# Patient Record
Sex: Male | Born: 1935 | Race: White | Hispanic: No | Smoking: Never smoker
Health system: Southern US, Community
[De-identification: ages and names within clinical notes are randomized; demographics above are authoritative.]

## PROBLEM LIST (undated history)

## (undated) DIAGNOSIS — F5104 Psychophysiologic insomnia: Secondary | ICD-10-CM

## (undated) DIAGNOSIS — H9190 Unspecified hearing loss, unspecified ear: Secondary | ICD-10-CM

## (undated) DIAGNOSIS — L409 Psoriasis, unspecified: Secondary | ICD-10-CM

## (undated) DIAGNOSIS — G629 Polyneuropathy, unspecified: Secondary | ICD-10-CM

## (undated) DIAGNOSIS — R918 Other nonspecific abnormal finding of lung field: Secondary | ICD-10-CM

## (undated) DIAGNOSIS — K219 Gastro-esophageal reflux disease without esophagitis: Secondary | ICD-10-CM

## (undated) DIAGNOSIS — M51379 Other intervertebral disc degeneration, lumbosacral region without mention of lumbar back pain or lower extremity pain: Secondary | ICD-10-CM

## (undated) DIAGNOSIS — K5790 Diverticulosis of intestine, part unspecified, without perforation or abscess without bleeding: Secondary | ICD-10-CM

## (undated) DIAGNOSIS — D509 Iron deficiency anemia, unspecified: Secondary | ICD-10-CM

## (undated) DIAGNOSIS — M5137 Other intervertebral disc degeneration, lumbosacral region: Secondary | ICD-10-CM

## (undated) DIAGNOSIS — M1710 Unilateral primary osteoarthritis, unspecified knee: Secondary | ICD-10-CM

## (undated) DIAGNOSIS — C61 Malignant neoplasm of prostate: Secondary | ICD-10-CM

## (undated) DIAGNOSIS — E785 Hyperlipidemia, unspecified: Secondary | ICD-10-CM

## (undated) DIAGNOSIS — K279 Peptic ulcer, site unspecified, unspecified as acute or chronic, without hemorrhage or perforation: Secondary | ICD-10-CM

## (undated) DIAGNOSIS — E559 Vitamin D deficiency, unspecified: Secondary | ICD-10-CM

## (undated) DIAGNOSIS — E119 Type 2 diabetes mellitus without complications: Secondary | ICD-10-CM

## (undated) DIAGNOSIS — G2581 Restless legs syndrome: Secondary | ICD-10-CM

## (undated) DIAGNOSIS — E7800 Pure hypercholesterolemia, unspecified: Secondary | ICD-10-CM

## (undated) DIAGNOSIS — M519 Unspecified thoracic, thoracolumbar and lumbosacral intervertebral disc disorder: Secondary | ICD-10-CM

## (undated) DIAGNOSIS — G4700 Insomnia, unspecified: Secondary | ICD-10-CM

## (undated) DIAGNOSIS — F419 Anxiety disorder, unspecified: Secondary | ICD-10-CM

## (undated) DIAGNOSIS — M5416 Radiculopathy, lumbar region: Secondary | ICD-10-CM

## (undated) DIAGNOSIS — K2950 Unspecified chronic gastritis without bleeding: Secondary | ICD-10-CM

## (undated) DIAGNOSIS — I10 Essential (primary) hypertension: Secondary | ICD-10-CM

## (undated) DIAGNOSIS — E039 Hypothyroidism, unspecified: Secondary | ICD-10-CM

---
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
## 1978-09-17 HISTORY — PX: HEMORROIDECTOMY: SUR656

## 1997-09-17 HISTORY — PX: CARPAL TUNNEL RELEASE: SHX101

## 1998-02-24 ENCOUNTER — Ambulatory Visit (HOSPITAL_BASED_OUTPATIENT_CLINIC_OR_DEPARTMENT_OTHER): Admission: RE | Admit: 1998-02-24 | Discharge: 1998-02-24 | Payer: Self-pay | Admitting: *Deleted

## 1998-03-25 ENCOUNTER — Ambulatory Visit (HOSPITAL_BASED_OUTPATIENT_CLINIC_OR_DEPARTMENT_OTHER): Admission: RE | Admit: 1998-03-25 | Discharge: 1998-03-25 | Payer: Self-pay | Admitting: *Deleted

## 2003-05-31 ENCOUNTER — Ambulatory Visit: Admission: RE | Admit: 2003-05-31 | Discharge: 2003-07-17 | Payer: Self-pay | Admitting: Radiation Oncology

## 2003-06-10 ENCOUNTER — Encounter: Payer: Self-pay | Admitting: Emergency Medicine

## 2003-06-10 ENCOUNTER — Emergency Department (HOSPITAL_COMMUNITY): Admission: EM | Admit: 2003-06-10 | Discharge: 2003-06-10 | Payer: Self-pay | Admitting: Emergency Medicine

## 2003-07-28 ENCOUNTER — Encounter: Admission: RE | Admit: 2003-07-28 | Discharge: 2003-07-28 | Payer: Self-pay | Admitting: Family Medicine

## 2003-08-24 ENCOUNTER — Encounter: Admission: RE | Admit: 2003-08-24 | Discharge: 2003-08-24 | Payer: Self-pay | Admitting: Family Medicine

## 2003-09-18 HISTORY — PX: PROSTATECTOMY: SHX69

## 2003-09-18 HISTORY — PX: ANKLE SURGERY: SHX546

## 2003-09-24 ENCOUNTER — Encounter (INDEPENDENT_AMBULATORY_CARE_PROVIDER_SITE_OTHER): Payer: Self-pay | Admitting: Specialist

## 2003-09-24 ENCOUNTER — Inpatient Hospital Stay (HOSPITAL_COMMUNITY): Admission: RE | Admit: 2003-09-24 | Discharge: 2003-09-26 | Payer: Self-pay | Admitting: Urology

## 2004-08-16 ENCOUNTER — Ambulatory Visit (HOSPITAL_BASED_OUTPATIENT_CLINIC_OR_DEPARTMENT_OTHER): Admission: RE | Admit: 2004-08-16 | Discharge: 2004-08-16 | Payer: Self-pay | Admitting: Surgery

## 2004-08-16 ENCOUNTER — Ambulatory Visit (HOSPITAL_COMMUNITY): Admission: RE | Admit: 2004-08-16 | Discharge: 2004-08-16 | Payer: Self-pay | Admitting: Surgery

## 2005-09-17 HISTORY — PX: ARTHROSCOPY KNEE W/ DRILLING: SUR92

## 2006-09-17 HISTORY — PX: SQUAMOUS CELL CARCINOMA EXCISION: SHX2433

## 2006-12-16 ENCOUNTER — Encounter: Admission: RE | Admit: 2006-12-16 | Discharge: 2006-12-16 | Payer: Self-pay | Admitting: Family Medicine

## 2007-02-26 ENCOUNTER — Encounter: Admission: RE | Admit: 2007-02-26 | Discharge: 2007-02-26 | Payer: Self-pay | Admitting: Urology

## 2007-02-28 ENCOUNTER — Encounter (INDEPENDENT_AMBULATORY_CARE_PROVIDER_SITE_OTHER): Payer: Self-pay | Admitting: Urology

## 2007-02-28 ENCOUNTER — Ambulatory Visit (HOSPITAL_BASED_OUTPATIENT_CLINIC_OR_DEPARTMENT_OTHER): Admission: RE | Admit: 2007-02-28 | Discharge: 2007-02-28 | Payer: Self-pay | Admitting: Urology

## 2007-05-03 ENCOUNTER — Encounter: Admission: RE | Admit: 2007-05-03 | Discharge: 2007-05-03 | Payer: Self-pay | Admitting: Family Medicine

## 2007-05-09 ENCOUNTER — Encounter: Admission: RE | Admit: 2007-05-09 | Discharge: 2007-05-09 | Payer: Self-pay | Admitting: Family Medicine

## 2007-05-26 ENCOUNTER — Encounter: Admission: RE | Admit: 2007-05-26 | Discharge: 2007-05-26 | Payer: Self-pay | Admitting: Family Medicine

## 2007-10-03 ENCOUNTER — Encounter: Admission: RE | Admit: 2007-10-03 | Discharge: 2007-10-03 | Payer: Self-pay | Admitting: Family Medicine

## 2008-05-05 ENCOUNTER — Ambulatory Visit: Payer: Self-pay | Admitting: Gastroenterology

## 2008-05-18 ENCOUNTER — Ambulatory Visit: Payer: Self-pay | Admitting: Gastroenterology

## 2008-05-29 ENCOUNTER — Emergency Department (HOSPITAL_BASED_OUTPATIENT_CLINIC_OR_DEPARTMENT_OTHER): Admission: EM | Admit: 2008-05-29 | Discharge: 2008-05-29 | Payer: Self-pay | Admitting: Emergency Medicine

## 2010-03-30 ENCOUNTER — Inpatient Hospital Stay (HOSPITAL_COMMUNITY): Admission: AD | Admit: 2010-03-30 | Discharge: 2010-04-02 | Payer: Self-pay

## 2010-12-02 LAB — LIPASE, BLOOD: Lipase: 37 U/L (ref 11–59)

## 2010-12-02 LAB — URINALYSIS, ROUTINE W REFLEX MICROSCOPIC
Bilirubin Urine: NEGATIVE
Glucose, UA: >1000 mg/dL — AB
Hgb urine dipstick: NEGATIVE
Ketones, ur: 15 mg/dL — AB
Leukocytes, UA: NEGATIVE
Nitrite: NEGATIVE
Protein, ur: NEGATIVE mg/dL
Specific Gravity, Urine: 1.025 (ref 1.005–1.030)
Urobilinogen, UA: 1 mg/dL (ref 0.0–1.0)
pH: 6 (ref 5.0–8.0)

## 2010-12-02 LAB — URINE MICROSCOPIC-ADD ON

## 2010-12-02 LAB — FECAL LACTOFERRIN, QUANT: Fecal Lactoferrin: POSITIVE

## 2010-12-02 LAB — CBC
HCT: 36.3 % — ABNORMAL LOW (ref 39.0–52.0)
Hemoglobin: 12.3 g/dL — ABNORMAL LOW (ref 13.0–17.0)
MCH: 31.3 pg (ref 26.0–34.0)
MCH: 31.7 pg (ref 26.0–34.0)
MCHC: 33.7 g/dL (ref 30.0–36.0)
MCHC: 34.3 g/dL (ref 30.0–36.0)
MCV: 92.5 fL (ref 78.0–100.0)
MCV: 92.7 fL (ref 78.0–100.0)
Platelets: 138 10*3/uL — ABNORMAL LOW (ref 150–400)
Platelets: 161 10*3/uL (ref 150–400)
RBC: 3.19 MIL/uL — ABNORMAL LOW (ref 4.22–5.81)
RBC: 3.92 MIL/uL — ABNORMAL LOW (ref 4.22–5.81)
RDW: 12.9 % (ref 11.5–15.5)
RDW: 12.9 % (ref 11.5–15.5)
WBC: 12.3 10*3/uL — ABNORMAL HIGH (ref 4.0–10.5)
WBC: 14.2 10*3/uL — ABNORMAL HIGH (ref 4.0–10.5)
WBC: 8.2 10*3/uL (ref 4.0–10.5)

## 2010-12-02 LAB — GLUCOSE, CAPILLARY
Glucose-Capillary: 171 mg/dL — ABNORMAL HIGH (ref 70–99)
Glucose-Capillary: 195 mg/dL — ABNORMAL HIGH (ref 70–99)
Glucose-Capillary: 216 mg/dL — ABNORMAL HIGH (ref 70–99)
Glucose-Capillary: 221 mg/dL — ABNORMAL HIGH (ref 70–99)
Glucose-Capillary: 230 mg/dL — ABNORMAL HIGH (ref 70–99)
Glucose-Capillary: 260 mg/dL — ABNORMAL HIGH (ref 70–99)
Glucose-Capillary: 271 mg/dL — ABNORMAL HIGH (ref 70–99)

## 2010-12-02 LAB — BASIC METABOLIC PANEL
BUN: 12 mg/dL (ref 6–23)
BUN: 23 mg/dL (ref 6–23)
BUN: 39 mg/dL — ABNORMAL HIGH (ref 6–23)
CO2: 39 mEq/L — ABNORMAL HIGH (ref 19–32)
Calcium: 8.9 mg/dL (ref 8.4–10.5)
Chloride: 85 mEq/L — ABNORMAL LOW (ref 96–112)
Creatinine, Ser: 1.02 mg/dL (ref 0.4–1.5)
Creatinine, Ser: 1.13 mg/dL (ref 0.4–1.5)
Creatinine, Ser: 1.29 mg/dL (ref 0.4–1.5)
GFR calc Af Amer: >60 mL/min (ref >60)
GFR calc Af Amer: >60 mL/min (ref >60)
GFR calc non Af Amer: 55 mL/min — ABNORMAL LOW (ref >60)
GFR calc non Af Amer: >60 mL/min (ref >60)
GFR calc non Af Amer: >60 mL/min (ref >60)
Glucose, Bld: 290 mg/dL — ABNORMAL HIGH (ref 70–99)
Potassium: 3 mEq/L — ABNORMAL LOW (ref 3.5–5.1)
Potassium: 3.6 mEq/L (ref 3.5–5.1)
Sodium: 134 mEq/L — ABNORMAL LOW (ref 135–145)

## 2010-12-02 LAB — HEMOCCULT GUIAC POC 1CARD (OFFICE): Fecal Occult Bld: POSITIVE

## 2010-12-02 LAB — STOOL CULTURE

## 2010-12-02 LAB — GIARDIA/CRYPTOSPORIDIUM SCREEN(EIA)
Cryptosporidium Screen (EIA): NEGATIVE
Giardia Screen - EIA: NEGATIVE

## 2010-12-02 LAB — AMYLASE: Amylase: 45 U/L (ref 0–105)

## 2010-12-03 LAB — CARDIAC PANEL(CRET KIN+CKTOT+MB+TROPI)
CK, MB: 1.3 ng/mL (ref 0.3–4.0)
Relative Index: INVALID (ref 0.0–2.5)
Total CK: 42 U/L (ref 7–232)

## 2010-12-03 LAB — COMPREHENSIVE METABOLIC PANEL
ALT: 17 U/L (ref 0–53)
AST: 21 U/L (ref 0–37)
Albumin: 4 g/dL (ref 3.5–5.2)
Alkaline Phosphatase: 77 U/L (ref 39–117)
BUN: 46 mg/dL — ABNORMAL HIGH (ref 6–23)
CO2: 35 mEq/L — ABNORMAL HIGH (ref 19–32)
Calcium: 9.4 mg/dL (ref 8.4–10.5)
Chloride: 84 mEq/L — ABNORMAL LOW (ref 96–112)
Creatinine, Ser: 1.39 mg/dL (ref 0.4–1.5)
GFR calc Af Amer: >60 mL/min (ref >60)
GFR calc non Af Amer: 50 mL/min — ABNORMAL LOW (ref >60)
Glucose, Bld: 358 mg/dL — ABNORMAL HIGH (ref 70–99)
Potassium: 4 mEq/L (ref 3.5–5.1)
Sodium: 134 mEq/L — ABNORMAL LOW (ref 135–145)
Total Bilirubin: 2.1 mg/dL — ABNORMAL HIGH (ref 0.3–1.2)
Total Protein: 7.2 g/dL (ref 6.0–8.3)

## 2010-12-03 LAB — CBC
HCT: 41.1 % (ref 39.0–52.0)
Hemoglobin: 13.9 g/dL (ref 13.0–17.0)
MCH: 31.7 pg (ref 26.0–34.0)
MCHC: 34 g/dL (ref 30.0–36.0)
MCV: 93.3 fL (ref 78.0–100.0)
Platelets: 171 10*3/uL (ref 150–400)
RBC: 4.4 MIL/uL (ref 4.22–5.81)
RDW: 12.4 % (ref 11.5–15.5)
WBC: 12.4 10*3/uL — ABNORMAL HIGH (ref 4.0–10.5)

## 2010-12-03 LAB — CULTURE, BLOOD (ROUTINE X 2)
Culture: NO GROWTH
Culture: NO GROWTH

## 2010-12-03 LAB — GLUCOSE, CAPILLARY
Glucose-Capillary: 319 mg/dL — ABNORMAL HIGH (ref 70–99)
Glucose-Capillary: 340 mg/dL — ABNORMAL HIGH (ref 70–99)

## 2010-12-03 LAB — DIFFERENTIAL
Basophils Absolute: 0 10*3/uL (ref 0.0–0.1)
Eosinophils Relative: 0 % (ref 0–5)
Lymphocytes Relative: 9 % — ABNORMAL LOW (ref 12–46)
Monocytes Absolute: 1.1 10*3/uL — ABNORMAL HIGH (ref 0.1–1.0)

## 2010-12-03 LAB — HEMOGLOBIN A1C
Hgb A1c MFr Bld: 7.4 % — ABNORMAL HIGH (ref <5.7)
Mean Plasma Glucose: 166 mg/dL — ABNORMAL HIGH (ref <117)

## 2010-12-03 LAB — LIPID PANEL: Cholesterol: 148 mg/dL (ref 0–200)

## 2010-12-03 LAB — LACTIC ACID, PLASMA: Lactic Acid, Venous: 3.2 mmol/L — ABNORMAL HIGH (ref 0.5–2.2)

## 2011-01-30 NOTE — Op Note (Signed)
Roger Hanson, Roger Hanson               ACCOUNT NO.:  [id number]   MEDICAL RECORD NO.:  [id number]          PATIENT TYPE:  AMB   LOCATION:  [facility]                         FACILITY:  [facility]   PHYSICIAN:  Ronald L. Earlene Plater, M.D.  DATE OF BIRTH:  [birth year]   DATE OF PROCEDURE:  02/28/2007  DATE OF DISCHARGE:                               OPERATIVE REPORT   DIAGNOSIS:  Carcinoma in situ of the scrotum.   OPERATIVE PROCEDURE:  Partial scrotectomy with excision of the carcinoma  in situ and frozen section margins.   SURGEON:  Gaynelle Arabian, M.D.   ANESTHESIA:  LMA.   BLOOD LOSS:  Negligible.   TUBES:  None.   COMPLICATIONS:  None.   INDICATION FOR PROCEDURE:  [name] is a very nice 76 year old white  male who has history of prostate cancer.  He presented with a right  scrotal lesion and was biopsied by Dr. Karlyn Agee and found to have  carcinoma in situ.  The lesion was approximately 3.5 cm in diameter, was  well demarcated and we felt that after risks, benefits and alternatives  and excision of this lesion was indicated.   PROCEDURE IN DETAIL:  The patient was placed in the supine position and  after proper LMA anesthesia was prepped and draped with Betadine in  sterile fashion.  Marking pen was marked around lesion encompassing  approximately a 3 mm margin around it.  Incision was made through the  skin down to the dartos tunic and the lesion was excised with full-  thickness skin down to the dartos tunic.  It was subsequently sent to  pathology.  Good hemostasis noted to be present.  The frozen section did  reveal carcinoma in situ and it appeared on frozen section to have  negative margins.  The area was irrigated thoroughly and the wound was  closed vertically.  Subcutaneous  stitches were placed with interrupted 4-0 chromic catgut.  The skin was  closed with a running 4-0 Vicryl suture with interspaced interrupted 4-0  Vicryl suture.  Wounds were dressed with fluffs and scrotal  support.  The patient tolerated procedure well, no complications.  He was taken to  the recovery room stable.      Ronald L. Earlene Plater, M.D.  Electronically Signed     RLD/MEDQ  D:  02/28/2007  T:  02/28/2007  Job:  [id number]   cc:   Garrison [place]. Yetta Barre, M.D.  Fax: [phone]

## 2011-02-02 NOTE — Op Note (Signed)
Roger Hanson, Roger Hanson               ACCOUNT NO.:  [id number]   MEDICAL RECORD NO.:  [id number]          PATIENT TYPE:  AMB   LOCATION:  [facility]                          FACILITY:  [facility]   PHYSICIAN:  Currie Paris, M.D.DATE OF BIRTH:  12/21/1934   DATE OF PROCEDURE:  08/16/2004  DATE OF DISCHARGE:                                 OPERATIVE REPORT   CCS:  #[id number]   PREOPERATIVE DIAGNOSIS:  Left inguinal hernia.   POSTOPERATIVE DIAGNOSIS:  Left inguinal hernia - indirect.   OPERATION:  Repair of left inguinal hernia with mesh.   SURGEON:  Currie Paris, M.D.   ASSISTANT:  Abbott Pao, P.A.-S.   ANESTHESIA:  General (LMA).   INDICATIONS FOR PROCEDURE:  This patient is a 76 year old gentleman who  presented with a symptomatic left inguinal hernia that he desired to have  repaired.   DESCRIPTION OF PROCEDURE:  The patient was seen in the holding area and had  no further questions.  The left inguinal area was marked by the patient and  by me as the operative site.  He was taken to the operating room, and after  satisfactory general LMA anesthesia was obtained, the groin area was  clipped, prepped and draped.  I used 0.25% Marcaine to help with  postoperative analgesia and infiltrated along the skin line and sub-  fascially.  The skin incision was made and deepened to the external oblique  aponeurosis, with additional local infiltration down in the deeper layers.  The external oblique aponeurosis was opened up in line with its fibers and  the cord structures separated off and surrounded with a Penrose drain.  Once I had the floor examined, it appeared intact.  There was a large amount  of pre-peritoneal fat protruding through, which was trimmed off.  The  external oblique was somewhat scarred up very medially and inferiorly from  its prior midline from his prostate surgery, so this was not freed up here.  There was an indirect sac present, which was stripped off the cord,  suture  ligated and amputated and retracted under the deep ring.  Some pre-  peritoneal fat was also amputated.  The deep ring was fairly patulous, but  there were no other structures other than now the cord structures coming  through.  Due to the repair, I took a #2-0 Prolene and tightened the internal ring by  putting a single suture through the transversalis, just medial to the deep  ring.  The mesh patch was then placed overlying, and sutured in with a  running #2-0 Prolene starting medially and working to the level of the deep  ring, and then tacked medially well under the external oblique using more #2-  0 Prolene.  It was split laterally to go around the cord and tacked down.  This produced a nice coverage with a deep ring which was snug but allowed  the cord structures to come through readily.  Everything appeared to be dry.  The external oblique was closed over the repair with #3-0 Vicryl, the  Scarpa's with #3-0 Vicryl, the skin with #4-0  Monocryl subcuticular and  Dermabond.  The patient tolerated the procedure well.  There were no operative  complications.  All counts are correct.      Chri   CJS/MEDQ  D:  08/16/2004  T:  08/16/2004  Job:  [id number]   cc:   Mosetta Putt, M.D.  [address] [place]  [place] [address]  Fax: [phone]

## 2011-02-02 NOTE — H&P (Signed)
NAME:  Roger Hanson, Roger Hanson                         ACCOUNT NO.:  [id number]   MEDICAL RECORD NO.:  [id number]                   PATIENT TYPE:  INP   LOCATION:  X001                                 FACILITY:  [facility]   PHYSICIAN:  Lucrezia Starch. Ovidio Hanger, M.D.           DATE OF BIRTH:  1935-02-23   DATE OF ADMISSION:  09/24/2003  DATE OF DISCHARGE:                                HISTORY & PHYSICAL   CHIEF COMPLAINT:  I have prostate cancer.   HISTORY OF PRESENT ILLNESS:  Mr. Parodi is a very nice 76 year old white  male who was referred by Dr. Mosetta Putt for a nodule in his prostate.  He was evaluated by me and on examination was found to have a firm left  nodule in the left central portion towards the apex of the prostate.  PSA  was noted to be 2.7.  He underwent transrectal ultrasound and biopsy of the  prostate which revealed a Gleason score 6 which was 3+3 adenocarcinoma  involving less than 10% of the biopsies from the left side of the prostate.  He has considered all options carefully.  After understanding risks,  benefits, and alternatives, has elected to proceed with radical retropubic  prostatectomy.   ALLERGIES:  He has no known allergies.   MEDICATIONS:  1. Glipizide.  2. Metformin.  3. Levothyroxine.  4. Cozaar.  5. Lovastatin.  6. Prevacid.   PAST MEDICAL HISTORY:  1. He does have hypertension.  2. Is diabetic.  3. Hypothyroid.  4. He has had a hiatal hernia with reflux.  5. Prostate cancer, as noted.   PAST SURGICAL HISTORY:  1. He has had a herniorrhaphy in 1976.  2. Bilateral carpal tunnel syndrome five years ago.  3. He has obtained 2 units of autologous blood.   SOCIAL HISTORY:  Negative smoker.  Negative drinker.   FAMILY HISTORY:  Not significant.   REVIEW OF SYSTEMS:  He has no shortness of breath, dyspnea on exertion,  chest pain, or GI complaints.   PHYSICAL EXAMINATION:  VITAL SIGNS:  Blood pressure 134/60, pulse 88,  respirations 18,  temperature 97.5 degrees Fahrenheit.  GENERAL:  He is well-nourished, well-developed, well groomed.  HEENT:  Normal.  NECK:  Without masses or thyromegaly.  CHEST:  Normal diaphragmatic motion.  ABDOMEN:  Soft, nontender without mass, organomegaly, or hernias.  HEART:  Normal sinus rhythm without murmurs or gallops.  EXTREMITIES:  Normal.  NEUROLOGIC:  Intact.  SKIN:  Normal.  GENITOURINARY:  Penis, meatus, scrotum, testicle, adnexa, anus, perineum are  normal.  Rectal vault is empty.  Prostate is 30 g.  Firm left central nodule  toward the apex.   IMPRESSION:  Clinically localized adenocarcinoma of the prostate.   PLAN:  Radical retropubic prostatectomy.  Ronald L. Ovidio Hanger, M.D.    RLD/MEDQ  D:  09/24/2003  T:  09/24/2003  Job:  [id number]

## 2011-02-02 NOTE — Op Note (Signed)
NAME:  Roger Hanson, Roger Hanson                         ACCOUNT NO.:  [id number]   MEDICAL RECORD NO.:  [id number]                   PATIENT TYPE:  INP   LOCATION:  X001                                 FACILITY:  [facility]   PHYSICIAN:  Lucrezia Starch. Ovidio Hanger, M.D.           DATE OF BIRTH:  1935/06/10   DATE OF PROCEDURE:  09/24/2003  DATE OF DISCHARGE:                                 OPERATIVE REPORT   DIAGNOSIS:  Adenocarcinoma of the prostate.   OPERATIVE PROCEDURE:  Radical retropubic prostatectomy.   SURGEON:  Lucrezia Starch. Earlene Plater, M.D.   ASSISTANT:  Lindaann Slough, M.D.   ANESTHESIA:  General endotracheal.   ESTIMATED BLOOD LOSS:  400 mL.   TUBES:  22 French 10 mL balloon Foley and large flat Blake drain.   COMPLICATIONS:  None.   INDICATION FOR PROCEDURE:  Roger Hanson is a very nice 76 year old white  male who presented with a prostate nodule.  He underwent transrectal  ultrasound and biopsy of the prostate revealed a Gleason score 6 from the  left nodular area of the prostate that appeared to be very localized.  PSA  was only 2.7.  He has considered all options.  After understanding the  risks, benefits, and alternatives, it was elected to proceed with radical  retropubic prostatectomy.   PROCEDURE IN DETAIL:  The patient was placed in the supine position, after  proper general endotracheal anesthesia was prepped and draped with Betadine  in a sterile fashion.  A 24 French, 30 mL balloon catheter was placed into  the bladder and the bladder was drained.  A lower midline vertical abdominal  incision was made.  Sharp dissection was carried down through the  subcutaneous tissue.  The linea alba was incised in the direction of the  incision.  The rectus muscle bellies were retracted laterally, and the space  of Retzius was entered and explored.  There was no unusual adenopathy noted  and with the very localized Gleason score 6 adenocarcinoma and the very low  PSA, we felt that  lymphadenectomy was probably not indicated.  The prostate  was then isolated.  The superficial dorsal vein of the penis was ligated  proximally and distally with 3-0 chromic catgut and ligated.  Endopelvic  fascia was perforated bilaterally.  The puboprostatic ligaments were sharply  incised.  The deep dorsal vein of the penis was encircled with a McDougal  retractor and ligated with two #1 Vicryl stitches and the back bleeder  utilizing the Stamey device was ligated with a figure-of-eight Vicryl  stitch.  The deep dorsal vein of the penis was then cut sharply with Bovie  coagulation cautery down to the apex of the prostate.  At this point an  ampule of indigo carmine was given IV.  The neurovascular bundles were  dissected from the urethra and the lateral pelvic fascia was taken down from  the prostate to drop the neurovascular bundles posterior  laterally.  The  urethra was then encircled with the McDougal retractor.  An umbilical tape  was placed behind it.  The anterior urethra was incised sharply with a  knife.  The catheter was delivered in the field and the posterior urethra  was excised down to the rectourethralis muscle.  The rectourethralis muscle  fibers were then carefully taken down from the apex of the prostate,  completely cleaning the apex of the prostate but staying external to  Denonvillier's fascia, down posteriorly to the transversalis fascia  overlying the seminal vessels and the ampulla of the vas deferens.  The  lateral pedicles of the prostate were then taken in packets with a right  angle clamp and large Hemoloc clips and taken down serially bilaterally.  The transversalis fascia was then incised over the ampullae of the vas  deferens and the seminal vesicles.  The ampullae of the vas deferens were  clipped with Hemoloc clips and cut, as were the seminal vesicles.  The  bladder neck was then approached, dissected carefully with Bovie coagulation  cautery, and a  mucosal tunnel was created to the prostate to completely  preserve the bladder neck.  A right angle was placed around the posterior  bladder neck.  The bladder neck mucosa was then incised and the specimen  removed and submitted to pathology.  Good hemostasis was noted to be  present.  The mucosa was plicated to the serosa with interrupted 4-0 chromic  catgut at the bladder neck, and one figure-of-eight stitch was placed to  slightly tighten it up distally in a tennis racquet-type fashion.  This  tightened it up to approximately a 0.5 cm diameter.  The urethrovesical  anastomosis was then performed over a 22 French, 10 mL balloon catheter  utilizing UR5 needles and 2-0 chromic catgut.  Stitches were placed in the 5  o'clock, 7 o'clock, 3 o'clock, 9 o'clock, and 12 o'clock positions, and a #1  Prolene was passed through the eyelet of the catheter, passed through the  bladder to serve as a safety stitch.  The bladder was then pulled down to  the urethra, the ties were tied, and the bladder was irrigated and no  leakage was noted, and good hemostasis was noted to be present.  The #1  Prolene was passed through the skin over a button.  A large flat Blake drain  was placed through a left stab incision and sutured in place with chromic  catgut.  Again thorough irrigation was performed, good hemostasis was noted  to be present.  The rectus muscle bellies were approximated in the midline  with interrupted 0 chromic catgut.  The fascia was closed with a running #1  PDS suture, the subcutaneous tissue was irrigated, the skin was closed with  skin staples, and the patient was taken to the recovery room stable.                                               Ronald L. Ovidio Hanger, M.D.    RLD/MEDQ  D:  09/24/2003  T:  09/24/2003  Job:  [id number]

## 2011-02-02 NOTE — Discharge Summary (Signed)
NAME:  Roger Hanson, Roger Hanson                         ACCOUNT NO.:  [id number]   MEDICAL RECORD NO.:  [id number]                   PATIENT TYPE:  INP   LOCATION:  0344                                 [facility]ACILITY[facility]   PHYSICIAN:  Lucrezia Starch. Ovidio Hanger, M.D.           DATE OF BIRTH:  Dec 25, 1934   DATE OF ADMISSION:  09/24/2003  DATE OF DISCHARGE:  09/26/2003                                 DISCHARGE SUMMARY   DIAGNOSIS:  Adenocarcinoma of the prostate.   PROCEDURE:  Radical retropubic prostatectomy on September 24, 2003.   HISTORY OF PRESENT ILLNESS:  Mr. Roger Hanson is a very nice 76 year old white  male who is referred by Dr. Mosetta Putt for a nodule in his prostate. He  was found to have a PSA of 2.7. He underwent biopsy of the prostate which  revealed a Gleason's 4-6, which was 3+3 adenocarcinoma on the left side of  the prostate. After understanding risks, benefits, and alternatives, he has  elected to proceed with radical retropubic prostatectomy.   PAST MEDICAL HISTORY/FAMILY HISTORY/SOCIAL HISTORY/REVIEW OF SYSTEMS:  Please see signed history and physical examination for full details.   PHYSICAL EXAMINATION:  VITAL SIGNS:  He was afebrile. Vital signs stable.  GENERAL:  Well-nourished, well-____________.  HEENT:  Normal.  NECK:  Without masses or thyromegaly.  CHEST:  Has normal diaphragmatic motion.  ABDOMEN:  Soft, nontender. Without masses, organomegaly or hernias.  EXTREMITIES:  Normal.  NEUROLOGIC:  Intact.  SKIN:  Normal.  GENITOURINARY:  Penis, meatus, scrotum, testicle, adnexa, anus and perineum  are normal.  RECTAL:  Prostate is 30 grams, confirmed left central nodule towards the  apex.   HOSPITAL COURSE:  The patient was admitted after undergoing proper  preoperative evaluation. Was subsequently taken to surgery on September 24, 2003 and underwent radical retropubic prostatectomy uneventfully.  Immediately postoperatively, his lab tests were essentially normal. He was  comfortable and tolerating liquids. On postoperative day 1, which was  September 25, 2003 he was afebrile and comfortable. Abdomen was soft.  Hemoglobin was 10.8, hematocrit 32.0. White blood cell count was 8.4. B-met  was essentially normal. His liquids were increased and he was ambulated. He  was subsequently discharged on September 26, 2003, comfortable. CBG was okay.  Abdomen was soft. Wounds were clean. He was tolerating a diet. Flatus was  noted. The Badger Lee drain was discontinued.   DISCHARGE MEDICATIONS:  Include his prior home medications plus Levaquin and  Tylox.   CONDITION ON DISCHARGE:  Improved.   FOLLOW UP:  His pathology was pending. Instructions were given and he was to  see me the following week for staple removal.                                               Ronald L. Earlene Plater  III, M.D.    RLD/MEDQ  D:  10/06/2003  T:  10/07/2003  Job:  [id number]   cc:   Mosetta Putt, M.D.  [address] [place]  [place] [address]  Fax: [phone]

## 2011-05-14 ENCOUNTER — Other Ambulatory Visit: Payer: Self-pay | Admitting: Family Medicine

## 2011-05-14 DIAGNOSIS — M5136 Other intervertebral disc degeneration, lumbar region: Secondary | ICD-10-CM

## 2011-05-16 ENCOUNTER — Other Ambulatory Visit: Payer: Self-pay | Admitting: Family Medicine

## 2011-05-16 DIAGNOSIS — M5136 Other intervertebral disc degeneration, lumbar region: Secondary | ICD-10-CM

## 2011-05-16 DIAGNOSIS — M549 Dorsalgia, unspecified: Secondary | ICD-10-CM

## 2011-05-17 ENCOUNTER — Other Ambulatory Visit: Payer: Self-pay | Admitting: Family Medicine

## 2011-05-17 DIAGNOSIS — R911 Solitary pulmonary nodule: Secondary | ICD-10-CM

## 2011-05-18 ENCOUNTER — Ambulatory Visit
Admission: RE | Admit: 2011-05-18 | Discharge: 2011-05-18 | Disposition: A | Discharge status: Home / Self Care | Payer: Medicare Other | Source: Ambulatory Visit | Attending: Family Medicine | Admitting: Family Medicine

## 2011-05-18 DIAGNOSIS — M5136 Other intervertebral disc degeneration, lumbar region: Secondary | ICD-10-CM

## 2011-05-22 ENCOUNTER — Ambulatory Visit
Admission: RE | Admit: 2011-05-22 | Discharge: 2011-05-22 | Disposition: A | Discharge status: Home / Self Care | Payer: Medicare Other | Source: Ambulatory Visit | Attending: Family Medicine | Admitting: Family Medicine

## 2011-05-22 DIAGNOSIS — M5136 Other intervertebral disc degeneration, lumbar region: Secondary | ICD-10-CM

## 2011-05-22 DIAGNOSIS — M549 Dorsalgia, unspecified: Secondary | ICD-10-CM

## 2011-05-22 MED ORDER — METHYLPREDNISOLONE ACETATE 40 MG/ML INJ SUSP (RADIOLOG
120.0000 mg | Freq: Once | INTRAMUSCULAR | Status: AC
  Administered 2011-05-22: 120 mg via EPIDURAL

## 2011-05-22 MED ORDER — IOHEXOL 180 MG/ML  SOLN
1.0000 mL | Freq: Once | INTRAMUSCULAR | Status: AC | PRN
  Administered 2011-05-22: 1 mL via EPIDURAL

## 2011-05-29 ENCOUNTER — Ambulatory Visit
Admission: RE | Admit: 2011-05-29 | Discharge: 2011-05-29 | Disposition: A | Discharge status: Home / Self Care | Payer: Medicare Other | Source: Ambulatory Visit | Attending: Family Medicine | Admitting: Family Medicine

## 2011-05-29 DIAGNOSIS — R911 Solitary pulmonary nodule: Secondary | ICD-10-CM

## 2011-06-19 ENCOUNTER — Other Ambulatory Visit: Payer: Self-pay | Admitting: Family Medicine

## 2011-06-19 DIAGNOSIS — M5416 Radiculopathy, lumbar region: Secondary | ICD-10-CM

## 2011-06-20 LAB — URINALYSIS, ROUTINE W REFLEX MICROSCOPIC
Bilirubin Urine: NEGATIVE
Glucose, UA: 250 — AB
Ketones, ur: 15 — AB
Specific Gravity, Urine: 1.014
pH: 6

## 2011-06-20 LAB — URINE MICROSCOPIC-ADD ON

## 2011-06-21 ENCOUNTER — Ambulatory Visit
Admission: RE | Admit: 2011-06-21 | Discharge: 2011-06-21 | Disposition: A | Discharge status: Home / Self Care | Payer: Medicare Other | Source: Ambulatory Visit | Attending: Family Medicine | Admitting: Family Medicine

## 2011-06-21 DIAGNOSIS — M5416 Radiculopathy, lumbar region: Secondary | ICD-10-CM

## 2011-06-21 MED ORDER — IOHEXOL 180 MG/ML  SOLN
1.0000 mL | Freq: Once | INTRAMUSCULAR | Status: AC | PRN
  Administered 2011-06-21: 1 mL via EPIDURAL

## 2011-06-21 MED ORDER — METHYLPREDNISOLONE ACETATE 40 MG/ML INJ SUSP (RADIOLOG
120.0000 mg | Freq: Once | INTRAMUSCULAR | Status: AC
  Administered 2011-06-21: 120 mg via EPIDURAL

## 2011-07-05 LAB — URINALYSIS, ROUTINE W REFLEX MICROSCOPIC
Hgb urine dipstick: NEGATIVE
Nitrite: NEGATIVE
Protein, ur: NEGATIVE
Specific Gravity, Urine: 1.029
Urobilinogen, UA: 1

## 2011-07-05 LAB — COMPREHENSIVE METABOLIC PANEL
ALT: 23
AST: 21
Alkaline Phosphatase: 82
CO2: 26
Calcium: 10.1
Chloride: 110
GFR calc Af Amer: 57 — ABNORMAL LOW
GFR calc non Af Amer: 47 — ABNORMAL LOW
Potassium: 4.5
Sodium: 143

## 2011-07-05 LAB — CBC
MCHC: 33.6
RBC: 4.53
WBC: 8.5

## 2011-09-14 ENCOUNTER — Other Ambulatory Visit: Payer: Self-pay | Admitting: Family Medicine

## 2011-09-14 ENCOUNTER — Ambulatory Visit
Admission: RE | Admit: 2011-09-14 | Discharge: 2011-09-14 | Disposition: A | Discharge status: Home / Self Care | Payer: Medicare Other | Source: Ambulatory Visit | Attending: Family Medicine | Admitting: Family Medicine

## 2011-09-14 DIAGNOSIS — M25519 Pain in unspecified shoulder: Secondary | ICD-10-CM

## 2011-09-18 HISTORY — PX: LUMBAR FUSION: SHX111

## 2012-02-04 ENCOUNTER — Ambulatory Visit
Admission: RE | Admit: 2012-02-04 | Discharge: 2012-02-04 | Disposition: A | Discharge status: Home / Self Care | Payer: Medicare Other | Source: Ambulatory Visit | Attending: Urology | Admitting: Urology

## 2012-02-04 ENCOUNTER — Other Ambulatory Visit: Payer: Self-pay | Admitting: Urology

## 2012-02-04 DIAGNOSIS — N3946 Mixed incontinence: Secondary | ICD-10-CM | POA: Insufficient documentation

## 2012-02-04 DIAGNOSIS — C61 Malignant neoplasm of prostate: Secondary | ICD-10-CM | POA: Insufficient documentation

## 2012-05-26 ENCOUNTER — Other Ambulatory Visit: Payer: Self-pay | Admitting: Family Medicine

## 2012-05-26 DIAGNOSIS — R918 Other nonspecific abnormal finding of lung field: Secondary | ICD-10-CM

## 2012-05-28 ENCOUNTER — Ambulatory Visit
Admission: RE | Admit: 2012-05-28 | Discharge: 2012-05-28 | Disposition: A | Discharge status: Home / Self Care | Payer: Medicare Other | Source: Ambulatory Visit | Attending: Family Medicine | Admitting: Family Medicine

## 2012-05-28 DIAGNOSIS — R918 Other nonspecific abnormal finding of lung field: Secondary | ICD-10-CM

## 2013-04-20 ENCOUNTER — Encounter: Payer: Self-pay | Admitting: Gastroenterology

## 2013-08-07 ENCOUNTER — Encounter (HOSPITAL_BASED_OUTPATIENT_CLINIC_OR_DEPARTMENT_OTHER): Payer: Self-pay | Admitting: Emergency Medicine

## 2013-08-07 ENCOUNTER — Inpatient Hospital Stay (HOSPITAL_BASED_OUTPATIENT_CLINIC_OR_DEPARTMENT_OTHER)
Admission: EM | Admit: 2013-08-07 | Discharge: 2013-08-09 | DRG: 392 | Disposition: A | Discharge status: Home / Self Care | Payer: Medicare Other | Attending: Internal Medicine | Admitting: Internal Medicine

## 2013-08-07 ENCOUNTER — Emergency Department (HOSPITAL_BASED_OUTPATIENT_CLINIC_OR_DEPARTMENT_OTHER): Payer: Medicare Other

## 2013-08-07 DIAGNOSIS — K529 Noninfective gastroenteritis and colitis, unspecified: Secondary | ICD-10-CM

## 2013-08-07 DIAGNOSIS — E559 Vitamin D deficiency, unspecified: Secondary | ICD-10-CM | POA: Diagnosis present

## 2013-08-07 DIAGNOSIS — Z87891 Personal history of nicotine dependence: Secondary | ICD-10-CM

## 2013-08-07 DIAGNOSIS — E876 Hypokalemia: Secondary | ICD-10-CM | POA: Diagnosis present

## 2013-08-07 DIAGNOSIS — K5289 Other specified noninfective gastroenteritis and colitis: Secondary | ICD-10-CM

## 2013-08-07 DIAGNOSIS — I959 Hypotension, unspecified: Secondary | ICD-10-CM

## 2013-08-07 DIAGNOSIS — H9190 Unspecified hearing loss, unspecified ear: Secondary | ICD-10-CM | POA: Diagnosis present

## 2013-08-07 DIAGNOSIS — R1110 Vomiting, unspecified: Secondary | ICD-10-CM

## 2013-08-07 DIAGNOSIS — E119 Type 2 diabetes mellitus without complications: Secondary | ICD-10-CM

## 2013-08-07 DIAGNOSIS — I10 Essential (primary) hypertension: Secondary | ICD-10-CM | POA: Diagnosis present

## 2013-08-07 DIAGNOSIS — E873 Alkalosis: Secondary | ICD-10-CM

## 2013-08-07 DIAGNOSIS — G2581 Restless legs syndrome: Secondary | ICD-10-CM | POA: Diagnosis present

## 2013-08-07 DIAGNOSIS — E860 Dehydration: Secondary | ICD-10-CM | POA: Diagnosis present

## 2013-08-07 DIAGNOSIS — A088 Other specified intestinal infections: Principal | ICD-10-CM | POA: Diagnosis present

## 2013-08-07 DIAGNOSIS — E7800 Pure hypercholesterolemia, unspecified: Secondary | ICD-10-CM | POA: Diagnosis present

## 2013-08-07 DIAGNOSIS — M5137 Other intervertebral disc degeneration, lumbosacral region: Secondary | ICD-10-CM | POA: Diagnosis present

## 2013-08-07 DIAGNOSIS — A419 Sepsis, unspecified organism: Secondary | ICD-10-CM

## 2013-08-07 DIAGNOSIS — R7309 Other abnormal glucose: Secondary | ICD-10-CM

## 2013-08-07 DIAGNOSIS — K219 Gastro-esophageal reflux disease without esophagitis: Secondary | ICD-10-CM | POA: Diagnosis present

## 2013-08-07 DIAGNOSIS — R739 Hyperglycemia, unspecified: Secondary | ICD-10-CM

## 2013-08-07 DIAGNOSIS — Z8546 Personal history of malignant neoplasm of prostate: Secondary | ICD-10-CM

## 2013-08-07 DIAGNOSIS — N179 Acute kidney failure, unspecified: Secondary | ICD-10-CM

## 2013-08-07 DIAGNOSIS — E039 Hypothyroidism, unspecified: Secondary | ICD-10-CM | POA: Diagnosis present

## 2013-08-07 DIAGNOSIS — M51379 Other intervertebral disc degeneration, lumbosacral region without mention of lumbar back pain or lower extremity pain: Secondary | ICD-10-CM | POA: Diagnosis present

## 2013-08-07 DIAGNOSIS — D72829 Elevated white blood cell count, unspecified: Secondary | ICD-10-CM

## 2013-08-07 DIAGNOSIS — F411 Generalized anxiety disorder: Secondary | ICD-10-CM | POA: Diagnosis present

## 2013-08-07 DIAGNOSIS — L408 Other psoriasis: Secondary | ICD-10-CM | POA: Diagnosis present

## 2013-08-07 HISTORY — DX: Anxiety disorder, unspecified: F41.9

## 2013-08-07 HISTORY — DX: Gastro-esophageal reflux disease without esophagitis: K21.9

## 2013-08-07 HISTORY — DX: Unspecified hearing loss, unspecified ear: H91.90

## 2013-08-07 HISTORY — DX: Hypothyroidism, unspecified: E03.9

## 2013-08-07 HISTORY — DX: Pure hypercholesterolemia, unspecified: E78.00

## 2013-08-07 HISTORY — DX: Vitamin D deficiency, unspecified: E55.9

## 2013-08-07 HISTORY — DX: Other intervertebral disc degeneration, lumbosacral region: M51.37

## 2013-08-07 HISTORY — DX: Radiculopathy, lumbar region: M54.16

## 2013-08-07 HISTORY — DX: Other nonspecific abnormal finding of lung field: R91.8

## 2013-08-07 HISTORY — DX: Diverticulosis of intestine, part unspecified, without perforation or abscess without bleeding: K57.90

## 2013-08-07 HISTORY — DX: Malignant neoplasm of prostate: C61

## 2013-08-07 HISTORY — DX: Peptic ulcer, site unspecified, unspecified as acute or chronic, without hemorrhage or perforation: K27.9

## 2013-08-07 HISTORY — DX: Insomnia, unspecified: G47.00

## 2013-08-07 HISTORY — DX: Essential (primary) hypertension: I10

## 2013-08-07 HISTORY — DX: Psoriasis, unspecified: L40.9

## 2013-08-07 HISTORY — DX: Type 2 diabetes mellitus without complications: E11.9

## 2013-08-07 HISTORY — DX: Restless legs syndrome: G25.81

## 2013-08-07 HISTORY — DX: Other intervertebral disc degeneration, lumbosacral region without mention of lumbar back pain or lower extremity pain: M51.379

## 2013-08-07 LAB — CBC WITH DIFFERENTIAL/PLATELET
Basophils Absolute: 0 10*3/uL (ref 0.0–0.1)
Basophils Relative: 0 % (ref 0–1)
HCT: 45.5 % (ref 39.0–52.0)
Lymphocytes Relative: 4 % — ABNORMAL LOW (ref 12–46)
MCHC: 33.6 g/dL (ref 30.0–36.0)
Monocytes Absolute: 1.5 10*3/uL — ABNORMAL HIGH (ref 0.1–1.0)
Neutro Abs: 16.6 10*3/uL — ABNORMAL HIGH (ref 1.7–7.7)
Neutrophils Relative %: 88 % — ABNORMAL HIGH (ref 43–77)
Platelets: 175 10*3/uL (ref 150–400)
RDW: 13.4 % (ref 11.5–15.5)
WBC: 18.9 10*3/uL — ABNORMAL HIGH (ref 4.0–10.5)

## 2013-08-07 LAB — POCT I-STAT 3, VENOUS BLOOD GAS (G3P V)
TCO2: 44 mmol/L (ref 0–100)
pCO2, Ven: 54 mmHg — ABNORMAL HIGH (ref 45.0–50.0)
pH, Ven: 7.503 — ABNORMAL HIGH (ref 7.250–7.300)

## 2013-08-07 LAB — COMPREHENSIVE METABOLIC PANEL
ALT: 16 U/L (ref 0–53)
Alkaline Phosphatase: 104 U/L (ref 39–117)
BUN: 54 mg/dL — ABNORMAL HIGH (ref 6–23)
CO2: 37 mEq/L — ABNORMAL HIGH (ref 19–32)
GFR calc Af Amer: 25 mL/min — ABNORMAL LOW (ref >90)
GFR calc non Af Amer: 21 mL/min — ABNORMAL LOW (ref >90)
Glucose, Bld: 653 mg/dL (ref 70–99)
Potassium: 3.7 mEq/L (ref 3.5–5.1)
Sodium: 137 mEq/L (ref 135–145)
Total Bilirubin: 2 mg/dL — ABNORMAL HIGH (ref 0.3–1.2)
Total Protein: 7.6 g/dL (ref 6.0–8.3)

## 2013-08-07 LAB — URINE MICROSCOPIC-ADD ON

## 2013-08-07 LAB — OCCULT BLOOD X 1 CARD TO LAB, STOOL: Fecal Occult Bld: POSITIVE — AB

## 2013-08-07 LAB — URINALYSIS, ROUTINE W REFLEX MICROSCOPIC
Bilirubin Urine: NEGATIVE
Leukocytes, UA: NEGATIVE
Nitrite: NEGATIVE
Specific Gravity, Urine: 1.026 (ref 1.005–1.030)
Urobilinogen, UA: 0.2 mg/dL (ref 0.0–1.0)
pH: 5 (ref 5.0–8.0)

## 2013-08-07 LAB — GLUCOSE, CAPILLARY
Glucose-Capillary: 444 mg/dL — ABNORMAL HIGH (ref 70–99)
Glucose-Capillary: 382 mg/dL — ABNORMAL HIGH (ref 70–99)
Glucose-Capillary: 249 mg/dL — ABNORMAL HIGH (ref 70–99)

## 2013-08-07 LAB — LIPASE, BLOOD: Lipase: 25 U/L (ref 11–59)

## 2013-08-07 MED ORDER — CIPROFLOXACIN IN D5W 400 MG/200ML IV SOLN
400.0000 mg | INTRAVENOUS | Status: DC
  Administered 2013-08-07 – 2013-08-08 (×2): 400 mg via INTRAVENOUS
  Filled 2013-08-07 (×3): qty 200

## 2013-08-07 MED ORDER — CIPROFLOXACIN IN D5W 400 MG/200ML IV SOLN
400.0000 mg | Freq: Two times a day (BID) | INTRAVENOUS | Status: DC
  Filled 2013-08-07 (×2): qty 200

## 2013-08-07 MED ORDER — LEVOTHYROXINE SODIUM 125 MCG PO TABS
125.0000 ug | ORAL_TABLET | Freq: Every day | ORAL | Status: DC
  Administered 2013-08-08 – 2013-08-09 (×2): 125 ug via ORAL
  Filled 2013-08-07 (×3): qty 1

## 2013-08-07 MED ORDER — SODIUM CHLORIDE 0.9 % IV SOLN
INTRAVENOUS | Status: DC
  Administered 2013-08-07: 20:00:00 via INTRAVENOUS
  Administered 2013-08-08: 100 mL/h via INTRAVENOUS
  Administered 2013-08-08: 1000 mL via INTRAVENOUS

## 2013-08-07 MED ORDER — ALPRAZOLAM 0.5 MG PO TABS: 0.5000 mg | ORAL_TABLET | Freq: Every evening | ORAL | Status: DC | PRN

## 2013-08-07 MED ORDER — SODIUM CHLORIDE 0.9 % IV BOLUS (SEPSIS)
1000.0000 mL | Freq: Once | INTRAVENOUS | Status: AC
  Administered 2013-08-07: 1000 mL via INTRAVENOUS

## 2013-08-07 MED ORDER — METRONIDAZOLE IN NACL 5-0.79 MG/ML-% IV SOLN
500.0000 mg | Freq: Three times a day (TID) | INTRAVENOUS | Status: DC
  Administered 2013-08-07 – 2013-08-09 (×5): 500 mg via INTRAVENOUS
  Filled 2013-08-07 (×7): qty 100

## 2013-08-07 MED ORDER — HYDROMORPHONE HCL PF 1 MG/ML IJ SOLN: 0.5000 mg | INTRAMUSCULAR | Status: DC | PRN

## 2013-08-07 MED ORDER — INSULIN REGULAR HUMAN 100 UNIT/ML IJ SOLN
10.0000 [IU] | Freq: Once | INTRAMUSCULAR | Status: AC
  Administered 2013-08-07: 10 [IU] via SUBCUTANEOUS
  Filled 2013-08-07: qty 1

## 2013-08-07 MED ORDER — PIPERACILLIN-TAZOBACTAM 3.375 G IVPB
3.3750 g | Freq: Once | INTRAVENOUS | Status: AC
  Administered 2013-08-07: 3.375 g via INTRAVENOUS
  Filled 2013-08-07: qty 50

## 2013-08-07 MED ORDER — ONDANSETRON HCL 4 MG/2ML IJ SOLN: 4.0000 mg | Freq: Four times a day (QID) | INTRAMUSCULAR | Status: DC | PRN

## 2013-08-07 MED ORDER — ONDANSETRON HCL 4 MG/2ML IJ SOLN: 4.0000 mg | Freq: Three times a day (TID) | INTRAMUSCULAR | Status: DC | PRN

## 2013-08-07 MED ORDER — GABAPENTIN 600 MG PO TABS
300.0000 mg | ORAL_TABLET | Freq: Every day | ORAL | Status: DC
  Administered 2013-08-07: 300 mg via ORAL
  Filled 2013-08-07 (×2): qty 0.5

## 2013-08-07 MED ORDER — SODIUM CHLORIDE 0.9 % IV SOLN
Freq: Once | INTRAVENOUS | Status: AC
  Administered 2013-08-07: 15:00:00 via INTRAVENOUS

## 2013-08-07 MED ORDER — INSULIN ASPART 100 UNIT/ML ~~LOC~~ SOLN
0.0000 [IU] | Freq: Three times a day (TID) | SUBCUTANEOUS | Status: DC
  Administered 2013-08-08: 5 [IU] via SUBCUTANEOUS
  Administered 2013-08-08: 3 [IU] via SUBCUTANEOUS

## 2013-08-07 MED ORDER — HYDROCODONE-ACETAMINOPHEN 5-325 MG PO TABS: 1.0000 | ORAL_TABLET | Freq: Four times a day (QID) | ORAL | Status: DC | PRN

## 2013-08-07 NOTE — ED Notes (Signed)
Pt having N/V/D  X 2 days.  Blood sugar elevated at home.  No known fever.  No abdominal pain.

## 2013-08-07 NOTE — ED Provider Notes (Signed)
I saw and evaluated the patient, reviewed the resident's note and I agree with the findings and plan.  EKG Interpretation   None       Patient is a 78 year-old male who presents the emergency department with nausea, vomiting and diarrhea for the past 2 days. He reports that he has had less than 10 episodes of vomiting has been nonbilious, nonbloody and 4 episodes of nonbloody diarrhea. He states he did have one episode of black, tarry looking stool today. He is not taking Pepto-Bismol and he is not on iron supplements. No prior history of GI bleed. Family reports they have been checking his blood sugar the past several days and his glucose has been greater than 200. He reports that it is normally in the 100s. Today it was greater than 500 in the parotid to the emergency department. Denies a history of DKA. He is on glyburide and metformin. Patient denies any fever, abdominal pain, dysuria or hematuria. No sick contacts, recent travel or hospitalization.  On exam, patient is mildly hypotensive and tachycardic. He is otherwise well-appearing and in no apparent distress. He has dry mucous membranes. Heart and lung sounds are normal. His abdomen is completely soft and nontender to palpation, nondistended. Symptoms of vomiting and diarrhea may have precipitated his hyperglycemia or vice versa. We'll check abdominal labs, urine, give IV fluids and closely monitor his glucose. Given his benign exam and otherwise nontoxic appearance, will hold on CT imaging of his abdomen this time unless his labs are grossly abnormal.  12:00 PM  Pt has leukocytosis of 18.9 with left shift. He also has a hypochloremic metabolic alkalosis likely from his vomiting and diarrhea. We'll continue IV fluids and monitor glucose. Given his leukocytosis, will obtain CT of his abdomen and pelvis. He does have a elevated creatinine level of 2.7. I feel this is likely prerenal secondary to dehydration.  2:28 PM  Pt CT scan shows enteritis.  Likely infectious or inflammatory rather than ischemic. Will add on lactate. We'll give IV Zosyn. Will continue IV hydration. His blood glucose has improved from 596 to 444 after 2 L of IV fluids. Will give 2 units of subcutaneous regular insulin.  Patient will need admission for IV hydration and IV antibiotics.    3:37 PM  Blood pressure has improved once 12/52. Patient has been able to tolerate by mouth.   CRITICAL CARE Performed by: Raelyn Number   Total critical care time: 30 minutes  Critical care time was exclusive of separately billable procedures and treating other patients.  Critical care was necessary to treat or prevent imminent or life-threatening deterioration.  Critical care was time spent personally by me on the following activities: development of treatment plan with patient and/or surrogate as well as nursing, discussions with consultants, evaluation of patient's response to treatment, examination of patient, obtaining history from patient or surrogate, ordering and performing treatments and interventions, ordering and review of laboratory studies, ordering and review of radiographic studies, pulse oximetry and re-evaluation of patient's condition.   Layla Maw Ward, DO 08/07/13 1537

## 2013-08-07 NOTE — H&P (Signed)
[facility] History and Physical  Roger Hanson:[id number] DOB: 1935/08/07 DOA: 08/07/2013  Referring physician: EDP PCP: Marylene Land, MD   Chief Complaint: nausea/vomiting and weakness  HPI: Roger Hanson is a 78 y.o. male with PMH of DM, HTN, presented to [facility] today and was accepted as a direct admit to SDU. He reports that she ate a hotdog on Wednesday and since then started experiencing nausea, vomiting and diarrhea along with abd cramps. This persisted Thursday and Friday, one of episodes of diarrhea had some dark brown contents, which since resolved. No fevers, weakness persisted and hence was seen in ER where his craetinine was elevated, CBG >258, with metabolic alkalosis   Review of Systems: The patient denies anorexia, fever, weight loss,, vision loss, decreased hearing, hoarseness, chest pain, syncope, dyspnea on exertion, peripheral edema, balance deficits, hemoptysis, abdominal pain, melena, hematochezia, severe indigestion/heartburn, hematuria, incontinence, genital sores, muscle weakness, suspicious skin lesions, transient blindness, difficulty walking, depression, unusual weight change, abnormal bleeding, enlarged lymph nodes, angioedema, and breast masses.    Past Medical History  Diagnosis Date  . Diabetes mellitus without complication   . Psoriasis   . Anxiety   . Pulmonary nodules   . DDD (degenerative disc disease), lumbosacral   . Diverticulosis   . Prostate cancer   . Hypertension   . Hypercholesteremia   . Hypothyroidism   . Insomnia   . Lumbar radiculopathy   . GERD (gastroesophageal reflux disease)   . Restless legs syndrome (RLS)   . Vitamin D deficiency disease   . PUD (peptic ulcer disease)   . Hearing loss    Past Surgical History  Procedure Laterality Date  . Hemorroidectomy    . Carpal tunnel release    . Prostatectomy    . Ankle surgery    . Arthroscopy knee w/ drilling    . Lumbar fusion     Social History:   reports that he has quit smoking. He does not have any smokeless tobacco history on file. He reports that he does not drink alcohol or use illicit drugs. Lives at home with spouse  Allergies  Allergen Reactions  . Glipizide Other (See Comments)    Erythema multiforme  . Lovastatin     Erythema multiforme  . Omeprazole Nausea And Vomiting  . Protonix [Pantoprazole Sodium] Nausea Only    No family history on file.  Prior to Admission medications   Medication Sig Start Date End Date Taking? Authorizing Provider  ALPRAZolam Duanne Moron) 0.5 MG tablet Take 0.5 mg by mouth at bedtime as needed for anxiety.   Yes Historical Provider, MD  aspirin 81 MG tablet Take 81 mg by mouth daily.   Yes Historical Provider, MD  cholecalciferol (VITAMIN D) 1000 UNITS tablet Take 2,000 Units by mouth daily.   Yes Historical Provider, MD  esomeprazole (NEXIUM) 40 MG capsule Take 40 mg by mouth daily at 12 noon.   Yes Historical Provider, MD  gabapentin (NEURONTIN) 300 MG capsule Take 300 mg by mouth at bedtime.   Yes Historical Provider, MD  glyBURIDE (DIABETA) 5 MG tablet Take 5 mg by mouth 2 (two) times daily with a meal.   Yes Historical Provider, MD  HYDROcodone-acetaminophen (NORCO/VICODIN) 5-325 MG per tablet Take 1 tablet by mouth every 6 (six) hours as needed for moderate pain.   Yes Historical Provider, MD  levothyroxine (SYNTHROID, LEVOTHROID) 125 MCG tablet Take 125 mcg by mouth daily before breakfast.   Yes Historical Provider, MD  losartan (COZAAR)  100 MG tablet Take 100 mg by mouth daily.   Yes Historical Provider, MD  metFORMIN (GLUCOPHAGE) 500 MG tablet Take by mouth 2 (two) times daily with a meal.   Yes Historical Provider, MD  pravastatin (PRAVACHOL) 40 MG tablet Take 40 mg by mouth daily.   Yes Historical Provider, MD   Physical Exam: Filed Vitals:   08/07/13 1815  BP:   Pulse:   Temp: 98.7 F (37.1 C)  Resp:      General:  AAOx3, no distress  HEENT: PERRLA, EOMI  CVS:  S1S2/RRR  Lungs: CTAB  Abd: soft, NT, ND, BS present  Ext: no edema c/c  Skin no rashes  Neuro: non focal  Psychiatric, appropriate mood and affect  Labs on Admission:  Basic Metabolic Panel:  Recent Labs Lab 08/07/13 1045  NA 137  K 3.7  CL 82*  CO2 37*  GLUCOSE 653*  BUN 54*  CREATININE 2.70*  CALCIUM 10.1   Liver Function Tests:  Recent Labs Lab 08/07/13 1045  AST 16  ALT 16  ALKPHOS 104  BILITOT 2.0*  PROT 7.6  ALBUMIN 4.3    Recent Labs Lab 08/07/13 1045  LIPASE 25   No results found for this basename: AMMONIA,  in the last 168 hours CBC:  Recent Labs Lab 08/07/13 1045  WBC 18.9*  NEUTROABS 16.6*  HGB 15.3  HCT 45.5  MCV 90.3  PLT 175   Cardiac Enzymes: No results found for this basename: CKTOTAL, CKMB, CKMBINDEX, TROPONINI,  in the last 168 hours  BNP (last 3 results) No results found for this basename: PROBNP,  in the last 8760 hours CBG:  Recent Labs Lab 08/07/13 1018 08/07/13 1335 08/07/13 1524 08/07/13 1826  GLUCAP 596* 444* 382* 264*    Radiological Exams on Admission: Ct Abdomen Pelvis Wo Contrast  08/07/2013   CLINICAL DATA:  Nausea, vomiting and diarrhea for 24 hr, history diverticulitis, hypertension, diabetes, prostate cancer, elevated creatinine of 2.7  EXAM: CT ABDOMEN AND PELVIS WITHOUT CONTRAST  TECHNIQUE: Multidetector CT imaging of the abdomen and pelvis was performed following the standard protocol without intravenous contrast. Sagittal and coronal MPR images reconstructed from axial data set. Patient drank dilute oral contrast for exam  COMPARISON:  CT abdomen/pelvis 03/30/2010, CT chest 05/28/2012  FINDINGS: Multiple calcified pulmonary granulomata at lung bases with additional nodules which are not definitely calcified, largest 6 mm diameter left lower lobe, unchanged.  Within limits of a nonenhanced exam no focal abnormalities of the liver, spleen, pancreas, kidneys, or right adrenal gland.  Small left adrenal  nodule 10 x 8 mm image 23 questionably present on previous exam, indeterminate.  Mild thickening of the wall of the proximal small bowel extending from the 2nd portion of the duodenum into the proximal jejunum consistent with enteritis.  Single loop of proximal jejunum is mildly dilated up to 3.5 cm diameter.  No bowel obstruction identified, with oral contrast present to proximal transverse colon.  Sigmoid diverticulosis without evidence of diverticulitis.  Stomach decompressed, unable to accurately assess wall thickness.  Remaining bowel normal appearance.  Right inguinal hernia containing fat.  Unremarkable bladder in ureters.  Normal appendix.  Beam hardening artifacts from orthopedic hardware lower lumbar spine.  No mass, adenopathy, free air or free fluid.  IMPRESSION: Thickened 2nd and 3rd portions the duodenum extending into proximal jejunum compatible with enteritis; differential diagnosis includes infection or inflammatory bowel disease, ischemia less likely.  Granulomatous disease at lung bases.  Sigmoid diverticulosis.  Right inguinal hernia  containing fat.  10 x 8 mm nonspecific left adrenal nodule questionably present on previous exam.   Electronically Signed   By: Ulyses Southward M.D.   On: 08/07/2013 14:15   Assessment/Plan  1. Enteritis -suspect infectious, no h/o IBD -supportive care -IVF, clears, advance as tolerated -CIpro/Flagyl  2. Hyperglycemia  -in setting of infection -SSI for now, normally well controlled on Po regimen -hold oral hypoglycemics -check hbaic  3. ARf -due to volume depletion/ARB -hold ARB -hydrate, monitor urine output and bmet  4. HTN -Bp soft initially, improved now,  IVF, hold antihypertensives  5. Hypothyroidism -continue synthroid  Prophylaxis: SCDS and PPI  Code Status: Full COde Family Communication: d/w daughter at bedside Disposition Plan: home when improved  Time spent:68min  [facility] Pager [phone]  If  7PM-7AM, please contact night-coverage [email or web address] Password [facility] 08/07/2013, 6:53 PM

## 2013-08-07 NOTE — ED Notes (Signed)
MD at bedside. 

## 2013-08-07 NOTE — ED Provider Notes (Signed)
CSN: [id number]     Arrival date & time 08/07/13  2130 History   First MD Initiated Contact with Patient 08/07/13 1004     Chief Complaint  Patient presents with  . Hyperglycemia  . Emesis   (Consider location/radiation/quality/duration/timing/severity/associated sxs/prior Treatment) Patient is a 78 y.o. male presenting with hyperglycemia and vomiting. The history is provided by the patient and a relative.  Hyperglycemia Severity:  Severe Onset quality:  Gradual Duration:  2 days Timing:  Constant Progression:  Worsening Chronicity:  New Diabetes status:  Controlled with oral medications Current diabetic therapy:  Glyburide Time since last antidiabetic medication:  3 hours Context: recent illness   Context: not change in medication, not insulin pump use, not new diabetes diagnosis, not noncompliance and not recent change in diet   Ineffective treatments:  Oral agents Associated symptoms: dehydration, fatigue, increased thirst, nausea, polyuria, vomiting and weakness   Associated symptoms: no abdominal pain, no altered mental status, no chest pain, no confusion, no diaphoresis, no dizziness, no dysuria, no fever, no increased appetite, no malaise, no shortness of breath, no syncope and no weight change   Associated symptoms comment:  Diarrhea with dark stools Emesis Associated symptoms: no abdominal pain      Past Medical History  Diagnosis Date  . Diabetes mellitus without complication   . Psoriasis    No past surgical history on file. No family history on file. History  Substance Use Topics  . Smoking status: Not on file  . Smokeless tobacco: Not on file  . Alcohol Use: Not on file    Review of Systems  Constitutional: Positive for fatigue. Negative for fever and diaphoresis.  Respiratory: Negative for shortness of breath.   Cardiovascular: Negative for chest pain and syncope.  Gastrointestinal: Positive for nausea and vomiting. Negative for abdominal pain.   Endocrine: Positive for polydipsia and polyuria.  Genitourinary: Negative for dysuria.  Neurological: Negative for dizziness.  Psychiatric/Behavioral: Negative for confusion.    Allergies  Glipizide; Lovastatin; Omeprazole; and Protonix  Home Medications   Current Outpatient Rx  Name  Route  Sig  Dispense  Refill  . ALPRAZolam (XANAX) 0.5 MG tablet   Oral   Take 0.5 mg by mouth at bedtime as needed for anxiety.         Marland Kitchen aspirin 81 MG tablet   Oral   Take 81 mg by mouth daily.         . cholecalciferol (VITAMIN D) 1000 UNITS tablet   Oral   Take 2,000 Units by mouth daily.         Marland Kitchen esomeprazole (NEXIUM) 40 MG capsule   Oral   Take 40 mg by mouth daily at 12 noon.         . gabapentin (NEURONTIN) 300 MG capsule   Oral   Take 300 mg by mouth at bedtime.         Marland Kitchen glyBURIDE (DIABETA) 5 MG tablet   Oral   Take 5 mg by mouth 2 (two) times daily with a meal.         . HYDROcodone-acetaminophen (NORCO/VICODIN) 5-325 MG per tablet   Oral   Take 1 tablet by mouth every 6 (six) hours as needed for moderate pain.         Marland Kitchen levothyroxine (SYNTHROID, LEVOTHROID) 125 MCG tablet   Oral   Take 125 mcg by mouth daily before breakfast.         . losartan (COZAAR) 100 MG tablet   Oral  Take 100 mg by mouth daily.         . metFORMIN (GLUCOPHAGE) 500 MG tablet   Oral   Take by mouth 2 (two) times daily with a meal.         . pravastatin (PRAVACHOL) 40 MG tablet   Oral   Take 40 mg by mouth daily.          BP 93/72  Pulse 103  Temp(Src) 97.5 F (36.4 C)  Resp 16  Ht 5' 8.5" (1.74 m)  Wt 165 lb (74.844 kg)  BMI 24.72 kg/m2  SpO2 96% Physical Exam  Constitutional: He is oriented to person, place, and time. He appears well-developed and well-nourished. No distress.  HENT:  Head: Normocephalic.  Mouth/Throat: No oropharyngeal exudate.  Dry mucous membranes  Eyes: Conjunctivae and EOM are normal. Pupils are equal, round, and reactive to light.   Neck: Normal range of motion. Neck supple.  Cardiovascular: Regular rhythm, normal heart sounds and intact distal pulses.  Exam reveals no gallop and no friction rub.   No murmur heard. tachycardic  Pulmonary/Chest: Effort normal. No respiratory distress. He has no wheezes. He has no rales. He exhibits no tenderness.  Abdominal: Bowel sounds are normal. He exhibits no distension. There is no tenderness. There is no rebound and no guarding.  Genitourinary:  No frank blood, brown stool  Neurological: He is alert and oriented to person, place, and time.  Skin: He is not diaphoretic.  Psychiatric: He has a normal mood and affect. His behavior is normal.    ED Course  Procedures (including critical care time) Labs Review Labs Reviewed  GLUCOSE, CAPILLARY - Abnormal; Notable for the following:    Glucose-Capillary 596 (*)    All other components within normal limits  COMPREHENSIVE METABOLIC PANEL - Abnormal; Notable for the following:    Chloride 82 (*)    CO2 37 (*)    Glucose, Bld 653 (*)    BUN 54 (*)    Creatinine, Ser 2.70 (*)    Total Bilirubin 2.0 (*)    GFR calc non Af Amer 21 (*)    GFR calc Af Amer 25 (*)    All other components within normal limits  CBC WITH DIFFERENTIAL - Abnormal; Notable for the following:    WBC 18.9 (*)    Neutrophils Relative % 88 (*)    Neutro Abs 16.6 (*)    Lymphocytes Relative 4 (*)    Monocytes Absolute 1.5 (*)    All other components within normal limits  URINALYSIS, ROUTINE W REFLEX MICROSCOPIC - Abnormal; Notable for the following:    Glucose, UA >1000 (*)    All other components within normal limits  OCCULT BLOOD X 1 CARD TO LAB, STOOL - Abnormal; Notable for the following:    Fecal Occult Bld POSITIVE (*)    All other components within normal limits  GLUCOSE, CAPILLARY - Abnormal; Notable for the following:    Glucose-Capillary 444 (*)    All other components within normal limits  URINE MICROSCOPIC-ADD ON - Abnormal; Notable for  the following:    Casts HYALINE CASTS (*)    All other components within normal limits  GLUCOSE, CAPILLARY - Abnormal; Notable for the following:    Glucose-Capillary 382 (*)    All other components within normal limits  POCT I-STAT 3, BLOOD GAS (G3P V) - Abnormal; Notable for the following:    pH, Ven 7.503 (*)    pCO2, Ven 54.0 (*)  Bicarbonate 42.4 (*)    Acid-Base Excess 16.0 (*)    All other components within normal limits  LIPASE, BLOOD  BLOOD GAS, VENOUS  LACTIC ACID, PLASMA   Imaging Review Ct Abdomen Pelvis Wo Contrast  08/07/2013   CLINICAL DATA:  Nausea, vomiting and diarrhea for 24 hr, history diverticulitis, hypertension, diabetes, prostate cancer, elevated creatinine of 2.7  EXAM: CT ABDOMEN AND PELVIS WITHOUT CONTRAST  TECHNIQUE: Multidetector CT imaging of the abdomen and pelvis was performed following the standard protocol without intravenous contrast. Sagittal and coronal MPR images reconstructed from axial data set. Patient drank dilute oral contrast for exam  COMPARISON:  CT abdomen/pelvis 03/30/2010, CT chest 05/28/2012  FINDINGS: Multiple calcified pulmonary granulomata at lung bases with additional nodules which are not definitely calcified, largest 6 mm diameter left lower lobe, unchanged.  Within limits of a nonenhanced exam no focal abnormalities of the liver, spleen, pancreas, kidneys, or right adrenal gland.  Small left adrenal nodule 10 x 8 mm image 23 questionably present on previous exam, indeterminate.  Mild thickening of the wall of the proximal small bowel extending from the 2nd portion of the duodenum into the proximal jejunum consistent with enteritis.  Single loop of proximal jejunum is mildly dilated up to 3.5 cm diameter.  No bowel obstruction identified, with oral contrast present to proximal transverse colon.  Sigmoid diverticulosis without evidence of diverticulitis.  Stomach decompressed, unable to accurately assess wall thickness.  Remaining bowel  normal appearance.  Right inguinal hernia containing fat.  Unremarkable bladder in ureters.  Normal appendix.  Beam hardening artifacts from orthopedic hardware lower lumbar spine.  No mass, adenopathy, free air or free fluid.  IMPRESSION: Thickened 2nd and 3rd portions the duodenum extending into proximal jejunum compatible with enteritis; differential diagnosis includes infection or inflammatory bowel disease, ischemia less likely.  Granulomatous disease at lung bases.  Sigmoid diverticulosis.  Right inguinal hernia containing fat.  10 x 8 mm nonspecific left adrenal nodule questionably present on previous exam.   Electronically Signed   By: Ulyses Southward M.D.   On: 08/07/2013 14:15    EKG Interpretation   None       MDM   1. Enteritis   2. Vomiting and diarrhea   3. Leukocytosis   4. Metabolic alkalosis   5. Hyperglycemia   6. Hypotension      1. Hyperglycemia in the setting of enteritis, AKI  Patient has hyperglycemia likely resulting from acute viral gastroenteritis. The glucose was 653 with an AG of 17, but elevated bicarb and no ketones in his urine. It appears the patient has a metabolic alkalosis due to vomiting and diarrhea. The patient responded well to 2 L of NS, but glucose remained elevated in the 400's. We then administered 10 U of regular insulin. FOBT was positive and CT demonstrated enteritis. Cr was elevated at 2.7 from a baseline of 1. BUN was also elevated. Likely due to hypovolemia in setting of likely viral GI illness. IM was consulted for admission to [facility] for further management. IM agreed to accept the patient.    Pleas Koch, MD 08/07/13 1529  Pleas Koch, MD 08/07/13 1531

## 2013-08-07 NOTE — Progress Notes (Signed)
MEDICATION RELATED NOTE  Pharmacy Re: Cipro Indication: Renal dose Adjustment  Allergies  Allergen Reactions  . Glipizide Other (See Comments)    Erythema multiforme  . Lovastatin     Erythema multiforme  . Omeprazole Nausea And Vomiting  . Protonix [Pantoprazole Sodium] Nausea Only   Patient Measurements: Height: 5' 8.5" (174 cm) Weight: 165 lb (74.844 kg) IBW/kg (Calculated) : 69.55  Vital Signs: Temp: 98.7 F (37.1 C) (11/21 1815) Temp src: Oral (11/21 1815) BP: 118/55 mmHg (11/21 1815) Pulse Rate: 86 (11/21 1739)  Labs:  Recent Labs  08/07/13 1045  WBC 18.9*  HGB 15.3  HCT 45.5  PLT 175  CREATININE 2.70*  ALBUMIN 4.3  PROT 7.6  AST 16  ALT 16  ALKPHOS 104  BILITOT 2.0*   Estimated Creatinine Clearance: 22.9 ml/min (by C-G formula based on Cr of 2.7). Medications:  Anti-infectives   Start     Dose/Rate Route Frequency Ordered Stop   08/07/13 1902  ciprofloxacin (CIPRO) IVPB 400 mg     400 mg 200 mL/hr over 60 Minutes Intravenous Every 24 hours 08/07/13 1902     08/07/13 1900  ciprofloxacin (CIPRO) IVPB 400 mg  Status:  Discontinued     400 mg 200 mL/hr over 60 Minutes Intravenous Every 12 hours 08/07/13 1858 08/07/13 1902   08/07/13 1900  metroNIDAZOLE (FLAGYL) IVPB 500 mg     500 mg 100 mL/hr over 60 Minutes Intravenous Every 8 hours 08/07/13 1858     08/07/13 1430  piperacillin-tazobactam (ZOSYN) IVPB 3.375 g     3.375 g 12.5 mL/hr over 240 Minutes Intravenous  Once 08/07/13 1427 08/07/13 1615      Assessment: 78yo male ordered Cipro 400mg  IV every 12 hours.  His current renal function reveals a creatinine of 2.7 with an estimated clearance of 35ml/min.  I have dose adjusted his antibiotics for decreased renal clearance.  Please adjust or consult pharmacy for adjustment if his renal function returns to baseline.  Plan: 1.  Change Cipro to 400 mg IV every 24 hours.  Thanks, Rober Minion, PharmD., MS Clinical Pharmacist Pager:   [phone] Thank you for allowing pharmacy to be part of this patients care team. 08/07/2013,7:03 PM

## 2013-08-08 DIAGNOSIS — R1110 Vomiting, unspecified: Secondary | ICD-10-CM

## 2013-08-08 DIAGNOSIS — D72829 Elevated white blood cell count, unspecified: Secondary | ICD-10-CM

## 2013-08-08 DIAGNOSIS — A419 Sepsis, unspecified organism: Secondary | ICD-10-CM

## 2013-08-08 DIAGNOSIS — R197 Diarrhea, unspecified: Secondary | ICD-10-CM

## 2013-08-08 DIAGNOSIS — I959 Hypotension, unspecified: Secondary | ICD-10-CM

## 2013-08-08 DIAGNOSIS — E873 Alkalosis: Secondary | ICD-10-CM

## 2013-08-08 LAB — BASIC METABOLIC PANEL
CO2: 32 mEq/L (ref 19–32)
Calcium: 8.6 mg/dL (ref 8.4–10.5)
Creatinine, Ser: 1.59 mg/dL — ABNORMAL HIGH (ref 0.50–1.35)
GFR calc non Af Amer: 41 mL/min — ABNORMAL LOW (ref >90)
Glucose, Bld: 71 mg/dL (ref 70–99)
Sodium: 140 mEq/L (ref 135–145)

## 2013-08-08 LAB — CBC
Hemoglobin: 12.6 g/dL — ABNORMAL LOW (ref 13.0–17.0)
MCH: 31.2 pg (ref 26.0–34.0)
MCHC: 34.1 g/dL (ref 30.0–36.0)
MCV: 91.6 fL (ref 78.0–100.0)
Platelets: 157 10*3/uL (ref 150–400)
RBC: 4.04 MIL/uL — ABNORMAL LOW (ref 4.22–5.81)
RDW: 13.4 % (ref 11.5–15.5)

## 2013-08-08 LAB — GLUCOSE, CAPILLARY
Glucose-Capillary: 169 mg/dL — ABNORMAL HIGH (ref 70–99)
Glucose-Capillary: 116 mg/dL — ABNORMAL HIGH (ref 70–99)

## 2013-08-08 MED ORDER — POTASSIUM CHLORIDE 10 MEQ/100ML IV SOLN
10.0000 meq | INTRAVENOUS | Status: AC
  Administered 2013-08-08 (×6): 10 meq via INTRAVENOUS
  Filled 2013-08-08 (×4): qty 100

## 2013-08-08 MED ORDER — PANTOPRAZOLE SODIUM 40 MG PO TBEC
80.0000 mg | DELAYED_RELEASE_TABLET | Freq: Every day | ORAL | Status: DC
  Administered 2013-08-08 – 2013-08-09 (×2): 80 mg via ORAL
  Filled 2013-08-08 (×2): qty 2

## 2013-08-08 MED ORDER — POTASSIUM CHLORIDE CRYS ER 20 MEQ PO TBCR
30.0000 meq | EXTENDED_RELEASE_TABLET | ORAL | Status: AC
  Administered 2013-08-08 (×2): 30 meq via ORAL
  Filled 2013-08-08 (×2): qty 1

## 2013-08-08 MED ORDER — GABAPENTIN 300 MG PO CAPS
300.0000 mg | ORAL_CAPSULE | Freq: Every day | ORAL | Status: DC
  Administered 2013-08-08: 300 mg via ORAL
  Filled 2013-08-08 (×2): qty 1

## 2013-08-08 NOTE — Progress Notes (Addendum)
[facility]                                                                                Patient Demographics  Roger Hanson, is a 78 y.o. male, DOB - 1935/04/11, OZD:[id number]  Admit date - 08/07/2013   Admitting Physician Edsel Petrin, DO  Outpatient Primary MD for the patient is Carolyne Fiscal, MD  LOS - 1   Chief Complaint  Patient presents with  . Hyperglycemia  . Emesis        Assessment & Plan  Active Problems:   Enteritis   Acute renal failure   Hyperglycemia   Diabetes mellitus  Sepsis secondary to Enteritis  -Found on CT scan. -Possibly infectious etiology. -Leukocytosis is improving. -Will continue Flagyl and Cipro. -Will advance diet as tolerated.  Hyperglycemia in the setting of type 2 diabetes -in setting of infection, likely reactive -SSI for now, normally well controlled on P regimen  -hold oral hypoglycemics  -check hbaic   Acute kidney injury -Secondary to hypovolemia/ARB  -hold ARB  -Will continue IV fluids, continue to monitor his BMP  HTN  -Currently stable. Patient was hypotensive. Antihypertensive currently on hold.  Hypothyroidism  -continue synthroid  Hypokalemia -Will replete and continue to monitor.  Code Status: Full  Family Communication: None  Disposition Plan: Admitted will likely discharge 08/09/2013 if patient can tolerate diet  Procedures  None  Consults   None  DVT Prophylaxis  SCDs   Lab Results  Component Value Date   PLT 157 08/08/2013    Medications  Scheduled Meds: . ciprofloxacin  400 mg Intravenous Q24H  . gabapentin  300 mg Oral QHS  . insulin aspart  0-15 Units Subcutaneous TID WC  . levothyroxine  125 mcg Oral QAC breakfast  . metronidazole  500 mg Intravenous Q8H  . potassium chloride  10 mEq Intravenous Q1 Hr x 6   Continuous Infusions: . sodium chloride 100 mL/hr (08/08/13 0307)   PRN Meds:.ALPRAZolam, HYDROcodone-acetaminophen, HYDROmorphone (DILAUDID) injection,  ondansetron (ZOFRAN) IV  Antibiotics   Anti-infectives   Start     Dose/Rate Route Frequency Ordered Stop   08/07/13 1902  ciprofloxacin (CIPRO) IVPB 400 mg     400 mg 200 mL/hr over 60 Minutes Intravenous Every 24 hours 08/07/13 1902     08/07/13 1900  ciprofloxacin (CIPRO) IVPB 400 mg  Status:  Discontinued     400 mg 200 mL/hr over 60 Minutes Intravenous Every 12 hours 08/07/13 1858 08/07/13 1902   08/07/13 1900  metroNIDAZOLE (FLAGYL) IVPB 500 mg     500 mg 100 mL/hr over 60 Minutes Intravenous Every 8 hours 08/07/13 1858     08/07/13 1430  piperacillin-tazobactam (ZOSYN) IVPB 3.375 g     3.375 g 12.5 mL/hr over 240 Minutes Intravenous  Once 08/07/13 1427 08/07/13 1615     Time Spent in minutes   30 minutes  Ian Cavey D.O. on 08/08/2013 at 12:03 PM  Between 7am to 7pm - Pager - [phone]  After 7pm go to [email or web address] - password [email or web address]  And look for the night coverage person covering for me after hours  [facility] Office  [phone]    Subjective:  [name] seen and examined today.  Patient states that he no longer has abdominal pain, or diarrhea. He denies any further nausea or vomiting. Patient denies dizziness, chest pain, shortness of breath, abdominal pain, N/V/D/C, new weakness, numbess, tingling.    Objective:   Filed Vitals:   08/08/13 0700 08/08/13 0856 08/08/13 1000 08/08/13 1121  BP: 114/51  102/63 127/59  Pulse: 66  72 79  Temp:  97.9 F (36.6 C)  97.7 F (36.5 C)  TempSrc:  Axillary  Oral  Resp: 21  39 24  Height:      Weight:      SpO2: 93%  96%     Wt Readings from Last 3 Encounters:  08/07/13 74.844 kg (165 lb)     Intake/Output Summary (Last 24 hours) at 08/08/13 1203 Last data filed at 08/08/13 0857  Gross per 24 hour  Intake 3453.33 ml  Output   1650 ml  Net 1803.33 ml    Exam  General: Well developed, well nourished, NAD, appears stated age  HEENT: NCAT, PERRLA, EOMI, Anicteic Sclera, mucous  membranes moist. No pharyngeal erythema or exudates  Neck: Supple, no JVD, no masses  Cardiovascular: S1 S2 auscultated, no rubs, murmurs or gallops. Regular rate and rhythm.  Respiratory: Clear to auscultation bilaterally with equal chest rise  Abdomen: Soft, nontender, nondistended, + bowel sounds  Extremities: warm dry without cyanosis clubbing or edema  Neuro: AAOx3, cranial nerves grossly intact. Strength 5/5 in patient's upper and lower extremities bilaterally  Skin: Without rashes exudates or nodules  Psych: Normal affect and demeanor with intact judgement and insight  Data Review   Micro Results Recent Results (from the past 240 hour(s))  MRSA PCR SCREENING     Status: None   Collection Time    08/07/13  6:31 PM      Result Value Range Status   MRSA by PCR NEGATIVE  NEGATIVE Final   Comment:            The GeneXpert MRSA Assay (FDA     approved for NASAL specimens     only), is one component of a     comprehensive MRSA colonization     surveillance program. It is not     intended to diagnose MRSA     infection nor to guide or     monitor treatment for     MRSA infections.    Radiology Reports Ct Abdomen Pelvis Wo Contrast  08/07/2013   CLINICAL DATA:  Nausea, vomiting and diarrhea for 24 hr, history diverticulitis, hypertension, diabetes, prostate cancer, elevated creatinine of 2.7  EXAM: CT ABDOMEN AND PELVIS WITHOUT CONTRAST  TECHNIQUE: Multidetector CT imaging of the abdomen and pelvis was performed following the standard protocol without intravenous contrast. Sagittal and coronal MPR images reconstructed from axial data set. Patient drank dilute oral contrast for exam  COMPARISON:  CT abdomen/pelvis 03/30/2010, CT chest 05/28/2012  FINDINGS: Multiple calcified pulmonary granulomata at lung bases with additional nodules which are not definitely calcified, largest 6 mm diameter left lower lobe, unchanged.  Within limits of a nonenhanced exam no focal abnormalities  of the liver, spleen, pancreas, kidneys, or right adrenal gland.  Small left adrenal nodule 10 x 8 mm image 23 questionably present on previous exam, indeterminate.  Mild thickening of the wall of the proximal small bowel extending from the 2nd portion of the duodenum into the proximal jejunum consistent with enteritis.  Single loop of proximal jejunum is mildly dilated up to 3.5  cm diameter.  No bowel obstruction identified, with oral contrast present to proximal transverse colon.  Sigmoid diverticulosis without evidence of diverticulitis.  Stomach decompressed, unable to accurately assess wall thickness.  Remaining bowel normal appearance.  Right inguinal hernia containing fat.  Unremarkable bladder in ureters.  Normal appendix.  Beam hardening artifacts from orthopedic hardware lower lumbar spine.  No mass, adenopathy, free air or free fluid.  IMPRESSION: Thickened 2nd and 3rd portions the duodenum extending into proximal jejunum compatible with enteritis; differential diagnosis includes infection or inflammatory bowel disease, ischemia less likely.  Granulomatous disease at lung bases.  Sigmoid diverticulosis.  Right inguinal hernia containing fat.  10 x 8 mm nonspecific left adrenal nodule questionably present on previous exam.   Electronically Signed   By: Ulyses Southward M.D.   On: 08/07/2013 14:15    CBC  Recent Labs Lab 08/07/13 1045 08/08/13 0441  WBC 18.9* 15.8*  HGB 15.3 12.6*  HCT 45.5 37.0*  PLT 175 157  MCV 90.3 91.6  MCH 30.4 31.2  MCHC 33.6 34.1  RDW 13.4 13.4  LYMPHSABS 0.8  --   MONOABS 1.5*  --   EOSABS 0.0  --   BASOSABS 0.0  --     Chemistries   Recent Labs Lab 08/07/13 1045 08/08/13 0441  NA 137 140  K 3.7 2.7*  CL 82* 96  CO2 37* 32  GLUCOSE 653* 71  BUN 54* 42*  CREATININE 2.70* 1.59*  CALCIUM 10.1 8.6  AST 16  --   ALT 16  --   ALKPHOS 104  --   BILITOT 2.0*  --     ------------------------------------------------------------------------------------------------------------------ estimated creatinine clearance is 38.9 ml/min (by C-G formula based on Cr of 1.59). ------------------------------------------------------------------------------------------------------------------ No results found for this basename: HGBA1C,  in the last 72 hours ------------------------------------------------------------------------------------------------------------------ No results found for this basename: CHOL, HDL, LDLCALC, TRIG, CHOLHDL, LDLDIRECT,  in the last 72 hours ------------------------------------------------------------------------------------------------------------------ No results found for this basename: TSH, T4TOTAL, FREET3, T3FREE, THYROIDAB,  in the last 72 hours ------------------------------------------------------------------------------------------------------------------ No results found for this basename: VITAMINB12, FOLATE, FERRITIN, TIBC, IRON, RETICCTPCT,  in the last 72 hours  Coagulation profile No results found for this basename: INR, PROTIME,  in the last 168 hours  No results found for this basename: DDIMER,  in the last 72 hours  Cardiac Enzymes No results found for this basename: CK, CKMB, TROPONINI, MYOGLOBIN,  in the last 168 hours ------------------------------------------------------------------------------------------------------------------ No components found with this basename: POCBNP,

## 2013-08-09 LAB — CBC
HCT: 36.6 % — ABNORMAL LOW (ref 39.0–52.0)
Hemoglobin: 12 g/dL — ABNORMAL LOW (ref 13.0–17.0)
MCH: 30.5 pg (ref 26.0–34.0)
MCHC: 32.8 g/dL (ref 30.0–36.0)
MCV: 92.9 fL (ref 78.0–100.0)
Platelets: 149 10*3/uL — ABNORMAL LOW (ref 150–400)
RBC: 3.94 MIL/uL — ABNORMAL LOW (ref 4.22–5.81)
RDW: 13.3 % (ref 11.5–15.5)
WBC: 7.4 10*3/uL (ref 4.0–10.5)

## 2013-08-09 LAB — BASIC METABOLIC PANEL
BUN: 20 mg/dL (ref 6–23)
CO2: 26 mEq/L (ref 19–32)
Calcium: 8.9 mg/dL (ref 8.4–10.5)
Chloride: 106 mEq/L (ref 96–112)
Creatinine, Ser: 1.12 mg/dL (ref 0.50–1.35)
GFR calc non Af Amer: 62 mL/min — ABNORMAL LOW (ref >90)
Glucose, Bld: 84 mg/dL (ref 70–99)

## 2013-08-09 MED ORDER — METRONIDAZOLE 500 MG PO TABS: 500.0000 mg | ORAL_TABLET | Freq: Three times a day (TID) | ORAL | Status: DC

## 2013-08-09 MED ORDER — CIPROFLOXACIN HCL 500 MG PO TABS: 500.0000 mg | ORAL_TABLET | Freq: Two times a day (BID) | ORAL | Status: DC

## 2013-08-09 NOTE — Discharge Summary (Signed)
Physician Discharge Summary  Roger Hanson:[id number] DOB: August 08, 1935 DOA: 08/07/2013  PCP: Marylene Land, MD  Admit date: 08/07/2013 Discharge date: 08/09/2013  Time spent: 45 minutes  Recommendations for Outpatient Follow-up:  Patient spoke with his primary care physician within one week of discharge. He should have his potassium also checked. Patient is to continue his medications as prescribed.  Discharge Diagnoses:  Active Problems:   Enteritis   Acute renal failure   Hyperglycemia   Diabetes mellitus   Discharge Condition: Stable  Diet recommendation: Heart healthy, carb modified  Filed Weights   08/07/13 1009  Weight: 74.844 kg (165 lb)    History of present illness:  Roger Hanson is a 78 y.o. male with PMH of DM, HTN, presented to [facility] ER today and was accepted as a direct admit to SDU.  He reports that she ate a hotdog on Wednesday and since then started experiencing nausea, vomiting and diarrhea along with abd cramps.  This persisted Thursday and Friday, one of episodes of diarrhea had some dark brown contents, which since resolved. No fevers, weakness persisted and hence was seen in ER where his craetinine was elevated, CBG >563, with metabolic alkalosis  Hospital Course:  This is a 78 year old male history of type 2 diabetes mellitus, hypertension that presented to [facility] with complaints of nausea vomiting and diarrhea along with abdominal cramping. Patient had several episodes since Thursday the last week. Patient denied any hematemesis or melena.  [facility] was transferred [facility]. While in the emergency Department med center, patient was found to have hypotension. He was admitted to step down unit.   CT scan of the abdomen did show enteritis. Patient was started on IV ciprofloxacin as well as Flagyl. He was made n.p.o. upon admission. His diet was advanced slowly. Patient was able to tolerate a full diet.  Patient's nausea vomiting  diarrhea have subsided. Patient was also noted to have metabolic alkalosis with acute kidney injury on admission with a creatinine of 2.7.  This is likely secondary to dehydration.  Patient was placed on IV fluids. His creatinine did improve to 1.12.  Patient also is known to have hypoglycemia in the setting of type 2 diabetes. This was likely elevated in the setting of infection which reactive. Patient was placed on insulin sliding scale his by mouth regimen was held. Upon discharge patient may continue his home regimen for his diabetes management. Patient's hypotension did resolve with fluids. He is currently normotensive. He should continue his home medications for hypertension. Patient was also noted to have some hypokalemia during his hospital course. His primary care physician should follow up with his potassium in one week of discharge. Patient will be discharged to home with ciprofloxacin as well as Flagyl by mouth. He is to followup with his primary care physician on one week of discharge. This was all discussed with the patient he does understand agree to the plan.  Procedures: None  Consultations: None  Discharge Exam: Filed Vitals:   08/09/13 0803  BP: 132/74  Pulse: 77  Temp: 98 F (36.7 C)  Resp:     Exam  General: Well developed, well nourished, NAD, appears stated age  HEENT: NCAT, PERRLA, EOMI, Anicteic Sclera, mucous membranes moist. No pharyngeal erythema or exudates  Neck: Supple, no JVD, no masses  Cardiovascular: S1 S2 auscultated, no rubs, murmurs or gallops. Regular rate and rhythm.  Respiratory: Clear to auscultation bilaterally with equal chest rise  Abdomen: Soft, nontender, nondistended, +  bowel sounds  Extremities: warm dry without cyanosis clubbing or edema  Neuro: AAOx3, cranial nerves grossly intact. Strength 5/5 in patient's upper and lower extremities bilaterally  Skin: Without rashes exudates or nodules  Psych: Normal affect and demeanor with intact  judgement and insight  Discharge Instructions  Discharge Orders   Future Orders Complete By Expires   Diet - low sodium heart healthy  As directed    Discharge instructions  As directed    Comments:     Patient spoke with his primary care physician within one week of discharge. He should have his potassium also checked. Patient is to continue his medications as prescribed.   Increase activity slowly  As directed        Medication List         ALPRAZolam 0.5 MG tablet  Commonly known as:  XANAX  Take 0.5 mg by mouth at bedtime as needed for anxiety.     aspirin 81 MG tablet  Take 81 mg by mouth daily.     chlorthalidone 25 MG tablet  Commonly known as:  HYGROTON  Take 12.5 mg by mouth every morning.     cholecalciferol 1000 UNITS tablet  Commonly known as:  VITAMIN D  Take 2,000 Units by mouth daily.     ciprofloxacin 500 MG tablet  Commonly known as:  CIPRO  Take 1 tablet (500 mg total) by mouth 2 (two) times daily.     esomeprazole 40 MG capsule  Commonly known as:  NEXIUM  Take 40 mg by mouth every morning.     gabapentin 300 MG capsule  Commonly known as:  NEURONTIN  Take 300 mg by mouth at bedtime.     glyBURIDE 5 MG tablet  Commonly known as:  DIABETA  Take 5 mg by mouth 2 (two) times daily with a meal.     HYDROcodone-acetaminophen 5-325 MG per tablet  Commonly known as:  NORCO/VICODIN  Take 1 tablet by mouth every 6 (six) hours as needed for moderate pain.     levothyroxine 125 MCG tablet  Commonly known as:  SYNTHROID, LEVOTHROID  Take 125 mcg by mouth daily before breakfast.     losartan 100 MG tablet  Commonly known as:  COZAAR  Take 100 mg by mouth daily.     metFORMIN 500 MG tablet  Commonly known as:  GLUCOPHAGE  Take 1,000 mg by mouth 2 (two) times daily with a meal.     metroNIDAZOLE 500 MG tablet  Commonly known as:  FLAGYL  Take 1 tablet (500 mg total) by mouth 3 (three) times daily.     pravastatin 40 MG tablet  Commonly known as:   PRAVACHOL  Take 40 mg by mouth every evening.       Allergies  Allergen Reactions  . Glipizide Other (See Comments)    Erythema multiforme  . Lovastatin     Erythema multiforme  . Omeprazole Nausea And Vomiting  . Protonix [Pantoprazole Sodium] Nausea Only       Follow-up Information   Follow up with Marylene Land, MD.   Specialty:  Family Medicine   Contact information:   [place] [id number] [phone]        The results of significant diagnostics from this hospitalization (including imaging, microbiology, ancillary and laboratory) are listed below for reference.    Significant Diagnostic Studies: Ct Abdomen Pelvis Wo Contrast  08/07/2013   CLINICAL DATA:  Nausea, vomiting and diarrhea for 24 hr, history  diverticulitis, hypertension, diabetes, prostate cancer, elevated creatinine of 2.7  EXAM: CT ABDOMEN AND PELVIS WITHOUT CONTRAST  TECHNIQUE: Multidetector CT imaging of the abdomen and pelvis was performed following the standard protocol without intravenous contrast. Sagittal and coronal MPR images reconstructed from axial data set. Patient drank dilute oral contrast for exam  COMPARISON:  CT abdomen/pelvis 03/30/2010, CT chest 05/28/2012  FINDINGS: Multiple calcified pulmonary granulomata at lung bases with additional nodules which are not definitely calcified, largest 6 mm diameter left lower lobe, unchanged.  Within limits of a nonenhanced exam no focal abnormalities of the liver, spleen, pancreas, kidneys, or right adrenal gland.  Small left adrenal nodule 10 x 8 mm image 23 questionably present on previous exam, indeterminate.  Mild thickening of the wall of the proximal small bowel extending from the 2nd portion of the duodenum into the proximal jejunum consistent with enteritis.  Single loop of proximal jejunum is mildly dilated up to 3.5 cm diameter.  No bowel obstruction identified, with oral contrast present to proximal transverse colon.   Sigmoid diverticulosis without evidence of diverticulitis.  Stomach decompressed, unable to accurately assess wall thickness.  Remaining bowel normal appearance.  Right inguinal hernia containing fat.  Unremarkable bladder in ureters.  Normal appendix.  Beam hardening artifacts from orthopedic hardware lower lumbar spine.  No mass, adenopathy, free air or free fluid.  IMPRESSION: Thickened 2nd and 3rd portions the duodenum extending into proximal jejunum compatible with enteritis; differential diagnosis includes infection or inflammatory bowel disease, ischemia less likely.  Granulomatous disease at lung bases.  Sigmoid diverticulosis.  Right inguinal hernia containing fat.  10 x 8 mm nonspecific left adrenal nodule questionably present on previous exam.   Electronically Signed   By: Lavonia Dana M.D.   On: 08/07/2013 14:15    Microbiology: Recent Results (from the past 240 hour(s))  MRSA PCR SCREENING     Status: None   Collection Time    08/07/13  6:31 PM      Result Value Range Status   MRSA by PCR NEGATIVE  NEGATIVE Final   Comment:            The GeneXpert MRSA Assay (FDA     approved for NASAL specimens     only), is one component of a     comprehensive MRSA colonization     surveillance program. It is not     intended to diagnose MRSA     infection nor to guide or     monitor treatment for     MRSA infections.     Labs: Basic Metabolic Panel:  Recent Labs Lab 08/07/13 1045 08/08/13 0441 08/09/13 0445  NA 137 140 142  K 3.7 2.7* 3.6  CL 82* 96 106  CO2 37* 32 26  GLUCOSE 653* 71 84  BUN 54* 42* 20  CREATININE 2.70* 1.59* 1.12  CALCIUM 10.1 8.6 8.9   Liver Function Tests:  Recent Labs Lab 08/07/13 1045  AST 16  ALT 16  ALKPHOS 104  BILITOT 2.0*  PROT 7.6  ALBUMIN 4.3    Recent Labs Lab 08/07/13 1045  LIPASE 25   No results found for this basename: AMMONIA,  in the last 168 hours CBC:  Recent Labs Lab 08/07/13 1045 08/08/13 0441 08/09/13 0445  WBC  18.9* 15.8* 7.4  NEUTROABS 16.6*  --   --   HGB 15.3 12.6* 12.0*  HCT 45.5 37.0* 36.6*  MCV 90.3 91.6 92.9  PLT 175 157 149*   Cardiac Enzymes:  No results found for this basename: CKTOTAL, CKMB, CKMBINDEX, TROPONINI,  in the last 168 hours BNP: BNP (last 3 results) No results found for this basename: PROBNP,  in the last 8760 hours CBG:  Recent Labs Lab 08/08/13 0810 08/08/13 1125 08/08/13 1639 08/08/13 2155 08/09/13 0806  GLUCAP 79 214* 169* 116* 89       Signed:  Tysheem Accardo  [facility] 08/09/2013, 10:02 AM

## 2013-08-10 LAB — CG4 I-STAT (LACTIC ACID): Lactic Acid, Venous: 2.59 mmol/L — ABNORMAL HIGH (ref 0.5–2.2)

## 2015-08-29 ENCOUNTER — Encounter: Payer: Self-pay | Admitting: Gastroenterology

## 2016-11-08 ENCOUNTER — Other Ambulatory Visit: Payer: Self-pay | Admitting: Family Medicine

## 2016-11-08 DIAGNOSIS — R410 Disorientation, unspecified: Secondary | ICD-10-CM

## 2016-11-12 ENCOUNTER — Ambulatory Visit
Admission: RE | Admit: 2016-11-12 | Discharge: 2016-11-12 | Disposition: A | Discharge status: Home / Self Care | Payer: Medicare Other | Source: Ambulatory Visit | Attending: Family Medicine | Admitting: Family Medicine

## 2016-11-12 DIAGNOSIS — R410 Disorientation, unspecified: Secondary | ICD-10-CM

## 2016-11-12 MED ORDER — GADOBENATE DIMEGLUMINE 529 MG/ML IV SOLN
14.0000 mL | Freq: Once | INTRAVENOUS | Status: AC | PRN
  Administered 2016-11-12: 14 mL via INTRAVENOUS

## 2017-08-07 ENCOUNTER — Emergency Department (HOSPITAL_COMMUNITY): Payer: Medicare Other

## 2017-08-07 ENCOUNTER — Inpatient Hospital Stay (HOSPITAL_COMMUNITY)
Admission: EM | Admit: 2017-08-07 | Discharge: 2017-08-12 | DRG: 684 | Disposition: A | Discharge status: Home Health | Payer: Medicare Other | Attending: Internal Medicine | Admitting: Internal Medicine

## 2017-08-07 ENCOUNTER — Other Ambulatory Visit: Payer: Self-pay

## 2017-08-07 ENCOUNTER — Inpatient Hospital Stay (HOSPITAL_COMMUNITY): Payer: Medicare Other

## 2017-08-07 DIAGNOSIS — Z7982 Long term (current) use of aspirin: Secondary | ICD-10-CM

## 2017-08-07 DIAGNOSIS — Z79899 Other long term (current) drug therapy: Secondary | ICD-10-CM

## 2017-08-07 DIAGNOSIS — E039 Hypothyroidism, unspecified: Secondary | ICD-10-CM | POA: Diagnosis present

## 2017-08-07 DIAGNOSIS — E876 Hypokalemia: Secondary | ICD-10-CM | POA: Diagnosis present

## 2017-08-07 DIAGNOSIS — K529 Noninfective gastroenteritis and colitis, unspecified: Secondary | ICD-10-CM | POA: Diagnosis present

## 2017-08-07 DIAGNOSIS — G2581 Restless legs syndrome: Secondary | ICD-10-CM | POA: Diagnosis present

## 2017-08-07 DIAGNOSIS — F0390 Unspecified dementia without behavioral disturbance: Secondary | ICD-10-CM | POA: Diagnosis present

## 2017-08-07 DIAGNOSIS — I10 Essential (primary) hypertension: Secondary | ICD-10-CM | POA: Diagnosis not present

## 2017-08-07 DIAGNOSIS — E7800 Pure hypercholesterolemia, unspecified: Secondary | ICD-10-CM | POA: Diagnosis present

## 2017-08-07 DIAGNOSIS — E1169 Type 2 diabetes mellitus with other specified complication: Secondary | ICD-10-CM

## 2017-08-07 DIAGNOSIS — E11649 Type 2 diabetes mellitus with hypoglycemia without coma: Secondary | ICD-10-CM | POA: Diagnosis not present

## 2017-08-07 DIAGNOSIS — Z79890 Hormone replacement therapy (postmenopausal): Secondary | ICD-10-CM

## 2017-08-07 DIAGNOSIS — N179 Acute kidney failure, unspecified: Principal | ICD-10-CM | POA: Diagnosis present

## 2017-08-07 DIAGNOSIS — E785 Hyperlipidemia, unspecified: Secondary | ICD-10-CM | POA: Diagnosis present

## 2017-08-07 DIAGNOSIS — I9589 Other hypotension: Secondary | ICD-10-CM | POA: Diagnosis present

## 2017-08-07 DIAGNOSIS — E860 Dehydration: Secondary | ICD-10-CM | POA: Diagnosis present

## 2017-08-07 DIAGNOSIS — K219 Gastro-esophageal reflux disease without esophagitis: Secondary | ICD-10-CM | POA: Diagnosis present

## 2017-08-07 DIAGNOSIS — E861 Hypovolemia: Secondary | ICD-10-CM | POA: Diagnosis present

## 2017-08-07 DIAGNOSIS — Z7984 Long term (current) use of oral hypoglycemic drugs: Secondary | ICD-10-CM

## 2017-08-07 DIAGNOSIS — Z8546 Personal history of malignant neoplasm of prostate: Secondary | ICD-10-CM

## 2017-08-07 DIAGNOSIS — Z9079 Acquired absence of other genital organ(s): Secondary | ICD-10-CM | POA: Diagnosis not present

## 2017-08-07 LAB — CBC WITH DIFFERENTIAL/PLATELET
BASOS ABS: 0 10*3/uL (ref 0.0–0.1)
BASOS PCT: 0 %
EOS PCT: 0 %
Eosinophils Absolute: 0 10*3/uL (ref 0.0–0.7)
HCT: 38.1 % — ABNORMAL LOW (ref 39.0–52.0)
Hemoglobin: 13 g/dL (ref 13.0–17.0)
Lymphocytes Relative: 12 %
Lymphs Abs: 2 10*3/uL (ref 0.7–4.0)
MCH: 30.8 pg (ref 26.0–34.0)
MCHC: 34.1 g/dL (ref 30.0–36.0)
MCV: 90.3 fL (ref 78.0–100.0)
MONO ABS: 1.3 10*3/uL — AB (ref 0.1–1.0)
Monocytes Relative: 8 %
Neutro Abs: 12.8 10*3/uL — ABNORMAL HIGH (ref 1.7–7.7)
Neutrophils Relative %: 80 %
PLATELETS: 162 10*3/uL (ref 150–400)
RBC: 4.22 MIL/uL (ref 4.22–5.81)
RDW: 13.3 % (ref 11.5–15.5)
WBC: 16.2 10*3/uL — AB (ref 4.0–10.5)

## 2017-08-07 LAB — COMPREHENSIVE METABOLIC PANEL
ALBUMIN: 3.5 g/dL (ref 3.5–5.0)
ALK PHOS: 91 U/L (ref 38–126)
ALT: 14 U/L — ABNORMAL LOW (ref 17–63)
ANION GAP: 15 (ref 5–15)
AST: 16 U/L (ref 15–41)
BILIRUBIN TOTAL: 2 mg/dL — AB (ref 0.3–1.2)
BUN: 98 mg/dL — AB (ref 6–20)
CALCIUM: 7.4 mg/dL — AB (ref 8.9–10.3)
CO2: 33 mmol/L — ABNORMAL HIGH (ref 22–32)
Chloride: 84 mmol/L — ABNORMAL LOW (ref 101–111)
Creatinine, Ser: 7.35 mg/dL — ABNORMAL HIGH (ref 0.61–1.24)
GFR calc Af Amer: 7 mL/min — ABNORMAL LOW (ref >60)
GFR, EST NON AFRICAN AMERICAN: 6 mL/min — AB (ref >60)
GLUCOSE: 144 mg/dL — AB (ref 65–99)
POTASSIUM: 3.2 mmol/L — AB (ref 3.5–5.1)
Sodium: 132 mmol/L — ABNORMAL LOW (ref 135–145)
TOTAL PROTEIN: 6.2 g/dL — AB (ref 6.5–8.1)

## 2017-08-07 LAB — URINALYSIS, ROUTINE W REFLEX MICROSCOPIC
Bilirubin Urine: NEGATIVE
GLUCOSE, UA: 50 mg/dL — AB
Ketones, ur: NEGATIVE mg/dL
LEUKOCYTES UA: NEGATIVE
NITRITE: NEGATIVE
PH: 5 (ref 5.0–8.0)
PROTEIN: NEGATIVE mg/dL
SPECIFIC GRAVITY, URINE: 1.016 (ref 1.005–1.030)

## 2017-08-07 LAB — RENAL FUNCTION PANEL
ANION GAP: 15 (ref 5–15)
Albumin: 3.4 g/dL — ABNORMAL LOW (ref 3.5–5.0)
BUN: 88 mg/dL — ABNORMAL HIGH (ref 6–20)
CHLORIDE: 94 mmol/L — AB (ref 101–111)
CO2: 29 mmol/L (ref 22–32)
CREATININE: 6.33 mg/dL — AB (ref 0.61–1.24)
Calcium: 7.5 mg/dL — ABNORMAL LOW (ref 8.9–10.3)
GFR, EST AFRICAN AMERICAN: 9 mL/min — AB (ref >60)
GFR, EST NON AFRICAN AMERICAN: 7 mL/min — AB (ref >60)
Glucose, Bld: 62 mg/dL — ABNORMAL LOW (ref 65–99)
Phosphorus: 4.6 mg/dL (ref 2.5–4.6)
Potassium: 3.1 mmol/L — ABNORMAL LOW (ref 3.5–5.1)
Sodium: 138 mmol/L (ref 135–145)

## 2017-08-07 LAB — CBG MONITORING, ED
GLUCOSE-CAPILLARY: 49 mg/dL — AB (ref 65–99)
Glucose-Capillary: 139 mg/dL — ABNORMAL HIGH (ref 65–99)
Glucose-Capillary: 51 mg/dL — ABNORMAL LOW (ref 65–99)

## 2017-08-07 LAB — I-STAT CHEM 8, ED
BUN: 94 mg/dL — AB (ref 6–20)
CHLORIDE: 86 mmol/L — AB (ref 101–111)
CREATININE: 7.2 mg/dL — AB (ref 0.61–1.24)
Calcium, Ion: 0.85 mmol/L — CL (ref 1.15–1.40)
Glucose, Bld: 140 mg/dL — ABNORMAL HIGH (ref 65–99)
HCT: 39 % (ref 39.0–52.0)
HEMOGLOBIN: 13.3 g/dL (ref 13.0–17.0)
POTASSIUM: 3.2 mmol/L — AB (ref 3.5–5.1)
Sodium: 133 mmol/L — ABNORMAL LOW (ref 135–145)
TCO2: 32 mmol/L (ref 22–32)

## 2017-08-07 LAB — I-STAT TROPONIN, ED: Troponin i, poc: 0.03 ng/mL (ref 0.00–0.08)

## 2017-08-07 LAB — I-STAT CG4 LACTIC ACID, ED
LACTIC ACID, VENOUS: 0.78 mmol/L (ref 0.5–1.9)
Lactic Acid, Venous: 1.5 mmol/L (ref 0.5–1.9)

## 2017-08-07 LAB — LACTATE DEHYDROGENASE: LDH: 145 U/L (ref 98–192)

## 2017-08-07 LAB — CK: CK TOTAL: 281 U/L (ref 49–397)

## 2017-08-07 MED ORDER — LIDOCAINE HCL 2 % EX GEL
1.0000 "application " | Freq: Once | CUTANEOUS | Status: AC
  Administered 2017-08-07: 1 via URETHRAL
  Filled 2017-08-07: qty 11

## 2017-08-07 MED ORDER — SODIUM CHLORIDE 0.9 % IV BOLUS (SEPSIS)
1000.0000 mL | Freq: Once | INTRAVENOUS | Status: AC
  Administered 2017-08-07: 1000 mL via INTRAVENOUS

## 2017-08-07 MED ORDER — INSULIN ASPART 100 UNIT/ML ~~LOC~~ SOLN
0.0000 [IU] | Freq: Three times a day (TID) | SUBCUTANEOUS | Status: DC
  Administered 2017-08-08: 5 [IU] via SUBCUTANEOUS
  Filled 2017-08-07: qty 1

## 2017-08-07 MED ORDER — POTASSIUM CHLORIDE CRYS ER 20 MEQ PO TBCR
40.0000 meq | EXTENDED_RELEASE_TABLET | Freq: Once | ORAL | Status: AC
  Administered 2017-08-08: 40 meq via ORAL
  Filled 2017-08-07: qty 2

## 2017-08-07 MED ORDER — SODIUM CHLORIDE 0.9 % IV SOLN
INTRAVENOUS | Status: DC
  Administered 2017-08-08 – 2017-08-12 (×9): via INTRAVENOUS

## 2017-08-07 MED ORDER — SODIUM CHLORIDE 0.9 % IV BOLUS (SEPSIS)
250.0000 mL | Freq: Once | INTRAVENOUS | Status: AC
  Administered 2017-08-07: 250 mL via INTRAVENOUS

## 2017-08-07 NOTE — ED Notes (Signed)
Assigned @2327  room 1329

## 2017-08-07 NOTE — Consult Note (Signed)
Reason for Consult:AKI  Referring Physician: Dr. Peggye Form Roger Hanson is an 82 y.o. male.  HPI: 82 yr male with hx HTN, ^ lipids, DM, Prostate Ca, Psoriasis, PUD, Hepatitis, hypothyroid, GERD, Lumbar radiculopathy, Pulm nodules, present now with weakness, 3d of N,V D.   Still taking meds which includes Celebrex, Losartan. Bs has been up 190s-300s . He does not give much of hx and has no specific complaints.  He denies pain, does know he V x 1 but does not know of D. Family gives Hx.  He has memory issues.  No voiding sx, no hematuria, stones, or FH of renal dz. Cr now 7.35/BUN 98 . BP low on arrival Constitutional: as above Eyes: reading glasses only Ears, nose, mouth, throat, and face: negative Respiratory: negative Cardiovascular: negative Gastrointestinal: as above Genitourinary:negative Integument/breast: negative Hematologic/lymphatic: negative Musculoskeletal:hx R knee surgery, and back surgery, R leg weak and numb Neurological: as above Endocrine: DM, Hypothyroid Allergic/Immunologic: glipizide, Lovastatin, Omeprazole, protonix  Past Medical History:  Diagnosis Date  . Anxiety   . DDD (degenerative disc disease), lumbosacral   . Diabetes mellitus without complication   . Diverticulosis   . GERD (gastroesophageal reflux disease)   . Hearing loss   . Hypercholesteremia   . Hypertension   . Hypothyroidism   . Insomnia   . Lumbar radiculopathy   . Prostate cancer   . Psoriasis   . PUD (peptic ulcer disease)   . Pulmonary nodules   . Restless legs syndrome (RLS)   . Vitamin D deficiency disease      No family history on file.  Social History:  reports that he has quit smoking. He does not have any smokeless tobacco history on file. He reports that he does not drink alcohol or use drugs.  Allergies:  Allergies  Allergen Reactions  . Glipizide Other (See Comments)    Erythema multiforme  . Lovastatin     Erythema multiforme  . Omeprazole Nausea And Vomiting   . Protonix [Pantoprazole Sodium] Nausea Only    Medications: I have reviewed the patient's current medications. Prior to Admission:  (Not in a hospital admission)   Results for orders placed or performed during the hospital encounter of 08/07/17 (from the past 48 hour(s))  Comprehensive metabolic panel     Status: Abnormal   Collection Time: 08/07/17  6:30 PM  Result Value Ref Range   Sodium 132 (L) 135 - 145 mmol/L   Potassium 3.2 (L) 3.5 - 5.1 mmol/L   Chloride 84 (L) 101 - 111 mmol/L   CO2 33 (H) 22 - 32 mmol/L   Glucose, Bld 144 (H) 65 - 99 mg/dL   BUN 98 (H) 6 - 20 mg/dL   Creatinine, Ser 7.35 (H) 0.61 - 1.24 mg/dL   Calcium 7.4 (L) 8.9 - 10.3 mg/dL   Total Protein 6.2 (L) 6.5 - 8.1 g/dL   Albumin 3.5 3.5 - 5.0 g/dL   AST 16 15 - 41 U/L   ALT 14 (L) 17 - 63 U/L   Alkaline Phosphatase 91 38 - 126 U/L   Total Bilirubin 2.0 (H) 0.3 - 1.2 mg/dL   GFR calc non Af Amer 6 (L) >60 mL/min   GFR calc Af Amer 7 (L) >60 mL/min    Comment: (NOTE) The eGFR has been calculated using the CKD EPI equation. This calculation has not been validated in all clinical situations. eGFR's persistently <60 mL/min signify possible Chronic Kidney Disease.    Anion gap 15 5 -  15  CBC WITH DIFFERENTIAL     Status: Abnormal   Collection Time: 08/07/17  6:30 PM  Result Value Ref Range   WBC 16.2 (H) 4.0 - 10.5 K/uL   RBC 4.22 4.22 - 5.81 MIL/uL   Hemoglobin 13.0 13.0 - 17.0 g/dL   HCT 38.1 (L) 39.0 - 52.0 %   MCV 90.3 78.0 - 100.0 fL   MCH 30.8 26.0 - 34.0 pg   MCHC 34.1 30.0 - 36.0 g/dL   RDW 13.3 11.5 - 15.5 %   Platelets 162 150 - 400 K/uL   Neutrophils Relative % 80 %   Neutro Abs 12.8 (H) 1.7 - 7.7 K/uL   Lymphocytes Relative 12 %   Lymphs Abs 2.0 0.7 - 4.0 K/uL   Monocytes Relative 8 %   Monocytes Absolute 1.3 (H) 0.1 - 1.0 K/uL   Eosinophils Relative 0 %   Eosinophils Absolute 0.0 0.0 - 0.7 K/uL   Basophils Relative 0 %   Basophils Absolute 0.0 0.0 - 0.1 K/uL  Urinalysis,  Routine w reflex microscopic     Status: Abnormal   Collection Time: 08/07/17  6:30 PM  Result Value Ref Range   Color, Urine AMBER (A) YELLOW    Comment: BIOCHEMICALS MAY BE AFFECTED BY COLOR   APPearance HAZY (A) CLEAR   Specific Gravity, Urine 1.016 1.005 - 1.030   pH 5.0 5.0 - 8.0   Glucose, UA 50 (A) NEGATIVE mg/dL   Hgb urine dipstick SMALL (A) NEGATIVE   Bilirubin Urine NEGATIVE NEGATIVE   Ketones, ur NEGATIVE NEGATIVE mg/dL   Protein, ur NEGATIVE NEGATIVE mg/dL   Nitrite NEGATIVE NEGATIVE   Leukocytes, UA NEGATIVE NEGATIVE   RBC / HPF 0-5 0 - 5 RBC/hpf   WBC, UA 0-5 0 - 5 WBC/hpf   Bacteria, UA RARE (A) NONE SEEN   Squamous Epithelial / LPF 0-5 (A) NONE SEEN   Mucus PRESENT    Budding Yeast PRESENT    Hyaline Casts, UA PRESENT   I-stat troponin, ED (not at [facility])     Status: None   Collection Time: 08/07/17  6:48 PM  Result Value Ref Range   Troponin i, poc 0.03 0.00 - 0.08 ng/mL   Comment 3            Comment: Due to the release kinetics of cTnI, a negative result within the first hours of the onset of symptoms does not rule out myocardial infarction with certainty. If myocardial infarction is still suspected, repeat the test at appropriate intervals.   I-stat Chem 8, ED     Status: Abnormal   Collection Time: 08/07/17  6:50 PM  Result Value Ref Range   Sodium 133 (L) 135 - 145 mmol/L   Potassium 3.2 (L) 3.5 - 5.1 mmol/L   Chloride 86 (L) 101 - 111 mmol/L   BUN 94 (H) 6 - 20 mg/dL   Creatinine, Ser 7.20 (H) 0.61 - 1.24 mg/dL   Glucose, Bld 140 (H) 65 - 99 mg/dL   Calcium, Ion 0.85 (LL) 1.15 - 1.40 mmol/L   TCO2 32 22 - 32 mmol/L   Hemoglobin 13.3 13.0 - 17.0 g/dL   HCT 39.0 39.0 - 52.0 %   Comment NOTIFIED PHYSICIAN   I-Stat CG4 Lactic Acid, ED  (not at  [facility])     Status: None   Collection Time: 08/07/17  6:51 PM  Result Value Ref Range   Lactic Acid, Venous 1.50 0.5 - 1.9 mmol/L    Dg  Chest 2 View  Result Date: 08/07/2017 CLINICAL DATA:   Weakness, nausea, vomiting and diarrhea x1 week. EXAM: CHEST  2 VIEW COMPARISON:  None. FINDINGS: The heart size and mediastinal contours are within normal limits. Minimal aortic atherosclerosis without aneurysm. Both lungs are clear. Osteoarthritis with undersurface spurring is noted about the [facility] joints right greater than left with slightly high riding humeral heads bilaterally that may reflect chronic rotator cuff tears. IMPRESSION: No active cardiopulmonary disease. Electronically Signed   By: Ashley Royalty M.D.   On: 08/07/2017 17:52   Ct Renal Stone Study  Result Date: 08/07/2017 CLINICAL DATA:  Five check, vomiting and diarrhea for 1 week. EXAM: CT ABDOMEN AND PELVIS WITHOUT CONTRAST TECHNIQUE: Multidetector CT imaging of the abdomen and pelvis was performed following the standard protocol without IV contrast. COMPARISON:  08/07/2013 FINDINGS: Lower chest: The lung bases are clear of acute process. No pleural effusion or pulmonary lesions. The heart is normal in size. No pericardial effusion. The distal esophagus and aorta are unremarkable. Hepatobiliary: No focal hepatic lesions or intrahepatic biliary dilatation. The gallbladder is normal. No common bile duct dilatation. Pancreas: No mass, inflammation or ductal dilatation. Spleen: Normal size.  No focal lesions. Adrenals/Urinary Tract: The adrenal glands and kidneys are unremarkable. No renal, ureteral or bladder calculi or mass. Stomach/Bowel: The stomach, duodenum, small bowel and colon are unremarkable. No acute inflammatory changes, mass lesions or obstructive findings. The terminal ileum is normal. The appendix is normal. Moderate descending and sigmoid colon diverticulosis but no findings for acute diverticulitis. Vascular/Lymphatic: Moderate to advanced atherosclerotic calcifications involving the aorta and iliac arteries. No mesenteric or retroperitoneal mass or adenopathy. Reproductive: The prostate gland is surgically absent. Other: No pelvic  mass or adenopathy. No free pelvic fluid collections. No inguinal mass or adenopathy. No abdominal wall hernia or subcutaneous lesions. Musculoskeletal: No acute bony findings. There are a few small stable sclerotic pelvic lesions. Prior RF lumbar surgery changes noted with laminectomies and fixation heart wire. The the L3 pedicle screws are loose. IMPRESSION: 1. No acute abdominal/pelvic findings, mass lesions or adenopathy. 2. No findings for inflammatory bowel process or obstruction. 3. Advanced atherosclerotic calcifications involving the aorta and iliac arteries. 4. Status post prostatectomy. There are few small scattered sclerotic bone lesions which are stable. 5. Loose pedicle screws are noted at L3. Electronically Signed   By: Marijo Sanes M.D.   On: 08/07/2017 20:41    ROS Blood pressure (!) 91/53, pulse 73, temperature 98 F (36.7 C), temperature source Rectal, resp. rate 18, height 5' 8"  (1.727 m), weight 74.8 kg (165 lb), SpO2 100 %. Physical Exam Physical Examination: General appearance - alert NAD,  Mental status - inappropriate safety awareness, Ox2 Eyes - pupils equal and reactive, extraocular eye movements intact, funduscopic exam normal, discs flat and sharp Ears - TMs scarred Mouth - dental hygiene poor Neck - adenopathy noted PCL Lymphatics - posterior cervical nodes Chest - clear to auscultation, no wheezes, rales or rhonchi, symmetric air entry Heart - S1 and S2 normal, systolic murmur BC4/8 at 2nd left intercostal space Abdomen - soft, nontender. Liver down 3 cm Musculoskeletal - no joint tenderness, deformity or swelling Extremities - no pedal edema noted, DP tr Skin - actinic changes in sun exposed areas,   Assessment/Plan: 1 Elevated S Cr  Assume some acute component. But no other recent data to support that.  Backround of low bps with ARB, NSAID which inhibit response to vol depletion. K, and bicarb ok, suspect from effects  of, V, D.  Cannot R/o Subacute process from  NSAIDS, ARB, AIN from this or PPI.    Now appears vol deplete and 1st tx tis to replete and see response.   2 Dementia 3 Hypertension: low now 4. DJD 5. NVD does not appear HUS or other cause of immune mediated renal injury 6 DM P IVF, diet, urine chem, u/s, CK  Roger Hanson 08/07/2017, 10:06 PM

## 2017-08-07 NOTE — ED Notes (Signed)
[place] in room  Urine to be collected  cbg to be collected  Blood to be collected when procedure finished

## 2017-08-07 NOTE — ED Notes (Signed)
Meal given ( OJ  Juice) + ( [place] [employer])

## 2017-08-07 NOTE — ED Notes (Signed)
I stat chem 8 given to MD and RN.

## 2017-08-07 NOTE — ED Provider Notes (Signed)
[facility] Provider Note   CSN: [id number] Arrival date & time: 08/07/17  1653     History   Chief Complaint Chief Complaint  Patient presents with  . Hypotension    HPI Roger Hanson is a 82 y.o. male.  HPI   Presents with concern for generalized weakness.  Reports he has had nausea, vomiting and diarrhea over the last week. Is a poor historian and not sure of exact timing. Estimates 3 episodes of emesis per day, reports he has been unable to eat much.  He reports he is not sure about diarrhea. Has not looked at it but does not suspect black or bloody stool.   No fevers, no dysuria, no cough.  Reports he has not had urge to urinate for the last 24 hours.  Denies difficulty urinating.   Daughter reports he has some chronic nausea, vomiting and diarrhea which is mild, however on Monday, for about 24 hours had severe n/v/d likely more than 10 times at least.  Son also had diarrhea.  No other fevers or concerns.  Missed his medications on Sunday, and Monday due to forgetting then not able to tolerate po. Today very weak at home, not getting up, not eating or drinking so daughter encouraged him to come.   Past Medical History:  Diagnosis Date  . Anxiety   . DDD (degenerative disc disease), lumbosacral   . Diabetes mellitus without complication ([place])   . Diverticulosis   . GERD (gastroesophageal reflux disease)   . Hearing loss   . Hypercholesteremia   . Hypertension   . Hypothyroidism   . Insomnia   . Lumbar radiculopathy   . Prostate cancer ([place])   . Psoriasis   . PUD (peptic ulcer disease)   . Pulmonary nodules   . Restless legs syndrome (RLS)   . Vitamin D deficiency disease     Patient Active Problem List   Diagnosis Date Noted  . AKI (acute kidney injury) ([place]) 08/07/2017  . Enteritis 08/07/2013  . Acute renal failure ([place]) 08/07/2013  . Hyperglycemia 08/07/2013  . Diabetes mellitus ([place]) 08/07/2013    Past  Surgical History:  Procedure Laterality Date  . ANKLE SURGERY    . ARTHROSCOPY KNEE W/ DRILLING    . CARPAL TUNNEL RELEASE    . HEMORROIDECTOMY    . LUMBAR FUSION    . PROSTATECTOMY         Home Medications    Prior to Admission medications   Medication Sig Start Date End Date Taking? Authorizing Provider  ALPRAZolam Duanne Moron) 0.5 MG tablet Take 0.5 mg by mouth at bedtime as needed for anxiety.   Yes [provider]  aspirin 81 MG tablet Take 81 mg by mouth daily.   Yes [provider]  celecoxib (CELEBREX) 200 MG capsule Take 200 mg by mouth daily. 06/27/17  Yes [provider]  cholecalciferol (VITAMIN D) 1000 UNITS tablet Take 2,000 Units by mouth daily.   Yes [provider]  ESOMEPRAZOLE MAGNESIUM PO Take 22.3 mg by mouth every morning.    Yes [provider]  gabapentin (NEURONTIN) 300 MG capsule Take 300 mg by mouth at bedtime.   Yes [provider]  glimepiride (AMARYL) 4 MG tablet Take 4 mg by mouth 2 (two) times daily.   Yes [provider]  hydrochlorothiazide (HYDRODIURIL) 25 MG tablet Take 12.5 mg by mouth daily. 07/25/17  Yes [provider]  Levothyroxine Sodium 100 MCG CAPS Take 100  mcg by mouth daily before breakfast.    Yes [provider]  losartan (COZAAR) 100 MG tablet Take 100 mg by mouth daily.   Yes [provider]  ondansetron (ZOFRAN) 4 MG tablet Take 4 mg by mouth 2 (two) times daily as needed for nausea or vomiting.   Yes [provider]  pravastatin (PRAVACHOL) 80 MG tablet Take 80 mg by mouth every evening.    Yes [provider]  TRADJENTA 5 MG TABS tablet Take 5 mg by mouth every morning. 07/24/17  Yes [provider]  ciprofloxacin (CIPRO) 500 MG tablet Take 1 tablet (500 mg total) by mouth 2 (two) times daily. Patient not taking: Reported on 08/07/2017 08/09/13   Cristal Ford, DO  glyBURIDE (DIABETA) 5 MG tablet Take 5 mg by mouth 2  (two) times daily with a meal.    [provider]  HYDROcodone-acetaminophen (NORCO/VICODIN) 5-325 MG per tablet Take 1 tablet by mouth every 6 (six) hours as needed for moderate pain.    [provider]  metroNIDAZOLE (FLAGYL) 500 MG tablet Take 1 tablet (500 mg total) by mouth 3 (three) times daily. Patient not taking: Reported on 08/07/2017 08/09/13   Cristal Ford, DO    Family History History reviewed. No pertinent family history.  Social History Social History   Tobacco Use  . Smoking status: Former Research scientist (life sciences)  . Smokeless tobacco: Never Used  Substance Use Topics  . Alcohol use: No  . Drug use: No     Allergies   Glipizide; Lovastatin; Omeprazole; and Protonix [pantoprazole sodium]   Review of Systems Review of Systems  Constitutional: Positive for appetite change and fatigue. Negative for fever.  HENT: Negative for sore throat.   Eyes: Negative for visual disturbance.  Respiratory: Negative for shortness of breath.   Cardiovascular: Negative for chest pain.  Gastrointestinal: Positive for diarrhea, nausea and vomiting. Negative for abdominal pain.  Genitourinary: Negative for difficulty urinating.  Musculoskeletal: Negative for back pain and neck stiffness.  Skin: Negative for rash.  Neurological: Negative for syncope and headaches.     Physical Exam Updated Vital Signs BP 119/62 (BP Location: Right Arm)   Pulse 72   Temp 98.2 F (36.8 C) (Oral)   Resp 17   Ht 5\' 8"  (1.727 m)   Wt 74.8 kg (164 lb 14.5 oz)   SpO2 97%   BMI 25.07 kg/m   Physical Exam  Constitutional: He is oriented to person, place, and time. He appears well-developed and well-nourished. No distress.  HENT:  Head: Normocephalic and atraumatic.  Eyes: Conjunctivae and EOM are normal.  Neck: Normal range of motion.  Cardiovascular: Normal rate, regular rhythm, normal heart sounds and intact distal pulses. Exam reveals no gallop and no friction rub.  No murmur  heard. Pulmonary/Chest: Effort normal and breath sounds normal. No respiratory distress. He has no wheezes. He has no rales.  Abdominal: Soft. He exhibits no distension. There is no tenderness. There is no guarding.  Musculoskeletal: He exhibits no edema.  Neurological: He is alert and oriented to person, place, and time.  Skin: Skin is warm and dry. He is not diaphoretic.  Nursing note and vitals reviewed.    ED Treatments / Results  Labs (all labs ordered are listed, but only abnormal results are displayed) Labs Reviewed  COMPREHENSIVE METABOLIC PANEL - Abnormal; Notable for the following components:      Result Value   Sodium 132 (*)    Potassium 3.2 (*)    Chloride  84 (*)    CO2 33 (*)    Glucose, Bld 144 (*)    BUN 98 (*)    Creatinine, Ser 7.35 (*)    Calcium 7.4 (*)    Total Protein 6.2 (*)    ALT 14 (*)    Total Bilirubin 2.0 (*)    GFR calc non Af Amer 6 (*)    GFR calc Af Amer 7 (*)    All other components within normal limits  CBC WITH DIFFERENTIAL/PLATELET - Abnormal; Notable for the following components:   WBC 16.2 (*)    HCT 38.1 (*)    Neutro Abs 12.8 (*)    Monocytes Absolute 1.3 (*)    All other components within normal limits  URINALYSIS, ROUTINE W REFLEX MICROSCOPIC - Abnormal; Notable for the following components:   Color, Urine AMBER (*)    APPearance HAZY (*)    Glucose, UA 50 (*)    Hgb urine dipstick SMALL (*)    Bacteria, UA RARE (*)    Squamous Epithelial / LPF 0-5 (*)    All other components within normal limits  RENAL FUNCTION PANEL - Abnormal; Notable for the following components:   Potassium 3.1 (*)    Chloride 94 (*)    Glucose, Bld 62 (*)    BUN 88 (*)    Creatinine, Ser 6.33 (*)    Calcium 7.5 (*)    Albumin 3.4 (*)    GFR calc non Af Amer 7 (*)    GFR calc Af Amer 9 (*)    All other components within normal limits  HEMOGLOBIN A1C - Abnormal; Notable for the following components:   Hgb A1c MFr Bld 7.2 (*)    All other  components within normal limits  CREATININE, SERUM - Abnormal; Notable for the following components:   Creatinine, Ser 5.75 (*)    GFR calc non Af Amer 8 (*)    GFR calc Af Amer 10 (*)    All other components within normal limits  CBC - Abnormal; Notable for the following components:   WBC 11.8 (*)    RBC 4.18 (*)    Hemoglobin 12.8 (*)    HCT 38.3 (*)    Platelets 136 (*)    All other components within normal limits  COMPREHENSIVE METABOLIC PANEL - Abnormal; Notable for the following components:   Potassium 3.4 (*)    Chloride 96 (*)    Glucose, Bld 192 (*)    BUN 84 (*)    Creatinine, Ser 5.66 (*)    Calcium 7.6 (*)    Total Protein 5.7 (*)    Albumin 3.2 (*)    ALT 13 (*)    Total Bilirubin 1.6 (*)    GFR calc non Af Amer 8 (*)    GFR calc Af Amer 10 (*)    All other components within normal limits  GLUCOSE, CAPILLARY - Abnormal; Notable for the following components:   Glucose-Capillary 35 (*)    All other components within normal limits  GLUCOSE, CAPILLARY - Abnormal; Notable for the following components:   Glucose-Capillary 105 (*)    All other components within normal limits  GLUCOSE, CAPILLARY - Abnormal; Notable for the following components:   Glucose-Capillary 120 (*)    All other components within normal limits  I-STAT CHEM 8, ED - Abnormal; Notable for the following components:   Sodium 133 (*)    Potassium 3.2 (*)    Chloride 86 (*)    BUN  94 (*)    Creatinine, Ser 7.20 (*)    Glucose, Bld 140 (*)    Calcium, Ion 0.85 (*)    All other components within normal limits  CBG MONITORING, ED - Abnormal; Notable for the following components:   Glucose-Capillary 139 (*)    All other components within normal limits  CBG MONITORING, ED - Abnormal; Notable for the following components:   Glucose-Capillary 49 (*)    All other components within normal limits  CBG MONITORING, ED - Abnormal; Notable for the following components:   Glucose-Capillary 51 (*)    All  other components within normal limits  CBG MONITORING, ED - Abnormal; Notable for the following components:   Glucose-Capillary 170 (*)    All other components within normal limits  CBG MONITORING, ED - Abnormal; Notable for the following components:   Glucose-Capillary 294 (*)    All other components within normal limits  CBG MONITORING, ED - Abnormal; Notable for the following components:   Glucose-Capillary 263 (*)    All other components within normal limits  URINE CULTURE  CK  SODIUM, URINE, RANDOM  CREATININE, URINE, RANDOM  LACTATE DEHYDROGENASE  GLUCOSE, CAPILLARY  GLUCOSE, CAPILLARY  HEPATITIS B SURFACE ANTIGEN  HEPATITIS C ANTIBODY (REFLEX)  PARATHYROID HORMONE, INTACT (NO CA)  I-STAT CG4 LACTIC ACID, ED  I-STAT TROPONIN, ED  I-STAT CG4 LACTIC ACID, ED  I-STAT CG4 LACTIC ACID, ED  I-STAT CG4 LACTIC ACID, ED    EKG  EKG Interpretation  Date/Time:  Wednesday August 07 2017 18:29:35 EST Ventricular Rate:  68 PR Interval:    QRS Duration: 91 QT Interval:  444 QTC Calculation: 473 R Axis:   82 Text Interpretation:  Sinus rhythm Consider left atrial enlargement Borderline right axis deviation RSR' in V1 or V2, probably normal variant No significant change since last tracing Confirmed by Gareth Morgan [phone]) on 08/07/2017 7:28:18 PM Also confirmed by Gareth Morgan [phone]), editor Laurena Spies [phone])  on 08/08/2017 8:52:45 AM       Radiology Dg Chest 2 View  Result Date: 08/07/2017 CLINICAL DATA:  Weakness, nausea, vomiting and diarrhea x1 week. EXAM: CHEST  2 VIEW COMPARISON:  None. FINDINGS: The heart size and mediastinal contours are within normal limits. Minimal aortic atherosclerosis without aneurysm. Both lungs are clear. Osteoarthritis with undersurface spurring is noted about the [place] joints right greater than left with slightly high riding humeral heads bilaterally that may reflect chronic rotator cuff tears. IMPRESSION: No active cardiopulmonary  disease. Electronically Signed   By: Ashley Royalty M.D.   On: 08/07/2017 17:52   US Renal  Result Date: 08/07/2017 CLINICAL DATA:  Acute renal injury.  Elevated renal function tests. EXAM: RENAL / URINARY TRACT ULTRASOUND COMPLETE COMPARISON:  08/07/2017 CT. FINDINGS: Right Kidney: Length: 9.5 cm. Echogenicity within normal limits. No mass or hydronephrosis visualized. Left Kidney: Length: 10.8 cm. Echogenicity within normal limits. No mass or hydronephrosis visualized. Bladder: Decompressed by Foley catheter. IMPRESSION: 1. Maintained cortical-medullary distinction within both kidneys without obstructive uropathy. 2. Foley decompression of the urinary bladder. Electronically Signed   By: Ashley Royalty M.D.   On: 08/07/2017 23:34   Ct Renal Stone Study  Result Date: 08/07/2017 CLINICAL DATA:  Five check, vomiting and diarrhea for 1 week. EXAM: CT ABDOMEN AND PELVIS WITHOUT CONTRAST TECHNIQUE: Multidetector CT imaging of the abdomen and pelvis was performed following the standard protocol without IV contrast. COMPARISON:  08/07/2013 FINDINGS: Lower chest: The lung bases are clear of acute process. No pleural effusion  or pulmonary lesions. The heart is normal in size. No pericardial effusion. The distal esophagus and aorta are unremarkable. Hepatobiliary: No focal hepatic lesions or intrahepatic biliary dilatation. The gallbladder is normal. No common bile duct dilatation. Pancreas: No mass, inflammation or ductal dilatation. Spleen: Normal size.  No focal lesions. Adrenals/Urinary Tract: The adrenal glands and kidneys are unremarkable. No renal, ureteral or bladder calculi or mass. Stomach/Bowel: The stomach, duodenum, small bowel and colon are unremarkable. No acute inflammatory changes, mass lesions or obstructive findings. The terminal ileum is normal. The appendix is normal. Moderate descending and sigmoid colon diverticulosis but no findings for acute diverticulitis. Vascular/Lymphatic: Moderate to  advanced atherosclerotic calcifications involving the aorta and iliac arteries. No mesenteric or retroperitoneal mass or adenopathy. Reproductive: The prostate gland is surgically absent. Other: No pelvic mass or adenopathy. No free pelvic fluid collections. No inguinal mass or adenopathy. No abdominal wall hernia or subcutaneous lesions. Musculoskeletal: No acute bony findings. There are a few small stable sclerotic pelvic lesions. Prior RF lumbar surgery changes noted with laminectomies and fixation heart wire. The the L3 pedicle screws are loose. IMPRESSION: 1. No acute abdominal/pelvic findings, mass lesions or adenopathy. 2. No findings for inflammatory bowel process or obstruction. 3. Advanced atherosclerotic calcifications involving the aorta and iliac arteries. 4. Status post prostatectomy. There are few small scattered sclerotic bone lesions which are stable. 5. Loose pedicle screws are noted at L3. Electronically Signed   By: Marijo Sanes M.D.   On: 08/07/2017 20:41    Procedures .Critical Care Performed by: Gareth Morgan, MD Authorized by: Gareth Morgan, MD   Comments:     CRITICAL CARE: dehydration, hypotension Performed by: Tennis Must   Total critical care time: 30 minutes  Critical care time was exclusive of separately billable procedures and treating other patients.  Critical care was necessary to treat or prevent imminent or life-threatening deterioration.  Critical care was time spent personally by me on the following activities: development of treatment plan with patient and/or surrogate as well as nursing, discussions with consultants, evaluation of patient's response to treatment, examination of patient, obtaining history from patient or surrogate, ordering and performing treatments and interventions, ordering and review of laboratory studies, ordering and review of radiographic studies, pulse oximetry and re-evaluation of patient's condition.    (including  critical care time)  Medications Ordered in ED Medications  0.9 %  sodium chloride infusion ( Intravenous Rate/Dose Change 08/08/17 1135)  HYDROcodone-acetaminophen (NORCO/VICODIN) 5-325 MG per tablet 1 tablet (not administered)  ALPRAZolam (XANAX) tablet 0.5 mg (not administered)  aspirin EC tablet 81 mg (81 mg Oral Given 08/08/17 1012)  levothyroxine (SYNTHROID, LEVOTHROID) tablet 100 mcg (100 mcg Oral Given 08/08/17 0747)  pravastatin (PRAVACHOL) tablet 80 mg (not administered)  heparin injection 5,000 Units (5,000 Units Subcutaneous Given 08/08/17 1421)  ondansetron (ZOFRAN) tablet 4 mg (not administered)    Or  ondansetron (ZOFRAN) injection 4 mg (not administered)  feeding supplement (ENSURE ENLIVE) (ENSURE ENLIVE) liquid 237 mL (237 mLs Oral Given 08/08/17 1014)  gabapentin (NEURONTIN) capsule 100 mg (not administered)  esomeprazole (NEXIUM) capsule 80 mg (80 mg Oral Given 08/08/17 1259)  sodium chloride 0.9 % bolus 1,000 mL (0 mLs Intravenous Stopped 08/07/17 1934)    And  sodium chloride 0.9 % bolus 1,000 mL (0 mLs Intravenous Stopped 08/07/17 1935)    And  sodium chloride 0.9 % bolus 250 mL (0 mLs Intravenous Stopped 08/07/17 1828)  sodium chloride 0.9 % bolus 1,000 mL (0 mLs Intravenous Stopped  08/08/17 0159)  lidocaine (XYLOCAINE) 2 % jelly 1 application (1 application Urethral Given 08/07/17 2139)  potassium chloride SA (K-DUR,KLOR-CON) CR tablet 40 mEq (40 mEq Oral Given 08/08/17 0022)  dextrose 50 % solution (50 mLs  Given 08/08/17 0019)  dextrose 50 % solution (25 mLs  Given 08/08/17 2458)     Initial Impression / Assessment and Plan / ED Course  I have reviewed the triage vital signs and the nursing notes.  Pertinent labs & imaging results that were available during my care of the patient were reviewed by me and considered in my medical decision making (see chart for details).     82yo male with hx of DM, htn, hlpd, prostate cancer, presents with concern for  generalized weakness in setting of mild chronic diarrhea and emesis with severe nausea, vomiting and diarrhea on Monday.  Blood pressures with EMS initially 68 systolic, improved with IV fluids.  Suspect hypotension most likely secondary to severe hypovolemia in setting of recent diarrhea/vomiting and patient taking antihypertensive.  Doubt sepsis given no sign of bacterial infection, afebrile.   Labs significant for severe acute kidney injury with Cr of 7.  No known hx of kidney disease per patient and daughter. No indication for emergent dialysis.  CT shows no sign of obstruction.  Suspect AKI secondary to hypovolemia and hypotension.  Given 30cc/kg normal saline and additional 1L NS.  MAPs WNL, blood pressures 90 systolic however stable.  Discussed with Nephrology, Dr. Jimmy Footman. Placed Foley catheter. Admitted for further care.    Final Clinical Impressions(s) / ED Diagnoses   Final diagnoses:  AKI (acute kidney injury) ([place])  Hypovolemia  Other specified hypotension    ED Discharge Orders    None       Gareth Morgan, MD 08/08/17 1422

## 2017-08-07 NOTE — ED Triage Notes (Signed)
Per EMS, patient comes from home. C/o weakness and n/v/d x 1 week. No n/v/d/ in last 2 days. Slight cough. No urination in 24 hours. On arrival BP was 68/42, 1500cc fluid given, BP 83/54. 12L EKG unremarkable. EMS unable to do orthostatics d/t weakness. Pt hx diabetes, sugar 236. Oral intake has been at baseline. A&O x4. Daughter is on way who knows about medical hx.

## 2017-08-07 NOTE — ED Notes (Signed)
Bed: LT90 Expected date:  Expected time:  Means of arrival:  Comments: EMS 37 M hypotension, weakness

## 2017-08-07 NOTE — H&P (Addendum)
TRH H&P    Patient Demographics:    Roger Hanson, is a 82 y.o. male  MRN: [id number]  DOB - [birth year]  Admit Date - 08/07/2017  Referring MD/NP/PA: Dr. Billy Fischer  Outpatient Primary MD for the patient is Derinda Late, MD  Patient coming from: Home Chief Complaint  Patient presents with  . Hypotension      HPI:    Roger Hanson  is a 82 y.o. male,.. With history of prostate cancer, status post prostatectomy, hypothyroidism, hypertension, diabetes mellitus, hyperlipidemia was brought to the hospital for poor p.o. intake.  Patient is unable to provide any history due to memory issues.  As per daughter patient started having diarrhea and vomiting 3 days ago, he had almost 10 episodes of both vomiting and diarrhea.  There was no black or bloody stools.  Symptoms completely resolved today. But patient had very poor p.o. intake and became very weak at home, daughter got concerned so she brought him to the hospital.  In the ED, lab work revealed acute kidney injury BUN 94, creatinine 7.20. Potassium 3.2 Nephrology was consulted, and patient started on IV fluids as AK I seems to be due to regional cause due to poor p.o. intake with nausea vomiting and diarrhea. Patient received 3 L of normal saline in the ED  He denies chest pain Complains of reduced urine output over the past 3 days Denies dysuria Denies shortness of breath    Review of systems:      All other systems reviewed and are negative.   With Past History of the following :    Past Medical History:  Diagnosis Date  . Anxiety   . DDD (degenerative disc disease), lumbosacral   . Diabetes mellitus without complication   . Diverticulosis   . GERD (gastroesophageal reflux disease)   . Hearing loss   . Hypercholesteremia   . Hypertension   . Hypothyroidism   . Insomnia   . Lumbar radiculopathy   . Prostate cancer   . Psoriasis   .  PUD (peptic ulcer disease)   . Pulmonary nodules   . Restless legs syndrome (RLS)   . Vitamin D deficiency disease          Social History:      Social History   Tobacco Use  . Smoking status: Former Smoker  Substance Use Topics  . Alcohol use: No       Family History :   Patient's grandmother was diagnosed with breast cancer   Home Medications:   Prior to Admission medications   Medication Sig Start Date End Date Taking? Authorizing Provider  ALPRAZolam Duanne Moron) 0.5 MG tablet Take 0.5 mg by mouth at bedtime as needed for anxiety.   Yes [provider]  aspirin 81 MG tablet Take 81 mg by mouth daily.   Yes [provider]  celecoxib (CELEBREX) 200 MG capsule Take 200 mg by mouth daily. 06/27/17  Yes [provider]  cholecalciferol (VITAMIN D) 1000 UNITS tablet Take 2,000 Units by mouth daily.   Yes [provider]  ESOMEPRAZOLE MAGNESIUM PO Take 22.3 mg by mouth every morning.    Yes [provider]  gabapentin (NEURONTIN) 300 MG capsule Take 300 mg by mouth at bedtime.   Yes [provider]  glimepiride (AMARYL) 4 MG tablet Take 4 mg by mouth 2 (two) times daily.   Yes [provider]  hydrochlorothiazide (HYDRODIURIL) 25 MG tablet Take 12.5 mg by mouth daily. 07/25/17  Yes [provider]  Levothyroxine Sodium 100 MCG CAPS Take 100 mcg by mouth daily before breakfast.    Yes [provider]  losartan (COZAAR) 100 MG tablet Take 100 mg by mouth daily.   Yes [provider]  ondansetron (ZOFRAN) 4 MG tablet Take 4 mg by mouth 2 (two) times daily as needed for nausea or vomiting.   Yes [provider]  pravastatin (PRAVACHOL) 80 MG tablet Take 80 mg by mouth every evening.    Yes [provider]  TRADJENTA 5 MG TABS tablet Take 5 mg by mouth every morning. 07/24/17  Yes [provider]  ciprofloxacin (CIPRO) 500 MG tablet Take 1 tablet (500 mg total) by mouth 2  (two) times daily. Patient not taking: Reported on 08/07/2017 08/09/13   Roger Ford, DO  glyBURIDE (DIABETA) 5 MG tablet Take 5 mg by mouth 2 (two) times daily with a meal.    [provider]  HYDROcodone-acetaminophen (NORCO/VICODIN) 5-325 MG per tablet Take 1 tablet by mouth every 6 (six) hours as needed for moderate pain.    [provider]  metroNIDAZOLE (FLAGYL) 500 MG tablet Take 1 tablet (500 mg total) by mouth 3 (three) times daily. Patient not taking: Reported on 08/07/2017 08/09/13   Roger Ford, DO     Allergies:     Allergies  Allergen Reactions  . Glipizide Other (See Comments)    Erythema multiforme  . Lovastatin     Erythema multiforme  . Omeprazole Nausea And Vomiting  . Protonix [Pantoprazole Sodium] Nausea Only     Physical Exam:   Vitals  Blood pressure (!) 113/56, pulse 71, temperature 98 F (36.7 C), temperature source Rectal, resp. rate 18, height 5\' 8"  (1.727 m), weight 74.8 kg (165 lb), SpO2 97 %.  1.  General: Patient appears in no acute distress  2. Psychiatric:  Intact judgement and  insight, awake alert, oriented x 3.  3. Neurologic: No focal neurological deficits, all cranial nerves intact.Strength 5/5 all 4 extremities, sensation intact all 4 extremities, plantars down going.  4. Eyes :  anicteric sclerae, moist conjunctivae with no lid lag. PERRLA.  5. ENMT:  Oropharynx clear with moist mucous membranes and good dentition  6. Neck:  supple, no cervical lymphadenopathy appriciated, No thyromegaly  7. Respiratory : Normal respiratory effort, good air movement bilaterally,clear to  auscultation bilaterally  8. Cardiovascular : RRR, no gallops, rubs or murmurs, no leg edema  9. Gastrointestinal:  Positive bowel sounds, abdomen soft, non-tender to palpation,no hepatosplenomegaly, no rigidity or guarding       10. Skin:  No cyanosis, normal texture and turgor, no rash, lesions or  ulcers  11.Musculoskeletal:  Good muscle tone,  joints appear normal , no effusions,  normal range of motion    Data Review:    CBC Recent Labs  Lab 08/07/17 1830 08/07/17 1850  WBC 16.2*  --   HGB 13.0 13.3  HCT 38.1* 39.0  PLT 162  --   MCV 90.3  --   MCH 30.8  --   MCHC  34.1  --   RDW 13.3  --   LYMPHSABS 2.0  --   MONOABS 1.3*  --   EOSABS 0.0  --   BASOSABS 0.0  --    ------------------------------------------------------------------------------------------------------------------  Chemistries  Recent Labs  Lab 08/07/17 1830 08/07/17 1850  NA 132* 133*  K 3.2* 3.2*  CL 84* 86*  CO2 33*  --   GLUCOSE 144* 140*  BUN 98* 94*  CREATININE 7.35* 7.20*  CALCIUM 7.4*  --   AST 16  --   ALT 14*  --   ALKPHOS 91  --   BILITOT 2.0*  --    ------------------------------------------------------------------------------------------------------------------  ------------------------------------------------------------------------------------------------------------------ GFR: Estimated Creatinine Clearance: 7.9 mL/min (A) (by C-G formula based on SCr of 7.2 mg/dL (H)). Liver Function Tests: Recent Labs  Lab 08/07/17 1830  AST 16  ALT 14*  ALKPHOS 91  BILITOT 2.0*  PROT 6.2*  ALBUMIN 3.5    --------------------------------------------------------------------------------------------------------------- Urine analysis:    Component Value Date/Time   COLORURINE AMBER (A) 08/07/2017 1830   APPEARANCEUR HAZY (A) 08/07/2017 1830   LABSPEC 1.016 08/07/2017 1830   PHURINE 5.0 08/07/2017 1830   GLUCOSEU 50 (A) 08/07/2017 1830   HGBUR SMALL (A) 08/07/2017 1830   BILIRUBINUR NEGATIVE 08/07/2017 1830   KETONESUR NEGATIVE 08/07/2017 1830   PROTEINUR NEGATIVE 08/07/2017 1830   UROBILINOGEN 0.2 08/07/2013 1305   NITRITE NEGATIVE 08/07/2017 1830   LEUKOCYTESUR NEGATIVE 08/07/2017 1830      Imaging Results:    Dg Chest 2 View  Result Date: 08/07/2017 CLINICAL  DATA:  Weakness, nausea, vomiting and diarrhea x1 week. EXAM: CHEST  2 VIEW COMPARISON:  None. FINDINGS: The heart size and mediastinal contours are within normal limits. Minimal aortic atherosclerosis without aneurysm. Both lungs are clear. Osteoarthritis with undersurface spurring is noted about the [facility] joints right greater than left with slightly high riding humeral heads bilaterally that may reflect chronic rotator cuff tears. IMPRESSION: No active cardiopulmonary disease. Electronically Signed   By: Ashley Royalty M.D.   On: 08/07/2017 17:52   Ct Renal Stone Study  Result Date: 08/07/2017 CLINICAL DATA:  Five check, vomiting and diarrhea for 1 week. EXAM: CT ABDOMEN AND PELVIS WITHOUT CONTRAST TECHNIQUE: Multidetector CT imaging of the abdomen and pelvis was performed following the standard protocol without IV contrast. COMPARISON:  08/07/2013 FINDINGS: Lower chest: The lung bases are clear of acute process. No pleural effusion or pulmonary lesions. The heart is normal in size. No pericardial effusion. The distal esophagus and aorta are unremarkable. Hepatobiliary: No focal hepatic lesions or intrahepatic biliary dilatation. The gallbladder is normal. No common bile duct dilatation. Pancreas: No mass, inflammation or ductal dilatation. Spleen: Normal size.  No focal lesions. Adrenals/Urinary Tract: The adrenal glands and kidneys are unremarkable. No renal, ureteral or bladder calculi or mass. Stomach/Bowel: The stomach, duodenum, small bowel and colon are unremarkable. No acute inflammatory changes, mass lesions or obstructive findings. The terminal ileum is normal. The appendix is normal. Moderate descending and sigmoid colon diverticulosis but no findings for acute diverticulitis. Vascular/Lymphatic: Moderate to advanced atherosclerotic calcifications involving the aorta and iliac arteries. No mesenteric or retroperitoneal mass or adenopathy. Reproductive: The prostate gland is surgically absent. Other: No  pelvic mass or adenopathy. No free pelvic fluid collections. No inguinal mass or adenopathy. No abdominal wall hernia or subcutaneous lesions. Musculoskeletal: No acute bony findings. There are a few small stable sclerotic pelvic lesions. Prior RF lumbar surgery changes noted with laminectomies and fixation heart wire. The the L3 pedicle  screws are loose. IMPRESSION: 1. No acute abdominal/pelvic findings, mass lesions or adenopathy. 2. No findings for inflammatory bowel process or obstruction. 3. Advanced atherosclerotic calcifications involving the aorta and iliac arteries. 4. Status post prostatectomy. There are few small scattered sclerotic bone lesions which are stable. 5. Loose pedicle screws are noted at L3. Electronically Signed   By: Marijo Sanes M.D.   On: 08/07/2017 20:41    My personal review of EKG: Rhythm NSR   Assessment & Plan:    Active Problems:   AKI (acute kidney injury) ([employer])   1. Acute kidney injury-nonoliguric ,likely prerenal from vomiting and poor p.o. intake.  Patient is also on HCTZ and Cozaar at home.  Patient started on aggressive IV hydration.  He received 3 L of normal saline in the ED.  Good urine output of 600 cc in the ED. continue normal saline at 75 mL/h.  Avoid NSAIDs.  Renal ultrasound, urine sodium, urine creatinine has been ordered by nephrology.  Follow the results. 2. Hypokalemia-potassium is 3.2, will replace potassium and check BMP in a.m. 3. Diabetes mellitus-hold oral glycemic agents, will start sliding scale insulin with NovoLog. 4. Hypothyroidism-continue Synthroid 5. Hypertension - blood pressure stable, hold Cozaar, HCTZ due to above. 6. Hyperlipidemia-continue pravastatin    DVT Prophylaxis-   heparin  AM Labs Ordered, also please review Full Orders  Family Communication: Admission, patients condition and plan of care including tests being ordered have been discussed with the patient and his daughter at bedside* who indicate understanding and  agree with the plan and Code Status.  Code Status: Full code  Admission status: Inpatient  Time spent in minutes : 60 minutes   Oswald Hillock M.D on 08/07/2017 at 10:52 PM  Between 7am to 7pm - Pager - [phone]. After 7pm go to [email or web address] - password [facility]  [facility] - Office  [phone]

## 2017-08-08 ENCOUNTER — Encounter (HOSPITAL_COMMUNITY): Payer: Self-pay

## 2017-08-08 ENCOUNTER — Other Ambulatory Visit: Payer: Self-pay

## 2017-08-08 DIAGNOSIS — E11649 Type 2 diabetes mellitus with hypoglycemia without coma: Secondary | ICD-10-CM

## 2017-08-08 DIAGNOSIS — K219 Gastro-esophageal reflux disease without esophagitis: Secondary | ICD-10-CM

## 2017-08-08 DIAGNOSIS — I10 Essential (primary) hypertension: Secondary | ICD-10-CM

## 2017-08-08 DIAGNOSIS — E039 Hypothyroidism, unspecified: Secondary | ICD-10-CM

## 2017-08-08 LAB — GLUCOSE, CAPILLARY
GLUCOSE-CAPILLARY: 93 mg/dL (ref 65–99)
GLUCOSE-CAPILLARY: 120 mg/dL — AB (ref 65–99)
Glucose-Capillary: 105 mg/dL — ABNORMAL HIGH (ref 65–99)
Glucose-Capillary: 35 mg/dL — CL (ref 65–99)
Glucose-Capillary: 80 mg/dL (ref 65–99)
Glucose-Capillary: 289 mg/dL — ABNORMAL HIGH (ref 65–99)
Glucose-Capillary: 156 mg/dL — ABNORMAL HIGH (ref 65–99)

## 2017-08-08 LAB — SODIUM, URINE, RANDOM: SODIUM UR: 71 mmol/L

## 2017-08-08 LAB — COMPREHENSIVE METABOLIC PANEL
ALK PHOS: 81 U/L (ref 38–126)
ALT: 13 U/L — AB (ref 17–63)
AST: 23 U/L (ref 15–41)
Albumin: 3.2 g/dL — ABNORMAL LOW (ref 3.5–5.0)
Anion gap: 13 (ref 5–15)
BUN: 84 mg/dL — AB (ref 6–20)
CO2: 27 mmol/L (ref 22–32)
Calcium: 7.6 mg/dL — ABNORMAL LOW (ref 8.9–10.3)
Chloride: 96 mmol/L — ABNORMAL LOW (ref 101–111)
Creatinine, Ser: 5.66 mg/dL — ABNORMAL HIGH (ref 0.61–1.24)
GFR calc non Af Amer: 8 mL/min — ABNORMAL LOW (ref >60)
GFR, EST AFRICAN AMERICAN: 10 mL/min — AB (ref >60)
GLUCOSE: 192 mg/dL — AB (ref 65–99)
Potassium: 3.4 mmol/L — ABNORMAL LOW (ref 3.5–5.1)
SODIUM: 136 mmol/L (ref 135–145)
TOTAL PROTEIN: 5.7 g/dL — AB (ref 6.5–8.1)
Total Bilirubin: 1.6 mg/dL — ABNORMAL HIGH (ref 0.3–1.2)

## 2017-08-08 LAB — CBC
HCT: 38.3 % — ABNORMAL LOW (ref 39.0–52.0)
HEMOGLOBIN: 12.8 g/dL — AB (ref 13.0–17.0)
MCH: 30.6 pg (ref 26.0–34.0)
MCHC: 33.4 g/dL (ref 30.0–36.0)
MCV: 91.6 fL (ref 78.0–100.0)
Platelets: 136 10*3/uL — ABNORMAL LOW (ref 150–400)
RBC: 4.18 MIL/uL — AB (ref 4.22–5.81)
RDW: 13.4 % (ref 11.5–15.5)
WBC: 11.8 10*3/uL — AB (ref 4.0–10.5)

## 2017-08-08 LAB — CREATININE, SERUM
Creatinine, Ser: 5.75 mg/dL — ABNORMAL HIGH (ref 0.61–1.24)
GFR calc Af Amer: 10 mL/min — ABNORMAL LOW (ref >60)
GFR, EST NON AFRICAN AMERICAN: 8 mL/min — AB (ref >60)

## 2017-08-08 LAB — CBG MONITORING, ED
GLUCOSE-CAPILLARY: 294 mg/dL — AB (ref 65–99)
Glucose-Capillary: 170 mg/dL — ABNORMAL HIGH (ref 65–99)
Glucose-Capillary: 263 mg/dL — ABNORMAL HIGH (ref 65–99)

## 2017-08-08 LAB — HEMOGLOBIN A1C
Hgb A1c MFr Bld: 7.2 % — ABNORMAL HIGH (ref 4.8–5.6)
Mean Plasma Glucose: 159.94 mg/dL

## 2017-08-08 LAB — CREATININE, URINE, RANDOM: Creatinine, Urine: 56.54 mg/dL

## 2017-08-08 MED ORDER — GABAPENTIN 300 MG PO CAPS: 300.0000 mg | ORAL_CAPSULE | Freq: Every day | ORAL | Status: DC

## 2017-08-08 MED ORDER — HEPARIN SODIUM (PORCINE) 5000 UNIT/ML IJ SOLN
5000.0000 [IU] | Freq: Three times a day (TID) | INTRAMUSCULAR | Status: DC
  Administered 2017-08-08 – 2017-08-12 (×13): 5000 [IU] via SUBCUTANEOUS
  Filled 2017-08-08 (×5): qty 1

## 2017-08-08 MED ORDER — ESOMEPRAZOLE MAGNESIUM 40 MG PO CPDR
80.0000 mg | DELAYED_RELEASE_CAPSULE | Freq: Every day | ORAL | Status: DC
  Administered 2017-08-08 – 2017-08-12 (×5): 80 mg via ORAL
  Filled 2017-08-08 (×5): qty 2

## 2017-08-08 MED ORDER — LEVOTHYROXINE SODIUM 100 MCG PO TABS
100.0000 ug | ORAL_TABLET | Freq: Every day | ORAL | Status: DC
  Administered 2017-08-08 – 2017-08-12 (×5): 100 ug via ORAL
  Filled 2017-08-08 (×5): qty 1

## 2017-08-08 MED ORDER — ALPRAZOLAM 0.5 MG PO TABS
0.5000 mg | ORAL_TABLET | Freq: Every evening | ORAL | Status: DC | PRN
  Administered 2017-08-08 – 2017-08-11 (×3): 0.5 mg via ORAL
  Filled 2017-08-08 (×4): qty 1

## 2017-08-08 MED ORDER — ONDANSETRON HCL 4 MG PO TABS: 4.0000 mg | ORAL_TABLET | Freq: Four times a day (QID) | ORAL | Status: DC | PRN

## 2017-08-08 MED ORDER — PRAVASTATIN SODIUM 40 MG PO TABS
80.0000 mg | ORAL_TABLET | Freq: Every evening | ORAL | Status: DC
  Administered 2017-08-08 – 2017-08-11 (×4): 80 mg via ORAL
  Filled 2017-08-08 (×4): qty 2

## 2017-08-08 MED ORDER — DEXTROSE 50 % IV SOLN
INTRAVENOUS | Status: AC
  Administered 2017-08-08: 25 mL
  Filled 2017-08-08: qty 50

## 2017-08-08 MED ORDER — ASPIRIN EC 81 MG PO TBEC
81.0000 mg | DELAYED_RELEASE_TABLET | Freq: Every day | ORAL | Status: DC
  Administered 2017-08-08 – 2017-08-12 (×5): 81 mg via ORAL
  Filled 2017-08-08 (×5): qty 1

## 2017-08-08 MED ORDER — HYDROCODONE-ACETAMINOPHEN 5-325 MG PO TABS
1.0000 | ORAL_TABLET | Freq: Four times a day (QID) | ORAL | Status: DC | PRN
  Administered 2017-08-08 – 2017-08-09 (×2): 1 via ORAL
  Filled 2017-08-08 (×2): qty 1

## 2017-08-08 MED ORDER — ENSURE ENLIVE PO LIQD
237.0000 mL | Freq: Two times a day (BID) | ORAL | Status: DC
  Administered 2017-08-08: 237 mL via ORAL

## 2017-08-08 MED ORDER — DEXTROSE 50 % IV SOLN
INTRAVENOUS | Status: AC
  Administered 2017-08-08: 50 mL
  Filled 2017-08-08: qty 50

## 2017-08-08 MED ORDER — GABAPENTIN 100 MG PO CAPS
100.0000 mg | ORAL_CAPSULE | Freq: Every day | ORAL | Status: DC
  Administered 2017-08-08 – 2017-08-11 (×4): 100 mg via ORAL
  Filled 2017-08-08 (×4): qty 1

## 2017-08-08 MED ORDER — ONDANSETRON HCL 4 MG/2ML IJ SOLN: 4.0000 mg | Freq: Four times a day (QID) | INTRAMUSCULAR | Status: DC | PRN

## 2017-08-08 MED ORDER — ESOMEPRAZOLE MAGNESIUM 40 MG PO CPDR
40.0000 mg | DELAYED_RELEASE_CAPSULE | Freq: Every day | ORAL | Status: DC
  Filled 2017-08-08: qty 1

## 2017-08-08 NOTE — Progress Notes (Signed)
CRITICAL VALUE ALERT  Critical Value:  Glucose blood sugar CBG  Date & Time Notied:  0600 08/08/17   Provider Notified: Schorr  Orders Received/Actions taken:  OJ 4oz Hypoglycemic protocol 34ml Dextrose  CBG now 105

## 2017-08-08 NOTE — ED Notes (Signed)
Please call report to RN Lenna Sciara [phone] at [place].

## 2017-08-08 NOTE — Progress Notes (Addendum)
PROGRESS NOTE  Roger Hanson  PJK:[id number] DOB: [birth year] DOA: 08/07/2017 PCP: Derinda Late, MD  Brief Narrative:   The patient is an 82 year old male with history of prostate cancer status post prostatectomy, hypothyroidism, hypertension, diabetes mellitus, hyperlipidemia who was brought to the hospital after he had problems with nausea, vomiting, and diarrhea.  He had very little oral intake and became very weak.  When they brought him to the emergency department he had acute kidney injury.  His creatinine was 7.20 with a BUN of 94.  He did not have a notable metabolic acidosis nor was he hyperkalemic.  Nephrology was consulted and he has been started on IV fluids.  He has had brisk urine output over the last 24 hours and his creatinine has started to trend down.  Assessment & Plan:   Active Problems:   AKI (acute kidney injury) ([place])  Probable acute kidney injury secondary to dehydration from his Foley or diarrhea.  Patient has an unknown baseline creatinine but apparently had blood work done by his primary care doctor about a month ago.  Due to the holiday and I am unable to get records, but I will try tomorrow. -Increase IV fluids secondary to hypertension and high urine output -Continue to hold HCTZ and Cozaar -Avoid nephrotoxins and renally dose medications -Appreciate nephrology assistance  Hypokalemia, will give oral potassium supplementation  Diabetes mellitus, hypoglycemic overnight because he was administered insulin at 2 AM -DC insulin -Continue to check CBGs for the next 24 hours before meals at bedtime and at 3 AM to monitor for hypoglycemia -Continue to hold oral agents  Hypothyroidism, stable, continue Synthroid  Hypertension, blood pressures have been low, continue to hold Cozaar and HCTZ  Hyperlipidemia, stable, continue pravastatin  Telemetry:  NSR.  Potassium near normal.  Okay to d/c telemetry  DVT prophylaxis: Heparin Code Status: Full code Family  Communication: Patient and his son Disposition Plan: Likely home in a few days once his creatinine is closer to his baseline.  Need to establish his baseline.   Consultants:   Nephrology  Procedures:  None  Antimicrobials:  Anti-infectives (From admission, onward)   None       Subjective: Patient states that he is feeling pretty well.  He was able to eat some of his breakfast.  His son is concerned because he normally takes Nexium 80 mg once daily prior to breakfast and if he does not he has problems with nausea and vomiting throughout the day.  According to the son, he has problems with nausea if he takes any other PPI.  Patient currently denies any nausea or upset stomach, abdominal pains.  He has not had any recent diarrhea.  He denies shortness of breath  Objective: Vitals:   08/08/17 0159 08/08/17 0253 08/08/17 0638 08/08/17 1249  BP: (!) 93/48 119/76 (!) 109/52 119/62  Pulse: 78 71 66 72  Resp: 20 19 16 17   Temp:  97.9 F (36.6 C) 98 F (36.7 C) 98.2 F (36.8 C)  TempSrc:  Oral Oral Oral  SpO2: 94% 99% 96% 97%  Weight:  74.8 kg (164 lb 14.5 oz)    Height:  5\' 8"  (1.727 m)      Intake/Output Summary (Last 24 hours) at 08/08/2017 1400 Last data filed at 08/08/2017 1034 Gross per 24 hour  Intake 2815 ml  Output 3000 ml  Net -185 ml   Filed Weights   08/07/17 1713 08/08/17 0253  Weight: 74.8 kg (165 lb) 74.8 kg (164 lb  14.5 oz)    Examination:  General exam:  Adult male.  No acute distress.  HEENT:  NCAT, MMM Respiratory system: Clear to auscultation bilaterally Cardiovascular system: Regular rate and rhythm, normal S1/S2. No murmurs, rubs, gallops or clicks.  Warm extremities Gastrointestinal system: Normal active bowel sounds, soft, nondistended, nontender. MSK:  Normal tone and bulk, no lower extremity edema Neuro:  Grossly intact    Data Reviewed: I have personally reviewed following labs and imaging studies  CBC: Recent Labs  Lab 08/07/17 1830  08/07/17 1850 08/08/17 0313  WBC 16.2*  --  11.8*  NEUTROABS 12.8*  --   --   HGB 13.0 13.3 12.8*  HCT 38.1* 39.0 38.3*  MCV 90.3  --  91.6  PLT 162  --  976*   Basic Metabolic Panel: Recent Labs  Lab 08/07/17 1830 08/07/17 1850 08/07/17 2214 08/08/17 0313  NA 132* 133* 138 136  K 3.2* 3.2* 3.1* 3.4*  CL 84* 86* 94* 96*  CO2 33*  --  29 27  GLUCOSE 144* 140* 62* 192*  BUN 98* 94* 88* 84*  CREATININE 7.35* 7.20* 6.33* 5.66*  5.75*  CALCIUM 7.4*  --  7.5* 7.6*  PHOS  --   --  4.6  --    GFR: Estimated Creatinine Clearance: 9.9 mL/min (A) (by C-G formula based on SCr of 5.75 mg/dL (H)). Liver Function Tests: Recent Labs  Lab 08/07/17 1830 08/07/17 2214 08/08/17 0313  AST 16  --  23  ALT 14*  --  13*  ALKPHOS 91  --  81  BILITOT 2.0*  --  1.6*  PROT 6.2*  --  5.7*  ALBUMIN 3.5 3.4* 3.2*   No results for input(s): LIPASE, AMYLASE in the last 168 hours. No results for input(s): AMMONIA in the last 168 hours. Coagulation Profile: No results for input(s): INR, PROTIME in the last 168 hours. Cardiac Enzymes: Recent Labs  Lab 08/07/17 2214  CKTOTAL 281   BNP (last 3 results) No results for input(s): PROBNP in the last 8760 hours. HbA1C: Recent Labs    08/07/17 2338  HGBA1C 7.2*   CBG: Recent Labs  Lab 08/08/17 0605 08/08/17 0622 08/08/17 0702 08/08/17 0852 08/08/17 1238  GLUCAP 35* 105* 93 120* 80   Lipid Profile: No results for input(s): CHOL, HDL, LDLCALC, TRIG, CHOLHDL, LDLDIRECT in the last 72 hours. Thyroid Function Tests: No results for input(s): TSH, T4TOTAL, FREET4, T3FREE, THYROIDAB in the last 72 hours. Anemia Panel: No results for input(s): VITAMINB12, FOLATE, FERRITIN, TIBC, IRON, RETICCTPCT in the last 72 hours. Urine analysis:    Component Value Date/Time   COLORURINE AMBER (A) 08/07/2017 1830   APPEARANCEUR HAZY (A) 08/07/2017 1830   LABSPEC 1.016 08/07/2017 1830   PHURINE 5.0 08/07/2017 1830   GLUCOSEU 50 (A) 08/07/2017 1830    HGBUR SMALL (A) 08/07/2017 1830   BILIRUBINUR NEGATIVE 08/07/2017 1830   KETONESUR NEGATIVE 08/07/2017 1830   PROTEINUR NEGATIVE 08/07/2017 1830   UROBILINOGEN 0.2 08/07/2013 1305   NITRITE NEGATIVE 08/07/2017 1830   LEUKOCYTESUR NEGATIVE 08/07/2017 1830   Sepsis Labs: @LABRCNTIP (procalcitonin:4,lacticidven:4)  )No results found for this or any previous visit (from the past 240 hour(s)).    Radiology Studies: Dg Chest 2 View  Result Date: 08/07/2017 CLINICAL DATA:  Weakness, nausea, vomiting and diarrhea x1 week. EXAM: CHEST  2 VIEW COMPARISON:  None. FINDINGS: The heart size and mediastinal contours are within normal limits. Minimal aortic atherosclerosis without aneurysm. Both lungs are clear. Osteoarthritis with undersurface spurring  is noted about the [facility] joints right greater than left with slightly high riding humeral heads bilaterally that may reflect chronic rotator cuff tears. IMPRESSION: No active cardiopulmonary disease. Electronically Signed   By: Ashley Royalty M.D.   On: 08/07/2017 17:52   US Renal  Result Date: 08/07/2017 CLINICAL DATA:  Acute renal injury.  Elevated renal function tests. EXAM: RENAL / URINARY TRACT ULTRASOUND COMPLETE COMPARISON:  08/07/2017 CT. FINDINGS: Right Kidney: Length: 9.5 cm. Echogenicity within normal limits. No mass or hydronephrosis visualized. Left Kidney: Length: 10.8 cm. Echogenicity within normal limits. No mass or hydronephrosis visualized. Bladder: Decompressed by Foley catheter. IMPRESSION: 1. Maintained cortical-medullary distinction within both kidneys without obstructive uropathy. 2. Foley decompression of the urinary bladder. Electronically Signed   By: Ashley Royalty M.D.   On: 08/07/2017 23:34   Ct Renal Stone Study  Result Date: 08/07/2017 CLINICAL DATA:  Five check, vomiting and diarrhea for 1 week. EXAM: CT ABDOMEN AND PELVIS WITHOUT CONTRAST TECHNIQUE: Multidetector CT imaging of the abdomen and pelvis was performed following the  standard protocol without IV contrast. COMPARISON:  08/07/2013 FINDINGS: Lower chest: The lung bases are clear of acute process. No pleural effusion or pulmonary lesions. The heart is normal in size. No pericardial effusion. The distal esophagus and aorta are unremarkable. Hepatobiliary: No focal hepatic lesions or intrahepatic biliary dilatation. The gallbladder is normal. No common bile duct dilatation. Pancreas: No mass, inflammation or ductal dilatation. Spleen: Normal size.  No focal lesions. Adrenals/Urinary Tract: The adrenal glands and kidneys are unremarkable. No renal, ureteral or bladder calculi or mass. Stomach/Bowel: The stomach, duodenum, small bowel and colon are unremarkable. No acute inflammatory changes, mass lesions or obstructive findings. The terminal ileum is normal. The appendix is normal. Moderate descending and sigmoid colon diverticulosis but no findings for acute diverticulitis. Vascular/Lymphatic: Moderate to advanced atherosclerotic calcifications involving the aorta and iliac arteries. No mesenteric or retroperitoneal mass or adenopathy. Reproductive: The prostate gland is surgically absent. Other: No pelvic mass or adenopathy. No free pelvic fluid collections. No inguinal mass or adenopathy. No abdominal wall hernia or subcutaneous lesions. Musculoskeletal: No acute bony findings. There are a few small stable sclerotic pelvic lesions. Prior RF lumbar surgery changes noted with laminectomies and fixation heart wire. The the L3 pedicle screws are loose. IMPRESSION: 1. No acute abdominal/pelvic findings, mass lesions or adenopathy. 2. No findings for inflammatory bowel process or obstruction. 3. Advanced atherosclerotic calcifications involving the aorta and iliac arteries. 4. Status post prostatectomy. There are few small scattered sclerotic bone lesions which are stable. 5. Loose pedicle screws are noted at L3. Electronically Signed   By: Marijo Sanes M.D.   On: 08/07/2017 20:41      Scheduled Meds: . aspirin EC  81 mg Oral Daily  . esomeprazole  80 mg Oral Q0600  . feeding supplement (ENSURE ENLIVE)  237 mL Oral BID BM  . gabapentin  100 mg Oral QHS  . heparin  5,000 Units Subcutaneous Q8H  . levothyroxine  100 mcg Oral QAC breakfast  . pravastatin  80 mg Oral QPM   Continuous Infusions: . sodium chloride 150 mL/hr at 08/08/17 1135     LOS: 1 day    Time spent: 30 min    Janece Canterbury, MD [facility] Pager [phone]  If 7PM-7AM, please contact night-coverage [email or web address] Password [email or web address] 08/08/2017, 2:00 PM

## 2017-08-08 NOTE — Progress Notes (Signed)
Subjective:  One isolated BP of 81 systolic- since then over 100- had 2500 of UOP !! crt trending down.  Confused says does not feel any different   Objective Vital signs in last 24 hours: Vitals:   08/08/17 0004 08/08/17 0159 08/08/17 0253 08/08/17 0638  BP: (!) 116/58 (!) 93/48 119/76 (!) 109/52  Pulse: 71 78 71 66  Resp: 18 20 19 16   Temp:   97.9 F (36.6 C) 98 F (36.7 C)  TempSrc:   Oral Oral  SpO2: 95% 94% 99% 96%  Weight:   74.8 kg (164 lb 14.5 oz)   Height:   5\' 8"  (1.727 m)    Weight change:   Intake/Output Summary (Last 24 hours) at 08/08/2017 1203 Last data filed at 08/08/2017 1034 Gross per 24 hour  Intake 2815 ml  Output 3000 ml  Net -185 ml    Assessment/ Plan: Pt is a 82 y.o. yo male with HTN, hs prostate CA- last known crt was in the low ones in 2014 who was admitted on 08/07/2017 with weakness, N/V crt 7.35 Assessment/Plan: 1. Renal- presumed AKI although recent renal info not known.  Chart indicates that he may have been on ARB and NSAID as OP- holding those meds.  Thankfully making urine and creatinine coming down. U/A is bland- will continue to follow uop and labs, no dialysis needs 2. HTN/vol- no edema- OK to continue IVF for now 3. Anemia- not an issue 4. Hypokalemia- improved s/p supplement last night   Dynisha Due A    Labs: Basic Metabolic Panel: Recent Labs  Lab 08/07/17 1830 08/07/17 1850 08/07/17 2214 08/08/17 0313  NA 132* 133* 138 136  K 3.2* 3.2* 3.1* 3.4*  CL 84* 86* 94* 96*  CO2 33*  --  29 27  GLUCOSE 144* 140* 62* 192*  BUN 98* 94* 88* 84*  CREATININE 7.35* 7.20* 6.33* 5.66*  5.75*  CALCIUM 7.4*  --  7.5* 7.6*  PHOS  --   --  4.6  --    Liver Function Tests: Recent Labs  Lab 08/07/17 1830 08/07/17 2214 08/08/17 0313  AST 16  --  23  ALT 14*  --  13*  ALKPHOS 91  --  81  BILITOT 2.0*  --  1.6*  PROT 6.2*  --  5.7*  ALBUMIN 3.5 3.4* 3.2*   No results for input(s): LIPASE, AMYLASE in the last 168 hours. No  results for input(s): AMMONIA in the last 168 hours. CBC: Recent Labs  Lab 08/07/17 1830 08/07/17 1850 08/08/17 0313  WBC 16.2*  --  11.8*  NEUTROABS 12.8*  --   --   HGB 13.0 13.3 12.8*  HCT 38.1* 39.0 38.3*  MCV 90.3  --  91.6  PLT 162  --  136*   Cardiac Enzymes: Recent Labs  Lab 08/07/17 2214  CKTOTAL 281   CBG: Recent Labs  Lab 08/08/17 0211 08/08/17 0605 08/08/17 0622 08/08/17 0702 08/08/17 0852  GLUCAP 263* 35* 105* 93 120*    Iron Studies: No results for input(s): IRON, TIBC, TRANSFERRIN, FERRITIN in the last 72 hours. Studies/Results: Dg Chest 2 View  Result Date: 08/07/2017 CLINICAL DATA:  Weakness, nausea, vomiting and diarrhea x1 week. EXAM: CHEST  2 VIEW COMPARISON:  None. FINDINGS: The heart size and mediastinal contours are within normal limits. Minimal aortic atherosclerosis without aneurysm. Both lungs are clear. Osteoarthritis with undersurface spurring is noted about the [facility] joints right greater than left with slightly high riding humeral heads bilaterally that  may reflect chronic rotator cuff tears. IMPRESSION: No active cardiopulmonary disease. Electronically Signed   By: Ashley Royalty M.D.   On: 08/07/2017 17:52   US Renal  Result Date: 08/07/2017 CLINICAL DATA:  Acute renal injury.  Elevated renal function tests. EXAM: RENAL / URINARY TRACT ULTRASOUND COMPLETE COMPARISON:  08/07/2017 CT. FINDINGS: Right Kidney: Length: 9.5 cm. Echogenicity within normal limits. No mass or hydronephrosis visualized. Left Kidney: Length: 10.8 cm. Echogenicity within normal limits. No mass or hydronephrosis visualized. Bladder: Decompressed by Foley catheter. IMPRESSION: 1. Maintained cortical-medullary distinction within both kidneys without obstructive uropathy. 2. Foley decompression of the urinary bladder. Electronically Signed   By: Ashley Royalty M.D.   On: 08/07/2017 23:34   Ct Renal Stone Study  Result Date: 08/07/2017 CLINICAL DATA:  Five check, vomiting and  diarrhea for 1 week. EXAM: CT ABDOMEN AND PELVIS WITHOUT CONTRAST TECHNIQUE: Multidetector CT imaging of the abdomen and pelvis was performed following the standard protocol without IV contrast. COMPARISON:  08/07/2013 FINDINGS: Lower chest: The lung bases are clear of acute process. No pleural effusion or pulmonary lesions. The heart is normal in size. No pericardial effusion. The distal esophagus and aorta are unremarkable. Hepatobiliary: No focal hepatic lesions or intrahepatic biliary dilatation. The gallbladder is normal. No common bile duct dilatation. Pancreas: No mass, inflammation or ductal dilatation. Spleen: Normal size.  No focal lesions. Adrenals/Urinary Tract: The adrenal glands and kidneys are unremarkable. No renal, ureteral or bladder calculi or mass. Stomach/Bowel: The stomach, duodenum, small bowel and colon are unremarkable. No acute inflammatory changes, mass lesions or obstructive findings. The terminal ileum is normal. The appendix is normal. Moderate descending and sigmoid colon diverticulosis but no findings for acute diverticulitis. Vascular/Lymphatic: Moderate to advanced atherosclerotic calcifications involving the aorta and iliac arteries. No mesenteric or retroperitoneal mass or adenopathy. Reproductive: The prostate gland is surgically absent. Other: No pelvic mass or adenopathy. No free pelvic fluid collections. No inguinal mass or adenopathy. No abdominal wall hernia or subcutaneous lesions. Musculoskeletal: No acute bony findings. There are a few small stable sclerotic pelvic lesions. Prior RF lumbar surgery changes noted with laminectomies and fixation heart wire. The the L3 pedicle screws are loose. IMPRESSION: 1. No acute abdominal/pelvic findings, mass lesions or adenopathy. 2. No findings for inflammatory bowel process or obstruction. 3. Advanced atherosclerotic calcifications involving the aorta and iliac arteries. 4. Status post prostatectomy. There are few small scattered  sclerotic bone lesions which are stable. 5. Loose pedicle screws are noted at L3. Electronically Signed   By: Marijo Sanes M.D.   On: 08/07/2017 20:41   Medications: Infusions: . sodium chloride 150 mL/hr at 08/08/17 1135    Scheduled Medications: . aspirin EC  81 mg Oral Daily  . esomeprazole  40 mg Oral Q0600  . feeding supplement (ENSURE ENLIVE)  237 mL Oral BID BM  . gabapentin  100 mg Oral QHS  . heparin  5,000 Units Subcutaneous Q8H  . levothyroxine  100 mcg Oral QAC breakfast  . pravastatin  80 mg Oral QPM    have reviewed scheduled and prn medications.  Physical Exam: General: pleasant alert, somewhat confused but did know it was Thanksgiving- says son puts meds in pill box Heart: RRR Lungs: mostly clear Abdomen: soft, non tender Extremities: no peripheral edema     08/08/2017,12:03 PM  LOS: 1 day

## 2017-08-09 DIAGNOSIS — I9589 Other hypotension: Secondary | ICD-10-CM

## 2017-08-09 LAB — RENAL FUNCTION PANEL
ANION GAP: 5 (ref 5–15)
Albumin: 3.1 g/dL — ABNORMAL LOW (ref 3.5–5.0)
BUN: 47 mg/dL — AB (ref 6–20)
CHLORIDE: 109 mmol/L (ref 101–111)
CO2: 27 mmol/L (ref 22–32)
Calcium: 8.4 mg/dL — ABNORMAL LOW (ref 8.9–10.3)
Creatinine, Ser: 2.67 mg/dL — ABNORMAL HIGH (ref 0.61–1.24)
GFR calc Af Amer: 24 mL/min — ABNORMAL LOW (ref >60)
GFR calc non Af Amer: 21 mL/min — ABNORMAL LOW (ref >60)
GLUCOSE: 63 mg/dL — AB (ref 65–99)
POTASSIUM: 4.1 mmol/L (ref 3.5–5.1)
Phosphorus: 2.6 mg/dL (ref 2.5–4.6)
Sodium: 141 mmol/L (ref 135–145)

## 2017-08-09 LAB — GLUCOSE, CAPILLARY
GLUCOSE-CAPILLARY: 212 mg/dL — AB (ref 65–99)
GLUCOSE-CAPILLARY: 138 mg/dL — AB (ref 65–99)
GLUCOSE-CAPILLARY: 263 mg/dL — AB (ref 65–99)
Glucose-Capillary: 56 mg/dL — ABNORMAL LOW (ref 65–99)
Glucose-Capillary: 79 mg/dL (ref 65–99)
Glucose-Capillary: 68 mg/dL (ref 65–99)
Glucose-Capillary: 87 mg/dL (ref 65–99)

## 2017-08-09 LAB — HEPATITIS B SURFACE ANTIGEN: Hepatitis B Surface Ag: NEGATIVE

## 2017-08-09 LAB — HEPATITIS C ANTIBODY (REFLEX): HCV Ab: <0.1 s/co ratio (ref 0.0–0.9)

## 2017-08-09 LAB — HCV COMMENT:

## 2017-08-09 LAB — URINE CULTURE: CULTURE: NO GROWTH

## 2017-08-09 MED ORDER — INSULIN ASPART 100 UNIT/ML ~~LOC~~ SOLN
0.0000 [IU] | Freq: Three times a day (TID) | SUBCUTANEOUS | Status: DC
  Administered 2017-08-11: 2 [IU] via SUBCUTANEOUS
  Administered 2017-08-11: 3 [IU] via SUBCUTANEOUS
  Administered 2017-08-12: 1 [IU] via SUBCUTANEOUS

## 2017-08-09 NOTE — Progress Notes (Signed)
CRITICAL VALUE ALERT  Critical Value: Blood Glucose 56  Date & Time Notied:  08/09/17;0441  Provider Notified: yes  Orders Received/Actions taken: Hypoglycemic protocol

## 2017-08-09 NOTE — Progress Notes (Signed)
Initial Nutrition Assessment  DOCUMENTATION CODES:   Not applicable  INTERVENTION:   Continue Ensure Enlive po BID, each supplement provides 350 kcal and 20 grams of protein  NUTRITION DIAGNOSIS:   Increased nutrient needs related to acute illness as evidenced by estimated needs.  GOAL:   Patient will meet greater than or equal to 90% of their needs  MONITOR:   PO intake, Supplement acceptance, Labs, I & O's  REASON FOR ASSESSMENT:   Malnutrition Screening Tool    ASSESSMENT:   Pt with PMH of DM, DDD, HTN, GERD, hypothyroidism, PUD presents with N/V and diarrhea found AKI thought to be secondary to dehydration.    Pt confused at time of visit. Spoke with pt and two family members at bedside. Pt's daughter reports pt has a very good appetite at baseline and his hunger is increasing. Reporting he eats "whatever and whenever he pleases".  Per report, pt is not fond of food offerings here. Per pt's daughter pt has not had any weight loss. Per chart review, pt consuming 100% of meals at this time. No recent weight readings available per chart, however pt's weight was 165 lbs four years ago, which he seems to maintain.  Of note, pt with hypoglycemic episode, r/t insulin administration, insulin was discontinued.  Pt denies any current nutrition impact symptoms.   Labs reviewed; CBG 56-289, BUN 47, Creatinine 2.67, Albumin 3.1 Medications reviewed; Nexium  NUTRITION - FOCUSED PHYSICAL EXAM:    Most Recent Value  Orbital Region  No depletion  Upper Arm Region  Mild depletion  Thoracic and Lumbar Region  No depletion  Buccal Region  No depletion  Temple Region  Mild depletion  Clavicle Bone Region  Moderate depletion  Clavicle and Acromion Bone Region  Mild depletion  Scapular Bone Region  Unable to assess  Dorsal Hand  Mild depletion  Patellar Region  Mild depletion  Anterior Thigh Region  Mild depletion  Posterior Calf Region  Mild depletion  Edema (RD Assessment)   None       Diet Order:  Diet regular Room service appropriate? Yes; Fluid consistency: Thin  EDUCATION NEEDS:   Not appropriate for education at this time  Skin:  Skin Assessment: Reviewed RN Assessment  Last BM:  08/06/17  Height:   Ht Readings from Last 1 Encounters:  08/08/17 5\' 8"  (1.727 m)    Weight:   Wt Readings from Last 1 Encounters:  08/08/17 164 lb 14.5 oz (74.8 kg)    Ideal Body Weight:  70 kg  BMI:  Body mass index is 25.07 kg/m.  Estimated Nutritional Needs:   Kcal:  1850-2050  Protein:  115-120 grams  Fluid:  >/= 1.8 L/d  [place] Ranger, MS, RDN, LDN 08/09/2017 1:02 PM

## 2017-08-09 NOTE — Progress Notes (Signed)
Subjective:  No more hypotension- UOP 5700 with 4300 in - very talkative today   Objective Vital signs in last 24 hours: Vitals:   08/08/17 0638 08/08/17 1249 08/08/17 2030 08/09/17 0444  BP: (!) 109/52 119/62 119/74 (!) 141/69  Pulse: 66 72 74 60  Resp: 16 17 16 15   Temp: 98 F (36.7 C) 98.2 F (36.8 C) 98 F (36.7 C) 97.6 F (36.4 C)  TempSrc: Oral Oral Oral Oral  SpO2: 96% 97% 99% 97%  Weight:      Height:       Weight change:   Intake/Output Summary (Last 24 hours) at 08/09/2017 1238 Last data filed at 08/09/2017 0900 Gross per 24 hour  Intake 4345.83 ml  Output 3850 ml  Net 495.83 ml    Assessment/ Plan: Pt is a 82 y.o. yo male with HTN, hs prostate CA- last known crt was in the low ones in 2014 who was admitted on 08/07/2017 with weakness, N/V crt 7.35 Assessment/Plan: 1. Renal- presumed AKI although recent renal info not known.  Chart indicates that he may have been on ARB and NSAID as OP- holding those meds.  Thankfully making urine and creatinine coming down. U/A is bland- is polyuric possibly indicative of a post obstruction diuresis or post injury diuresis, cont relatively high dose IVF to compensate 2. HTN/vol- no edema- OK to continue IVF for now 3. Anemia- not an issue 4. Hypokalemia- improved   Kidney function improved a lot since admission, anticipate cont improvement- will need IVF as long as polyuric but should be slowing down soon.   Renal will sign off, call with questions.  Would not put him back on NSAIDS or ARB at D/C- he does not need renal specific follow up   [place]: Basic Metabolic Panel: Recent Labs  Lab 08/07/17 2214 08/08/17 0313 08/09/17 0451  NA 138 136 141  K 3.1* 3.4* 4.1  CL 94* 96* 109  CO2 29 27 27   GLUCOSE 62* 192* 63*  BUN 88* 84* 47*  CREATININE 6.33* 5.66*  5.75* 2.67*  CALCIUM 7.5* 7.6* 8.4*  PHOS 4.6  --  2.6   Liver Function Tests: Recent Labs  Lab 08/07/17 1830 08/07/17 2214  08/08/17 0313 08/09/17 0451  AST 16  --  23  --   ALT 14*  --  13*  --   ALKPHOS 91  --  81  --   BILITOT 2.0*  --  1.6*  --   PROT 6.2*  --  5.7*  --   ALBUMIN 3.5 3.4* 3.2* 3.1*   No results for input(s): LIPASE, AMYLASE in the last 168 hours. No results for input(s): AMMONIA in the last 168 hours. CBC: Recent Labs  Lab 08/07/17 1830 08/07/17 1850 08/08/17 0313  WBC 16.2*  --  11.8*  NEUTROABS 12.8*  --   --   HGB 13.0 13.3 12.8*  HCT 38.1* 39.0 38.3*  MCV 90.3  --  91.6  PLT 162  --  136*   Cardiac Enzymes: Recent Labs  Lab 08/07/17 2214  CKTOTAL 281   CBG: Recent Labs  Lab 08/08/17 2032 08/09/17 0441 08/09/17 0507 08/09/17 0815 08/09/17 1132  GLUCAP 156* 56* 79 212* 138*    Iron Studies: No results for input(s): IRON, TIBC, TRANSFERRIN, FERRITIN in the last 72 hours. Studies/Results: Dg Chest 2 View  Result Date: 08/07/2017 CLINICAL DATA:  Weakness, nausea, vomiting and diarrhea x1 week. EXAM: CHEST  2 VIEW COMPARISON:  None.  FINDINGS: The heart size and mediastinal contours are within normal limits. Minimal aortic atherosclerosis without aneurysm. Both lungs are clear. Osteoarthritis with undersurface spurring is noted about the [facility] joints right greater than left with slightly high riding humeral heads bilaterally that may reflect chronic rotator cuff tears. IMPRESSION: No active cardiopulmonary disease. Electronically Signed   By: Ashley Royalty M.D.   On: 08/07/2017 17:52   US Renal  Result Date: 08/07/2017 CLINICAL DATA:  Acute renal injury.  Elevated renal function tests. EXAM: RENAL / URINARY TRACT ULTRASOUND COMPLETE COMPARISON:  08/07/2017 CT. FINDINGS: Right Kidney: Length: 9.5 cm. Echogenicity within normal limits. No mass or hydronephrosis visualized. Left Kidney: Length: 10.8 cm. Echogenicity within normal limits. No mass or hydronephrosis visualized. Bladder: Decompressed by Foley catheter. IMPRESSION: 1. Maintained cortical-medullary distinction  within both kidneys without obstructive uropathy. 2. Foley decompression of the urinary bladder. Electronically Signed   By: Ashley Royalty M.D.   On: 08/07/2017 23:34   Ct Renal Stone Study  Result Date: 08/07/2017 CLINICAL DATA:  Five check, vomiting and diarrhea for 1 week. EXAM: CT ABDOMEN AND PELVIS WITHOUT CONTRAST TECHNIQUE: Multidetector CT imaging of the abdomen and pelvis was performed following the standard protocol without IV contrast. COMPARISON:  08/07/2013 FINDINGS: Lower chest: The lung bases are clear of acute process. No pleural effusion or pulmonary lesions. The heart is normal in size. No pericardial effusion. The distal esophagus and aorta are unremarkable. Hepatobiliary: No focal hepatic lesions or intrahepatic biliary dilatation. The gallbladder is normal. No common bile duct dilatation. Pancreas: No mass, inflammation or ductal dilatation. Spleen: Normal size.  No focal lesions. Adrenals/Urinary Tract: The adrenal glands and kidneys are unremarkable. No renal, ureteral or bladder calculi or mass. Stomach/Bowel: The stomach, duodenum, small bowel and colon are unremarkable. No acute inflammatory changes, mass lesions or obstructive findings. The terminal ileum is normal. The appendix is normal. Moderate descending and sigmoid colon diverticulosis but no findings for acute diverticulitis. Vascular/Lymphatic: Moderate to advanced atherosclerotic calcifications involving the aorta and iliac arteries. No mesenteric or retroperitoneal mass or adenopathy. Reproductive: The prostate gland is surgically absent. Other: No pelvic mass or adenopathy. No free pelvic fluid collections. No inguinal mass or adenopathy. No abdominal wall hernia or subcutaneous lesions. Musculoskeletal: No acute bony findings. There are a few small stable sclerotic pelvic lesions. Prior RF lumbar surgery changes noted with laminectomies and fixation heart wire. The the L3 pedicle screws are loose. IMPRESSION: 1. No acute  abdominal/pelvic findings, mass lesions or adenopathy. 2. No findings for inflammatory bowel process or obstruction. 3. Advanced atherosclerotic calcifications involving the aorta and iliac arteries. 4. Status post prostatectomy. There are few small scattered sclerotic bone lesions which are stable. 5. Loose pedicle screws are noted at L3. Electronically Signed   By: Marijo Sanes M.D.   On: 08/07/2017 20:41   Medications: Infusions: . sodium chloride 150 mL/hr at 08/08/17 1729    Scheduled Medications: . aspirin EC  81 mg Oral Daily  . esomeprazole  80 mg Oral Q0600  . feeding supplement (ENSURE ENLIVE)  237 mL Oral BID BM  . gabapentin  100 mg Oral QHS  . heparin  5,000 Units Subcutaneous Q8H  . levothyroxine  100 mcg Oral QAC breakfast  . pravastatin  80 mg Oral QPM    have reviewed scheduled and prn medications.  Physical Exam: General: pleasant alert, somewhat confused but did know it was Thanksgiving- says son puts meds in pill box Heart: RRR Lungs: mostly clear Abdomen: soft, non  tender Extremities: no peripheral edema     08/09/2017,12:38 PM  LOS: 2 days

## 2017-08-09 NOTE — Progress Notes (Signed)
PROGRESS NOTE  Roger Hanson XKG:[id number] DOB: 09-Oct-1934 DOA: 08/07/2017 PCP: Derinda Late, MD   LOS: 2 days   Brief Narrative / Interim history: The patient is an 82 year old male with history of prostate cancer status post prostatectomy, hypothyroidism, hypertension, diabetes mellitus, hyperlipidemia who was brought to the hospital after he had problems with nausea, vomiting, and diarrhea.  He had very little oral intake and became very weak.  When they brought him to the emergency department he had acute kidney injury.  His creatinine was 7.20 with a BUN of 94.  He did not have a notable metabolic acidosis nor was he hyperkalemic.  Nephrology was consulted and he has been started on IV fluids.  He has had brisk urine output over the last 24 hours and his creatinine has started to trend down.  Assessment & Plan: Active Problems:   AKI (acute kidney injury) ([place])   Acute kidney injury  -secondary to dehydration from his Foley and diarrhea. -Looks like he may have been on ARB and NSAIDs as an outpatient, -On IV fluids, creatinine improving nicely in the 2 range from as high as 7.35 on admission -He is polyuric, continue IV fluids, continue to hold HCTZ and Cozaar  Hypokalemia -Within normal limits this morning, continue to monitor  Diabetes mellitus -Hypoglycemic after admission, probably due to renal failure and insulin administration -Add back sliding scale insulin as his kidney function is now better sugars in the 200s range -Most recent A1c was 7.2, which shows good control especially in his age group  Hypothyroidism -sable, continue Synthroid  Hypertension -blood pressures have been low, continue to hold Cozaar and HCTZ  Hyperlipidemia -stable, continue pravastatin   DVT prophylaxis: Heparin Code Status: Full code Family Communication: no family at bedside Disposition Plan: home once Cr improves  Consultants:   Nephrology   Procedures:   None    Antimicrobials:  None    Subjective: - no chest pain, shortness of breath, no abdominal pain, nausea or vomiting.   Objective: Vitals:   08/08/17 0638 08/08/17 1249 08/08/17 2030 08/09/17 0444  BP: (!) 109/52 119/62 119/74 (!) 141/69  Pulse: 66 72 74 60  Resp: 16 17 16 15   Temp: 98 F (36.7 C) 98.2 F (36.8 C) 98 F (36.7 C) 97.6 F (36.4 C)  TempSrc: Oral Oral Oral Oral  SpO2: 96% 97% 99% 97%  Weight:      Height:        Intake/Output Summary (Last 24 hours) at 08/09/2017 1416 Last data filed at 08/09/2017 0900 Gross per 24 hour  Intake 4345.83 ml  Output 3850 ml  Net 495.83 ml   Filed Weights   08/07/17 1713 08/08/17 0253  Weight: 74.8 kg (165 lb) 74.8 kg (164 lb 14.5 oz)    Examination:  Constitutional: NAD Eyes:  lids and conjunctivae normal ENMT: Mucous membranes are moist. Respiratory: clear to auscultation bilaterally, no wheezing, no crackles. Cardiovascular: Regular rate and rhythm, no murmurs / rubs / gallops. No LE edema.  Abdomen: no tenderness. Bowel sounds positive.  Neurologic: CN 2-12 grossly intact. Strength 5/5 in all 4.  Psychiatric: Normal judgment and insight. Alert and oriented x 3. Normal mood.    Data Reviewed: I have independently reviewed following labs and imaging studies   CBC: Recent Labs  Lab 08/07/17 1830 08/07/17 1850 08/08/17 0313  WBC 16.2*  --  11.8*  NEUTROABS 12.8*  --   --   HGB 13.0 13.3 12.8*  HCT 38.1* 39.0 38.3*  MCV 90.3  --  91.6  PLT 162  --  696*   Basic Metabolic Panel: Recent Labs  Lab 08/07/17 1830 08/07/17 1850 08/07/17 2214 08/08/17 0313 08/09/17 0451  NA 132* 133* 138 136 141  K 3.2* 3.2* 3.1* 3.4* 4.1  CL 84* 86* 94* 96* 109  CO2 33*  --  29 27 27   GLUCOSE 144* 140* 62* 192* 63*  BUN 98* 94* 88* 84* 47*  CREATININE 7.35* 7.20* 6.33* 5.66*  5.75* 2.67*  CALCIUM 7.4*  --  7.5* 7.6* 8.4*  PHOS  --   --  4.6  --  2.6   GFR: Estimated Creatinine Clearance: 21.3 mL/min (A) (by C-G  formula based on SCr of 2.67 mg/dL (H)). Liver Function Tests: Recent Labs  Lab 08/07/17 1830 08/07/17 2214 08/08/17 0313 08/09/17 0451  AST 16  --  23  --   ALT 14*  --  13*  --   ALKPHOS 91  --  81  --   BILITOT 2.0*  --  1.6*  --   PROT 6.2*  --  5.7*  --   ALBUMIN 3.5 3.4* 3.2* 3.1*   No results for input(s): LIPASE, AMYLASE in the last 168 hours. No results for input(s): AMMONIA in the last 168 hours. Coagulation Profile: No results for input(s): INR, PROTIME in the last 168 hours. Cardiac Enzymes: Recent Labs  Lab 08/07/17 2214  CKTOTAL 281   BNP (last 3 results) No results for input(s): PROBNP in the last 8760 hours. HbA1C: Recent Labs    08/07/17 2338  HGBA1C 7.2*   CBG: Recent Labs  Lab 08/08/17 2032 08/09/17 0441 08/09/17 0507 08/09/17 0815 08/09/17 1132  GLUCAP 156* 56* 79 212* 138*   Lipid Profile: No results for input(s): CHOL, HDL, LDLCALC, TRIG, CHOLHDL, LDLDIRECT in the last 72 hours. Thyroid Function Tests: No results for input(s): TSH, T4TOTAL, FREET4, T3FREE, THYROIDAB in the last 72 hours. Anemia Panel: No results for input(s): VITAMINB12, FOLATE, FERRITIN, TIBC, IRON, RETICCTPCT in the last 72 hours. Urine analysis:    Component Value Date/Time   COLORURINE AMBER (A) 08/07/2017 1830   APPEARANCEUR HAZY (A) 08/07/2017 1830   LABSPEC 1.016 08/07/2017 1830   PHURINE 5.0 08/07/2017 1830   GLUCOSEU 50 (A) 08/07/2017 1830   HGBUR SMALL (A) 08/07/2017 1830   BILIRUBINUR NEGATIVE 08/07/2017 1830   [place] 08/07/2017 1830   PROTEINUR NEGATIVE 08/07/2017 1830   UROBILINOGEN 0.2 08/07/2013 1305   NITRITE NEGATIVE 08/07/2017 1830   LEUKOCYTESUR NEGATIVE 08/07/2017 1830   Sepsis Labs: Invalid input(s): PROCALCITONIN, LACTICIDVEN  Recent Results (from the past 240 hour(s))  Urine culture     Status: None   Collection Time: 08/07/17  6:30 PM  Result Value Ref Range Status   Specimen Description URINE, CATHETERIZED  Final    Special Requests NONE  Final   Culture   Final    NO GROWTH Performed at [facility], [address], [place], [place] [address]    Report Status 08/09/2017 FINAL  Final      Radiology Studies: Dg Chest 2 View  Result Date: 08/07/2017 CLINICAL DATA:  Weakness, nausea, vomiting and diarrhea x1 week. EXAM: CHEST  2 VIEW COMPARISON:  None. FINDINGS: The heart size and mediastinal contours are within normal limits. Minimal aortic atherosclerosis without aneurysm. Both lungs are clear. Osteoarthritis with undersurface spurring is noted about the [facility] joints right greater than left with slightly high riding humeral heads bilaterally that may reflect chronic rotator  cuff tears. IMPRESSION: No active cardiopulmonary disease. Electronically Signed   By: Ashley Royalty M.D.   On: 08/07/2017 17:52   US Renal  Result Date: 08/07/2017 CLINICAL DATA:  Acute renal injury.  Elevated renal function tests. EXAM: RENAL / URINARY TRACT ULTRASOUND COMPLETE COMPARISON:  08/07/2017 CT. FINDINGS: Right Kidney: Length: 9.5 cm. Echogenicity within normal limits. No mass or hydronephrosis visualized. Left Kidney: Length: 10.8 cm. Echogenicity within normal limits. No mass or hydronephrosis visualized. Bladder: Decompressed by Foley catheter. IMPRESSION: 1. Maintained cortical-medullary distinction within both kidneys without obstructive uropathy. 2. Foley decompression of the urinary bladder. Electronically Signed   By: Ashley Royalty M.D.   On: 08/07/2017 23:34   Ct Renal Stone Study  Result Date: 08/07/2017 CLINICAL DATA:  Five check, vomiting and diarrhea for 1 week. EXAM: CT ABDOMEN AND PELVIS WITHOUT CONTRAST TECHNIQUE: Multidetector CT imaging of the abdomen and pelvis was performed following the standard protocol without IV contrast. COMPARISON:  08/07/2013 FINDINGS: Lower chest: The lung bases are clear of acute process. No pleural effusion or pulmonary lesions. The heart is normal in size. No pericardial  effusion. The distal esophagus and aorta are unremarkable. Hepatobiliary: No focal hepatic lesions or intrahepatic biliary dilatation. The gallbladder is normal. No common bile duct dilatation. Pancreas: No mass, inflammation or ductal dilatation. Spleen: Normal size.  No focal lesions. Adrenals/Urinary Tract: The adrenal glands and kidneys are unremarkable. No renal, ureteral or bladder calculi or mass. Stomach/Bowel: The stomach, duodenum, small bowel and colon are unremarkable. No acute inflammatory changes, mass lesions or obstructive findings. The terminal ileum is normal. The appendix is normal. Moderate descending and sigmoid colon diverticulosis but no findings for acute diverticulitis. Vascular/Lymphatic: Moderate to advanced atherosclerotic calcifications involving the aorta and iliac arteries. No mesenteric or retroperitoneal mass or adenopathy. Reproductive: The prostate gland is surgically absent. Other: No pelvic mass or adenopathy. No free pelvic fluid collections. No inguinal mass or adenopathy. No abdominal wall hernia or subcutaneous lesions. Musculoskeletal: No acute bony findings. There are a few small stable sclerotic pelvic lesions. Prior RF lumbar surgery changes noted with laminectomies and fixation heart wire. The the L3 pedicle screws are loose. IMPRESSION: 1. No acute abdominal/pelvic findings, mass lesions or adenopathy. 2. No findings for inflammatory bowel process or obstruction. 3. Advanced atherosclerotic calcifications involving the aorta and iliac arteries. 4. Status post prostatectomy. There are few small scattered sclerotic bone lesions which are stable. 5. Loose pedicle screws are noted at L3. Electronically Signed   By: Marijo Sanes M.D.   On: 08/07/2017 20:41     Scheduled Meds: . aspirin EC  81 mg Oral Daily  . esomeprazole  80 mg Oral Q0600  . feeding supplement (ENSURE ENLIVE)  237 mL Oral BID BM  . gabapentin  100 mg Oral QHS  . heparin  5,000 Units Subcutaneous  Q8H  . levothyroxine  100 mcg Oral QAC breakfast  . pravastatin  80 mg Oral QPM   Continuous Infusions: . sodium chloride 150 mL/hr at 08/08/17 [place], MD, PhD [facility] Pager [phone]  If 7PM-7AM, please contact night-coverage [email or web address] Password [email or web address] 08/09/2017, 2:16 PM

## 2017-08-10 LAB — BASIC METABOLIC PANEL
Anion gap: 5 (ref 5–15)
BUN: 29 mg/dL — AB (ref 6–20)
CALCIUM: 8.8 mg/dL — AB (ref 8.9–10.3)
CO2: 26 mmol/L (ref 22–32)
CREATININE: 1.83 mg/dL — AB (ref 0.61–1.24)
Chloride: 110 mmol/L (ref 101–111)
GFR calc Af Amer: 38 mL/min — ABNORMAL LOW (ref >60)
GFR calc non Af Amer: 33 mL/min — ABNORMAL LOW (ref >60)
GLUCOSE: 195 mg/dL — AB (ref 65–99)
Potassium: 4 mmol/L (ref 3.5–5.1)
Sodium: 141 mmol/L (ref 135–145)

## 2017-08-10 LAB — GLUCOSE, CAPILLARY
GLUCOSE-CAPILLARY: 121 mg/dL — AB (ref 65–99)
Glucose-Capillary: 104 mg/dL — ABNORMAL HIGH (ref 65–99)
Glucose-Capillary: 171 mg/dL — ABNORMAL HIGH (ref 65–99)
Glucose-Capillary: 119 mg/dL — ABNORMAL HIGH (ref 65–99)
Glucose-Capillary: 210 mg/dL — ABNORMAL HIGH (ref 65–99)

## 2017-08-10 NOTE — Progress Notes (Signed)
PROGRESS NOTE  Roger Hanson:[id number] DOB: 01-08-1935 DOA: 08/07/2017 PCP: Derinda Late, MD   LOS: 3 days   Brief Narrative / Interim history: The patient is an 82 year old male with history of prostate cancer status post prostatectomy, hypothyroidism, hypertension, diabetes mellitus, hyperlipidemia who was brought to the hospital after he had problems with nausea, vomiting, and diarrhea.  He had very little oral intake and became very weak.  When they brought him to the emergency department he had acute kidney injury.  His creatinine was 7.20 with a BUN of 94.  He did not have a notable metabolic acidosis nor was he hyperkalemic.  Nephrology was consulted and he has been started on IV fluids.  He has had brisk urine output over the last 24 hours and his creatinine has started to trend down.  Assessment & Plan: Active Problems:   AKI (acute kidney injury) ([place])   Acute kidney injury  -secondary to dehydration from his diarrhea. -Looks like he may have been on ARB and NSAIDs as an outpatient, -On IV fluids, creatinine improving, 1.8 today -He is polyuric, continue IV fluids, continue to hold HCTZ and Cozaar  Hypokalemia -Within normal limits this morning, continue to monitor  Diabetes mellitus -Hypoglycemic after admission, probably due to renal failure and insulin administration -Add back sliding scale insulin as his kidney function is now better sugars in the 200s range -Most recent A1c was 7.2, which shows good control especially in his age group  Hypothyroidism -sable, continue Synthroid  Hypertension -blood pressures have been low, continue to hold Cozaar and HCTZ  Hyperlipidemia -stable, continue pravastatin   DVT prophylaxis: Heparin Code Status: Full code Family Communication: no family at bedside Disposition Plan: home once Cr improves  Consultants:   Nephrology   Procedures:   None   Antimicrobials:  None    Subjective: - no chest pain,  shortness of breath, no abdominal pain, nausea or vomiting. Very talkative today   Objective: Vitals:   08/09/17 0444 08/09/17 1606 08/09/17 2145 08/10/17 0500  BP: (!) 141/69 (!) 148/75 (!) 119/58 139/73  Pulse: 60 86 74 71  Resp: 15 16 18 18   Temp: 97.6 F (36.4 C) 98.4 F (36.9 C) 98.3 F (36.8 C) 98.2 F (36.8 C)  TempSrc: Oral Oral Oral Oral  SpO2: 97% 96% 100% 100%  Weight:      Height:        Intake/Output Summary (Last 24 hours) at 08/10/2017 1353 Last data filed at 08/10/2017 1100 Gross per 24 hour  Intake 3692.5 ml  Output 6400 ml  Net -2707.5 ml   Filed Weights   08/07/17 1713 08/08/17 0253  Weight: 74.8 kg (165 lb) 74.8 kg (164 lb 14.5 oz)    Examination:  Constitutional: NAD Respiratory: CTA biL Cardiovascular: RRR  Data Reviewed: I have independently reviewed following labs and imaging studies   CBC: Recent Labs  Lab 08/07/17 1830 08/07/17 1850 08/08/17 0313  WBC 16.2*  --  11.8*  NEUTROABS 12.8*  --   --   HGB 13.0 13.3 12.8*  HCT 38.1* 39.0 38.3*  MCV 90.3  --  91.6  PLT 162  --  500*   Basic Metabolic Panel: Recent Labs  Lab 08/07/17 1830 08/07/17 1850 08/07/17 2214 08/08/17 0313 08/09/17 0451 08/10/17 0432  NA 132* 133* 138 136 141 141  K 3.2* 3.2* 3.1* 3.4* 4.1 4.0  CL 84* 86* 94* 96* 109 110  CO2 33*  --  29 27 27 26   GLUCOSE  144* 140* 62* 192* 63* 195*  BUN 98* 94* 88* 84* 47* 29*  CREATININE 7.35* 7.20* 6.33* 5.66*  5.75* 2.67* 1.83*  CALCIUM 7.4*  --  7.5* 7.6* 8.4* 8.8*  PHOS  --   --  4.6  --  2.6  --    GFR: Estimated Creatinine Clearance: 31.1 mL/min (A) (by C-G formula based on SCr of 1.83 mg/dL (H)). Liver Function Tests: Recent Labs  Lab 08/07/17 1830 08/07/17 2214 08/08/17 0313 08/09/17 0451  AST 16  --  23  --   ALT 14*  --  13*  --   ALKPHOS 91  --  81  --   BILITOT 2.0*  --  1.6*  --   PROT 6.2*  --  5.7*  --   ALBUMIN 3.5 3.4* 3.2* 3.1*   No results for input(s): LIPASE, AMYLASE in the last 168  hours. No results for input(s): AMMONIA in the last 168 hours. Coagulation Profile: No results for input(s): INR, PROTIME in the last 168 hours. Cardiac Enzymes: Recent Labs  Lab 08/07/17 2214  CKTOTAL 281   BNP (last 3 results) No results for input(s): PROBNP in the last 8760 hours. HbA1C: Recent Labs    08/07/17 2338  HGBA1C 7.2*   CBG: Recent Labs  Lab 08/09/17 1723 08/09/17 2009 08/10/17 0303 08/10/17 0737 08/10/17 1139  GLUCAP 87 263* 121* 104* 171*   Lipid Profile: No results for input(s): CHOL, HDL, LDLCALC, TRIG, CHOLHDL, LDLDIRECT in the last 72 hours. Thyroid Function Tests: No results for input(s): TSH, T4TOTAL, FREET4, T3FREE, THYROIDAB in the last 72 hours. Anemia Panel: No results for input(s): VITAMINB12, FOLATE, FERRITIN, TIBC, IRON, RETICCTPCT in the last 72 hours. Urine analysis:    Component Value Date/Time   COLORURINE AMBER (A) 08/07/2017 1830   APPEARANCEUR HAZY (A) 08/07/2017 1830   LABSPEC 1.016 08/07/2017 1830   PHURINE 5.0 08/07/2017 1830   GLUCOSEU 50 (A) 08/07/2017 1830   HGBUR SMALL (A) 08/07/2017 1830   BILIRUBINUR NEGATIVE 08/07/2017 1830   [place] 08/07/2017 1830   PROTEINUR NEGATIVE 08/07/2017 1830   UROBILINOGEN 0.2 08/07/2013 1305   NITRITE NEGATIVE 08/07/2017 1830   LEUKOCYTESUR NEGATIVE 08/07/2017 1830   Sepsis Labs: Invalid input(s): PROCALCITONIN, LACTICIDVEN  Recent Results (from the past 240 hour(s))  Urine culture     Status: None   Collection Time: 08/07/17  6:30 PM  Result Value Ref Range Status   Specimen Description URINE, CATHETERIZED  Final   Special Requests NONE  Final   Culture   Final    NO GROWTH Performed at [facility], [address], [place], [place] [address]    Report Status 08/09/2017 FINAL  Final      Radiology Studies: No results found.   Scheduled Meds: . aspirin EC  81 mg Oral Daily  . esomeprazole  80 mg Oral Q0600  . feeding supplement (ENSURE ENLIVE)  237 mL  Oral BID BM  . gabapentin  100 mg Oral QHS  . heparin  5,000 Units Subcutaneous Q8H  . insulin aspart  0-9 Units Subcutaneous TID WC  . levothyroxine  100 mcg Oral QAC breakfast  . pravastatin  80 mg Oral QPM   Continuous Infusions: . sodium chloride 150 mL/hr at 08/10/17 8299     Marzetta Board, MD, PhD [facility] Pager [phone]  If 7PM-7AM, please contact night-coverage [email or web address] Password [email or web address] 08/10/2017, 1:53 PM

## 2017-08-10 NOTE — Evaluation (Signed)
Physical Therapy Evaluation Patient Details Name: Roger Hanson MRN: [id number] DOB: 05-05-1935 Today's Date: 08/10/2017   History of Present Illness  The patient is an 82 year old male with history of prostate cancer status post prostatectomy, hypothyroidism, hypertension, diabetes mellitus, hyperlipidemia who was brought to the hospital after he had problems with nausea, vomiting, and diarrhea.. Found to have acute kidney injury.  Clinical Impression  The patient ambulated x 200' with Rw. Patient did not use AD PTA. Should progress to level to retrun home.     Follow Up Recommendations No PT follow up    Equipment Recommendations  None recommended by PT    Recommendations for Other Services       Precautions / Restrictions Precautions Precautions: Fall      Mobility  Bed Mobility               General bed mobility comments: in recliner  Transfers Overall transfer level: Needs assistance Equipment used: None Transfers: Sit to/from Stand Sit to Stand: Supervision            Ambulation/Gait Ambulation/Gait assistance: Min guard Ambulation Distance (Feet): 200 Feet Assistive device: Rolling walker (2 wheeled) Gait Pattern/deviations: Step-through pattern     General Gait Details: noted mild decreased dorsiflexion and gr. toe extension during swing.  Stairs            Wheelchair Mobility    Modified Rankin (Stroke Patients Only)       Balance Overall balance assessment: Needs assistance Sitting-balance support: No upper extremity supported;Feet supported Sitting balance-Leahy Scale: Normal     Standing balance support: No upper extremity supported;During functional activity Standing balance-Leahy Scale: Fair Standing balance comment: no noted balance loss                             Pertinent Vitals/Pain Pain Assessment: No/denies pain    Home Living Family/patient expects to be discharged to:: Private residence Living  Arrangements: Children Available Help at Discharge: Family Type of Home: House Home Access: Stairs to enter Entrance Stairs-Rails: Right Entrance Stairs-Number of Steps: 5 Home Layout: Two level;Bed/bath upstairs Home Equipment: Environmental consultant - 2 wheels      Prior Function Level of Independence: Independent               Hand Dominance        Extremity/Trunk Assessment   Upper Extremity Assessment Upper Extremity Assessment: Overall WFL for tasks assessed    Lower Extremity Assessment RLE Deficits / Details: great toe extension 3    Cervical / Trunk Assessment Cervical / Trunk Assessment: Kyphotic  Communication   Communication: No difficulties  Cognition Arousal/Alertness: Awake/alert Behavior During Therapy: WFL for tasks assessed/performed Overall Cognitive Status: No family/caregiver present to determine baseline cognitive functioning                                 General Comments: patient states that he did not know if he had eaten      General Comments      Exercises     Assessment/Plan    PT Assessment Patient needs continued PT services  PT Problem List Decreased activity tolerance;Decreased mobility       PT Treatment Interventions Gait training;Stair training;Functional mobility training    PT Goals (Current goals can be found in the Care Plan section)  Acute Rehab PT Goals Patient Stated Goal: to go  home PT Goal Formulation: With patient Time For Goal Achievement: 08/17/17 Potential to Achieve Goals: Good    Frequency Min 2X/week   Barriers to discharge        Co-evaluation               AM-PAC PT "6 Clicks" Daily Activity  Outcome Measure Difficulty turning over in bed (including adjusting bedclothes, sheets and blankets)?: None Difficulty moving from lying on back to sitting on the side of the bed? : None Difficulty sitting down on and standing up from a chair with arms (e.g., wheelchair, bedside commode,  etc,.)?: None Help needed moving to and from a bed to chair (including a wheelchair)?: None Help needed walking in hospital room?: A Little Help needed climbing 3-5 steps with a railing? : A Little 6 Click Score: 22    End of Session   Activity Tolerance: Patient tolerated treatment well Patient left: in chair;with call bell/phone within reach;with chair alarm set Nurse Communication: Mobility status PT Visit Diagnosis: Difficulty in walking, not elsewhere classified (R26.2)    Time: 4656-8127 PT Time Calculation (min) (ACUTE ONLY): 15 min   Charges:   PT Evaluation $PT Eval Low Complexity: 1 Low     PT G CodesTresa Endo PT [phone]  Claretha Cooper 08/10/2017, 12:40 PM

## 2017-08-11 LAB — GLUCOSE, CAPILLARY
GLUCOSE-CAPILLARY: 173 mg/dL — AB (ref 65–99)
GLUCOSE-CAPILLARY: 119 mg/dL — AB (ref 65–99)
Glucose-Capillary: 79 mg/dL (ref 65–99)
Glucose-Capillary: 97 mg/dL (ref 65–99)
Glucose-Capillary: 249 mg/dL — ABNORMAL HIGH (ref 65–99)

## 2017-08-11 LAB — BASIC METABOLIC PANEL
Anion gap: 6 (ref 5–15)
BUN: 20 mg/dL (ref 6–20)
CHLORIDE: 113 mmol/L — AB (ref 101–111)
CO2: 23 mmol/L (ref 22–32)
Calcium: 9.1 mg/dL (ref 8.9–10.3)
Creatinine, Ser: 1.62 mg/dL — ABNORMAL HIGH (ref 0.61–1.24)
GFR calc Af Amer: 45 mL/min — ABNORMAL LOW (ref >60)
GFR calc non Af Amer: 38 mL/min — ABNORMAL LOW (ref >60)
Glucose, Bld: 93 mg/dL (ref 65–99)
Potassium: 4.3 mmol/L (ref 3.5–5.1)
Sodium: 142 mmol/L (ref 135–145)

## 2017-08-11 NOTE — Progress Notes (Signed)
PROGRESS NOTE  Roger Hanson ACZ:[id number] DOB: [birth year] DOA: 08/07/2017 PCP: Derinda Late, MD   LOS: 4 days   Brief Narrative / Interim history: The patient is an 82 year old male with history of prostate cancer status post prostatectomy, hypothyroidism, hypertension, diabetes mellitus, hyperlipidemia who was brought to the hospital after he had problems with nausea, vomiting, and diarrhea.  He had very little oral intake and became very weak.  When they brought him to the emergency department he had acute kidney injury.  His creatinine was 7.20 with a BUN of 94.  He did not have a notable metabolic acidosis nor was he hyperkalemic.  Nephrology was consulted and he has been started on IV fluids.  He has had brisk urine output over the last 24 hours and his creatinine has started to trend down.  Assessment & Plan: Active Problems:   AKI (acute kidney injury) ([place])   Acute kidney injury  -secondary to dehydration from his diarrhea. -Looks like he may have been on ARB and NSAIDs as an outpatient, which will need to be discontinued on discharge -Nephrology consulted, signed off 11/22. -His renal function is recovering, he is in the polyuric phase, urine output for yesterday 5.6 L, he is starting to slow down this morning, will decrease IV fluids from 150 to 100 cc/h -Continue to monitor creatinine, when his polyuric phase is better may be able to go home  Hypokalemia -Within normal limits this morning, continue to monitor  Diabetes mellitus -Hypoglycemic after admission, probably due to renal failure and insulin administration -Added back sliding scale insulin 11/22 as his kidney function is now better -CBGs range of 70s-90s today, continue current regimen -Most recent A1c was 7.2, which shows good control especially in his age group  Hypothyroidism -sable, continue Synthroid  Hypertension -blood pressures have been low initially, continue to hold Cozaar and HCTZ given AKI    -blood pressure this morning 140/78, continue to hold off BP meds, can consider Norvasc/hydralazine if needed  Hyperlipidemia -stable, continue pravastatin   DVT prophylaxis: Heparin Code Status: Full code Family Communication: no family at bedside, discussed with daughter Sharyn Lull over the phone Disposition Plan: home once Cr improves  Consultants:   Nephrology   Procedures:   None   Antimicrobials:  None    Subjective: -Feels very good this morning, no chest pain, no abdominal pain, nausea or vomiting  Objective: Vitals:   08/10/17 0500 08/10/17 1403 08/10/17 2114 08/11/17 0537  BP: 139/73 (!) 143/75 (!) 164/80 140/78  Pulse: 71 72 77 74  Resp: 18 18 18 16   Temp: 98.2 F (36.8 C) 98.1 F (36.7 C) 97.8 F (36.6 C) 98.3 F (36.8 C)  TempSrc: Oral Oral Oral Oral  SpO2: 100% 100% 96% 97%  Weight:      Height:        Intake/Output Summary (Last 24 hours) at 08/11/2017 1142 Last data filed at 08/11/2017 0930 Gross per 24 hour  Intake 3240 ml  Output 4575 ml  Net -1335 ml   Filed Weights   08/07/17 1713 08/08/17 0253  Weight: 74.8 kg (165 lb) 74.8 kg (164 lb 14.5 oz)    Examination:  Constitutional: NAD Respiratory: CTA Cardiovascular: RRR  Data Reviewed: I have independently reviewed following labs and imaging studies   CBC: Recent Labs  Lab 08/07/17 1830 08/07/17 1850 08/08/17 0313  WBC 16.2*  --  11.8*  NEUTROABS 12.8*  --   --   HGB 13.0 13.3 12.8*  HCT 38.1* 39.0 38.3*  MCV 90.3  --  91.6  PLT 162  --  557*   Basic Metabolic Panel: Recent Labs  Lab 08/07/17 2214 08/08/17 0313 08/09/17 0451 08/10/17 0432 08/11/17 0502  NA 138 136 141 141 142  K 3.1* 3.4* 4.1 4.0 4.3  CL 94* 96* 109 110 113*  CO2 29 27 27 26 23   GLUCOSE 62* 192* 63* 195* 93  BUN 88* 84* 47* 29* 20  CREATININE 6.33* 5.66*  5.75* 2.67* 1.83* 1.62*  CALCIUM 7.5* 7.6* 8.4* 8.8* 9.1  PHOS 4.6  --  2.6  --   --    GFR: Estimated Creatinine Clearance: 35.2  mL/min (A) (by C-G formula based on SCr of 1.62 mg/dL (H)). Liver Function Tests: Recent Labs  Lab 08/07/17 1830 08/07/17 2214 08/08/17 0313 08/09/17 0451  AST 16  --  23  --   ALT 14*  --  13*  --   ALKPHOS 91  --  81  --   BILITOT 2.0*  --  1.6*  --   PROT 6.2*  --  5.7*  --   ALBUMIN 3.5 3.4* 3.2* 3.1*   No results for input(s): LIPASE, AMYLASE in the last 168 hours. No results for input(s): AMMONIA in the last 168 hours. Coagulation Profile: No results for input(s): INR, PROTIME in the last 168 hours. Cardiac Enzymes: Recent Labs  Lab 08/07/17 2214  CKTOTAL 281   BNP (last 3 results) No results for input(s): PROBNP in the last 8760 hours. HbA1C: No results for input(s): HGBA1C in the last 72 hours. CBG: Recent Labs  Lab 08/10/17 1139 08/10/17 1639 08/10/17 2120 08/11/17 0303 08/11/17 0759  GLUCAP 171* 119* 210* 79 97   Lipid Profile: No results for input(s): CHOL, HDL, LDLCALC, TRIG, CHOLHDL, LDLDIRECT in the last 72 hours. Thyroid Function Tests: No results for input(s): TSH, T4TOTAL, FREET4, T3FREE, THYROIDAB in the last 72 hours. Anemia Panel: No results for input(s): VITAMINB12, FOLATE, FERRITIN, TIBC, IRON, RETICCTPCT in the last 72 hours. Urine analysis:    Component Value Date/Time   COLORURINE AMBER (A) 08/07/2017 1830   APPEARANCEUR HAZY (A) 08/07/2017 1830   LABSPEC 1.016 08/07/2017 1830   PHURINE 5.0 08/07/2017 1830   GLUCOSEU 50 (A) 08/07/2017 1830   HGBUR SMALL (A) 08/07/2017 1830   BILIRUBINUR NEGATIVE 08/07/2017 1830   [place] 08/07/2017 1830   PROTEINUR NEGATIVE 08/07/2017 1830   UROBILINOGEN 0.2 08/07/2013 1305   NITRITE NEGATIVE 08/07/2017 1830   LEUKOCYTESUR NEGATIVE 08/07/2017 1830   Sepsis Labs: Invalid input(s): PROCALCITONIN, LACTICIDVEN  Recent Results (from the past 240 hour(s))  Urine culture     Status: None   Collection Time: 08/07/17  6:30 PM  Result Value Ref Range Status   Specimen Description URINE,  CATHETERIZED  Final   Special Requests NONE  Final   Culture   Final    NO GROWTH Performed at [facility], [address], [place], [place] [address]    Report Status 08/09/2017 FINAL  Final      Radiology Studies: No results found.   Scheduled Meds: . aspirin EC  81 mg Oral Daily  . esomeprazole  80 mg Oral Q0600  . feeding supplement (ENSURE ENLIVE)  237 mL Oral BID BM  . gabapentin  100 mg Oral QHS  . heparin  5,000 Units Subcutaneous Q8H  . insulin aspart  0-9 Units Subcutaneous TID WC  . levothyroxine  100 mcg Oral QAC breakfast  . pravastatin  80 mg Oral QPM  Continuous Infusions: . sodium chloride 100 mL/hr at 08/11/17 0809     Marzetta Board, MD, PhD [facility] Pager [phone]  If 7PM-7AM, please contact night-coverage [email or web address] Password [facility] 08/11/2017, 11:42 AM

## 2017-08-12 DIAGNOSIS — N179 Acute kidney failure, unspecified: Principal | ICD-10-CM

## 2017-08-12 DIAGNOSIS — E861 Hypovolemia: Secondary | ICD-10-CM

## 2017-08-12 LAB — PARATHYROID HORMONE, INTACT (NO CA): PTH: 123 pg/mL — AB (ref 15–65)

## 2017-08-12 LAB — GLUCOSE, CAPILLARY
Glucose-Capillary: 99 mg/dL (ref 65–99)
Glucose-Capillary: 131 mg/dL — ABNORMAL HIGH (ref 65–99)

## 2017-08-12 LAB — BASIC METABOLIC PANEL
Anion gap: 5 (ref 5–15)
BUN: 17 mg/dL (ref 6–20)
CALCIUM: 9.2 mg/dL (ref 8.9–10.3)
CHLORIDE: 112 mmol/L — AB (ref 101–111)
CO2: 23 mmol/L (ref 22–32)
CREATININE: 1.48 mg/dL — AB (ref 0.61–1.24)
GFR calc Af Amer: 50 mL/min — ABNORMAL LOW (ref >60)
GFR calc non Af Amer: 43 mL/min — ABNORMAL LOW (ref >60)
Glucose, Bld: 113 mg/dL — ABNORMAL HIGH (ref 65–99)
Potassium: 4.2 mmol/L (ref 3.5–5.1)
SODIUM: 140 mmol/L (ref 135–145)

## 2017-08-12 MED ORDER — GLIMEPIRIDE 1 MG PO TABS: 1.0000 mg | ORAL_TABLET | Freq: Two times a day (BID) | ORAL | 0 refills | Status: DC

## 2017-08-12 MED ORDER — AMLODIPINE BESYLATE 10 MG PO TABS
10.0000 mg | ORAL_TABLET | Freq: Every day | ORAL | Status: DC
  Administered 2017-08-12: 10 mg via ORAL
  Filled 2017-08-12: qty 1

## 2017-08-12 MED ORDER — AMLODIPINE BESYLATE 10 MG PO TABS: 10.0000 mg | ORAL_TABLET | Freq: Every day | ORAL | 0 refills | Status: DC

## 2017-08-12 NOTE — Discharge Instructions (Signed)
Follow with Primary MD Derinda Late, MD in 3 days   Get CBC, CMP,  checked  by Primary MD in 3 days   Activity: As tolerated with Full fall precautions use walker/cane & assistance as needed  Disposition Home    Diet:  Heart Healthy  Low Carb  Accuchecks 4 times/day, Once in AM empty stomach and then before each meal. Log in all results and show them to your Prim.MD in 3 days. If any glucose reading is under 80 or above 300 call your Prim MD immidiately. Follow Low glucose instructions for glucose under 80 as instructed.   For Heart failure patients - Check your Weight same time everyday, if you gain over 2 pounds, or you develop in leg swelling, experience more shortness of breath or chest pain, call your Primary MD immediately. Follow Cardiac Low Salt Diet and 1.5 lit/day fluid restriction.  On your next visit with your primary care physician please Get Medicines reviewed and adjusted.  Please request your Prim.MD to go over all Hospital Tests and Procedure/Radiological results at the follow up, please get all Hospital records sent to your Prim MD by signing hospital release before you go home.  If you experience worsening of your admission symptoms, develop shortness of breath, life threatening emergency, suicidal or homicidal thoughts you must seek medical attention immediately by calling 911 or calling your MD immediately  if symptoms less severe.  You Must read complete instructions/literature along with all the possible adverse reactions/side effects for all the Medicines you take and that have been prescribed to you. Take any new Medicines after you have completely understood and accpet all the possible adverse reactions/side effects.

## 2017-08-12 NOTE — Care Management Note (Signed)
Case Management Note  Patient Details  Name: BENICIO MANNA MRN: [id number] Date of Birth: December 07, 1934  Subjective/Objective:82 y/o m admitted w/AKI. From home.PT cons-await recc.                    Action/Plan:d/c home.   Expected Discharge Date:  08/12/17               Expected Discharge Plan:  Home/Self Care  In-House Referral:     Discharge planning Services  CM Consult  Post Acute Care Choice:    Choice offered to:     DME Arranged:    DME Agency:     HH Arranged:    HH Agency:     Status of Service:  Completed, signed off  If discussed at [employer] of Stay Meetings, dates discussed:    Additional Comments:  Dessa Phi, RN 08/12/2017, 10:38 AM

## 2017-08-12 NOTE — Care Management Important Message (Signed)
Important Message  Patient Details  Name: Roger Hanson MRN: [id number] Date of Birth: 1935/05/04   Medicare Important Message Given:  Yes    [name] 08/12/2017, 11:46 AMImportant Message  Patient Details  Name: Roger Hanson MRN: [id number] Date of Birth: 1934-10-18   Medicare Important Message Given:  Yes    [name] 08/12/2017, 11:46 AM

## 2017-08-12 NOTE — Discharge Summary (Signed)
Roger Hanson TDD:[id number] DOB: [birth year] DOA: 08/07/2017  PCP: Derinda Late, MD  Admit date: 08/07/2017  Discharge date: 08/12/2017  Admitted From: Home   Disposition:  Home   Recommendations for Outpatient Follow-up:   Follow up with PCP in 1-2 weeks  PCP Please obtain BMP/CBC, 2 view CXR in 1week,  (see Discharge instructions)   PCP Please follow up on the following pending results: monitor CBGs, BMP, review CT report   Home Health: none Equipment/Devices: none  Consultations: Renal Discharge Condition: Stable   CODE STATUS:    Diet Recommendation: Heart Healthy Low Carb   Chief Complaint  Patient presents with  . Hypotension     Brief history of present illness from the day of admission and additional interim summary    The patient is an 82 year old male with history of prostate cancer status post prostatectomy, hypothyroidism, hypertension, diabetes mellitus, hyperlipidemia who was brought to the hospital after he had problems with nausea, vomiting, and diarrhea. He had very little oral intake and became very weak. When they brought him to the emergency department he had acute kidney injury. His creatinine was 7.20 with a BUN of 94. He did not have a notable metabolic acidosis nor was he hyperkalemic. Nephrology was consulted and he has been started on IV fluids. He has had brisk urine output over the last 24 hours and his creatinine has started to trend down.                                                                  Hospital Course    ARF - due to dehydration caused by gastroenteritis and diarrhea, he was also taking ARB, NSAIDs and HCTZ outpatient, creatinine peaked around 7.5, he was hydrated seen by nephrologist, the CT scan and renal ultrasound were nonacute, with supportive  care renal function has improved, he has excellent urine output.  We will discontinue Foley catheter, continue to hold nephrotoxic medications upon discharge, close outpatient follow-up with PCP within the next 2-3 days.  Request PCP to continue monitoring BMP along with CBGs closely.    Diabetes mellitus 2 - was hypoglycemic after admission, probably due to renal failure and insulin administration along with oral hypoglycemics at home, currently on sliding scale and CBGs under 100, will discontinue all oral hypoglycemics upon discharge except Tradjenta, to follow with PCP closely, requested to do CBGs q. before meals at bedtime and showed results to PCP Visit within 2-3 days.  His A1c was 7.2.   Hypothyroidism -sable, continue Synthroid  Hypertension - blood pressures have been low initially, continue to hold Cozaar and HCTZ given ARF, hold offending medications we will switch him on Norvasc upon discharge.   Hyperlipidemia - stable, continue pravastatin     Discharge diagnosis     Active Problems:  AKI (acute kidney injury) ([place])   Hypovolemia    Discharge instructions    Discharge Instructions    Discharge instructions   Complete by:  As directed    Follow with Primary MD Derinda Late, MD in 3 days   Get CBC, CMP,  checked  by Primary MD in 3 days   Activity: As tolerated with Full fall precautions use walker/cane & assistance as needed  Disposition Home    Diet:  Heart Healthy  Low Carb  Accuchecks 4 times/day, Once in AM empty stomach and then before each meal. Log in all results and show them to your Prim.MD in 3 days. If any glucose reading is under 80 or above 300 call your Prim MD immidiately. Follow Low glucose instructions for glucose under 80 as instructed.   For Heart failure patients - Check your Weight same time everyday, if you gain over 2 pounds, or you develop in leg swelling, experience more shortness of breath or chest pain, call your Primary MD  immediately. Follow Cardiac Low Salt Diet and 1.5 lit/day fluid restriction.  On your next visit with your primary care physician please Get Medicines reviewed and adjusted.  Please request your Prim.MD to go over all Hospital Tests and Procedure/Radiological results at the follow up, please get all Hospital records sent to your Prim MD by signing hospital release before you go home.  If you experience worsening of your admission symptoms, develop shortness of breath, life threatening emergency, suicidal or homicidal thoughts you must seek medical attention immediately by calling 911 or calling your MD immediately  if symptoms less severe.  You Must read complete instructions/literature along with all the possible adverse reactions/side effects for all the Medicines you take and that have been prescribed to you. Take any new Medicines after you have completely understood and accpet all the possible adverse reactions/side effects.   Increase activity slowly   Complete by:  As directed       Discharge Medications   Allergies as of 08/12/2017      Reactions   Glipizide Other (See Comments)   Erythema multiforme   Lovastatin    Erythema multiforme   Omeprazole Nausea And Vomiting   Protonix [pantoprazole Sodium] Nausea Only      Medication List    STOP taking these medications   celecoxib 200 MG capsule Commonly known as:  CELEBREX   ciprofloxacin 500 MG tablet Commonly known as:  CIPRO   glimepiride 4 MG tablet Commonly known as:  AMARYL   glyBURIDE 5 MG tablet Commonly known as:  DIABETA   hydrochlorothiazide 25 MG tablet Commonly known as:  HYDRODIURIL   losartan 100 MG tablet Commonly known as:  COZAAR   metroNIDAZOLE 500 MG tablet Commonly known as:  FLAGYL     TAKE these medications   ALPRAZolam 0.5 MG tablet Commonly known as:  XANAX Take 0.5 mg by mouth at bedtime as needed for anxiety.   amLODipine 10 MG tablet Commonly known as:  NORVASC Take 1 tablet  (10 mg total) by mouth daily.   aspirin 81 MG tablet Take 81 mg by mouth daily.   cholecalciferol 1000 units tablet Commonly known as:  VITAMIN D Take 2,000 Units by mouth daily.   ESOMEPRAZOLE MAGNESIUM PO Take 22.3 mg by mouth every morning.   gabapentin 300 MG capsule Commonly known as:  NEURONTIN Take 300 mg by mouth at bedtime.   HYDROcodone-acetaminophen 5-325 MG tablet Commonly known as:  NORCO/VICODIN Take 1 tablet by  mouth every 6 (six) hours as needed for moderate pain.   Levothyroxine Sodium 100 MCG Caps Take 100 mcg by mouth daily before breakfast.   ondansetron 4 MG tablet Commonly known as:  ZOFRAN Take 4 mg by mouth 2 (two) times daily as needed for nausea or vomiting.   pravastatin 80 MG tablet Commonly known as:  PRAVACHOL Take 80 mg by mouth every evening.   TRADJENTA 5 MG Tabs tablet Generic drug:  linagliptin Take 5 mg by mouth every morning.       Follow-up Information    Derinda Late, MD. Schedule an appointment as soon as possible for a visit in 3 day(s).   Specialty:  Family Medicine Contact information: [address] [place] [address] [phone]           Major procedures and Radiology Reports - PLEASE review detailed and final reports thoroughly  -         Dg Chest 2 View  Result Date: 08/07/2017 CLINICAL DATA:  Weakness, nausea, vomiting and diarrhea x1 week. EXAM: CHEST  2 VIEW COMPARISON:  None. FINDINGS: The heart size and mediastinal contours are within normal limits. Minimal aortic atherosclerosis without aneurysm. Both lungs are clear. Osteoarthritis with undersurface spurring is noted about the [facility] joints right greater than left with slightly high riding humeral heads bilaterally that may reflect chronic rotator cuff tears. IMPRESSION: No active cardiopulmonary disease. Electronically Signed   By: Ashley Royalty M.D.   On: 08/07/2017 17:52   US Renal  Result Date: 08/07/2017 CLINICAL DATA:  Acute renal  injury.  Elevated renal function tests. EXAM: RENAL / URINARY TRACT ULTRASOUND COMPLETE COMPARISON:  08/07/2017 CT. FINDINGS: Right Kidney: Length: 9.5 cm. Echogenicity within normal limits. No mass or hydronephrosis visualized. Left Kidney: Length: 10.8 cm. Echogenicity within normal limits. No mass or hydronephrosis visualized. Bladder: Decompressed by Foley catheter. IMPRESSION: 1. Maintained cortical-medullary distinction within both kidneys without obstructive uropathy. 2. Foley decompression of the urinary bladder. Electronically Signed   By: Ashley Royalty M.D.   On: 08/07/2017 23:34   Ct Renal Stone Study  Result Date: 08/07/2017 CLINICAL DATA:  Five check, vomiting and diarrhea for 1 week. EXAM: CT ABDOMEN AND PELVIS WITHOUT CONTRAST TECHNIQUE: Multidetector CT imaging of the abdomen and pelvis was performed following the standard protocol without IV contrast. COMPARISON:  08/07/2013 FINDINGS: Lower chest: The lung bases are clear of acute process. No pleural effusion or pulmonary lesions. The heart is normal in size. No pericardial effusion. The distal esophagus and aorta are unremarkable. Hepatobiliary: No focal hepatic lesions or intrahepatic biliary dilatation. The gallbladder is normal. No common bile duct dilatation. Pancreas: No mass, inflammation or ductal dilatation. Spleen: Normal size.  No focal lesions. Adrenals/Urinary Tract: The adrenal glands and kidneys are unremarkable. No renal, ureteral or bladder calculi or mass. Stomach/Bowel: The stomach, duodenum, small bowel and colon are unremarkable. No acute inflammatory changes, mass lesions or obstructive findings. The terminal ileum is normal. The appendix is normal. Moderate descending and sigmoid colon diverticulosis but no findings for acute diverticulitis. Vascular/Lymphatic: Moderate to advanced atherosclerotic calcifications involving the aorta and iliac arteries. No mesenteric or retroperitoneal mass or adenopathy. Reproductive: The  prostate gland is surgically absent. Other: No pelvic mass or adenopathy. No free pelvic fluid collections. No inguinal mass or adenopathy. No abdominal wall hernia or subcutaneous lesions. Musculoskeletal: No acute bony findings. There are a few small stable sclerotic pelvic lesions. Prior RF lumbar surgery changes noted with laminectomies and fixation heart wire.  The the L3 pedicle screws are loose. IMPRESSION: 1. No acute abdominal/pelvic findings, mass lesions or adenopathy. 2. No findings for inflammatory bowel process or obstruction. 3. Advanced atherosclerotic calcifications involving the aorta and iliac arteries. 4. Status post prostatectomy. There are few small scattered sclerotic bone lesions which are stable. 5. Loose pedicle screws are noted at L3. Electronically Signed   By: Marijo Sanes M.D.   On: 08/07/2017 20:41    Micro Results     Recent Results (from the past 240 hour(s))  Urine culture     Status: None   Collection Time: 08/07/17  6:30 PM  Result Value Ref Range Status   Specimen Description URINE, CATHETERIZED  Final   Special Requests NONE  Final   Culture   Final    NO GROWTH Performed at [facility], [address] [place], [place] [address]    Report Status 08/09/2017 FINAL  Final    Today   Subjective    Roger Hanson today has no headache,no chest abdominal pain,no new weakness tingling or numbness, feels much better wants to go home today.    Objective   Blood pressure (!) 154/70, pulse 74, temperature 98 F (36.7 C), temperature source Oral, resp. rate 16, height 5\' 8"  (1.727 m), weight 74.8 kg (164 lb 14.5 oz), SpO2 95 %.   Intake/Output Summary (Last 24 hours) at 08/12/2017 1032 Last data filed at 08/12/2017 0900 Gross per 24 hour  Intake 2290 ml  Output 3300 ml  Net -1010 ml    Exam Awake Alert, Oriented x 3, No new F.N deficits, Normal affect [place].AT,PERRAL Supple Neck,No JVD, No cervical lymphadenopathy appriciated.  Symmetrical  Chest wall movement, Good air movement bilaterally, CTAB RRR,No Gallops,Rubs or new Murmurs, No Parasternal Heave +ve B.Sounds, Abd Soft, Non tender, No organomegaly appriciated, No rebound -guarding or rigidity. No Cyanosis, Clubbing or edema, No new Rash or bruise   Data Review   CBC w Diff:  Lab Results  Component Value Date   WBC 11.8 (H) 08/08/2017   HGB 12.8 (L) 08/08/2017   HCT 38.3 (L) 08/08/2017   PLT 136 (L) 08/08/2017   LYMPHOPCT 12 08/07/2017   MONOPCT 8 08/07/2017   EOSPCT 0 08/07/2017   BASOPCT 0 08/07/2017    CMP:  Lab Results  Component Value Date   NA 140 08/12/2017   K 4.2 08/12/2017   CL 112 (H) 08/12/2017   CO2 23 08/12/2017   BUN 17 08/12/2017   CREATININE 1.48 (H) 08/12/2017   PROT 5.7 (L) 08/08/2017   ALBUMIN 3.1 (L) 08/09/2017   BILITOT 1.6 (H) 08/08/2017   ALKPHOS 81 08/08/2017   AST 23 08/08/2017   ALT 13 (L) 08/08/2017  .   Total Time in preparing paper work, data evaluation and todays exam - 61 minutes  Lala Lund M.D on 08/12/2017 at 10:32 AM  [facility]   Office  [phone]

## 2017-08-12 NOTE — Progress Notes (Signed)
Noted PT no f/u. No further CM needs.

## 2017-08-12 NOTE — Progress Notes (Signed)
Physical Therapy Treatment Patient Details Name: Roger Hanson MRN: [id number] DOB: 1935-03-13 Today's Date: 08/12/2017    History of Present Illness The patient is an 82 year old male with history of prostate cancer status post prostatectomy, hypothyroidism, hypertension, diabetes mellitus, hyperlipidemia who was brought to the hospital after he had problems with nausea, vomiting, and diarrhea.. Found to have acute kidney injury.    PT Comments    Ambulated 50 feet to stairs with RW, performed full flight of stairs with hand rail on L, patient used B UE's on rail, ascended with an alternating pattern, descended with a step to pattern. Patient required VC's for safety to step into walker at the top and bottom of the stairs. Ambulated 50 feet back to room with no AD, patient required min guard and VC's for safety to slow down gait speed.    Follow Up Recommendations  No PT follow up     Equipment Recommendations  None recommended by PT    Recommendations for Other Services       Precautions / Restrictions Precautions Precautions: Fall Restrictions Weight Bearing Restrictions: No    Mobility  Bed Mobility Overal bed mobility: Needs Assistance Bed Mobility: Supine to Sit;Sit to Supine     Supine to sit: Min guard Sit to supine: Min guard   General bed mobility comments: for safety  Transfers Overall transfer level: Needs assistance Equipment used: Rolling walker (2 wheeled) Transfers: Sit to/from Stand Sit to Stand: Supervision            Ambulation/Gait Ambulation/Gait assistance: Min guard Ambulation Distance (Feet): 100 Feet Assistive device: Rolling walker (2 wheeled);None Gait Pattern/deviations: Step-through pattern;Narrow base of support     General Gait Details: ambulated 50 feet with walker to stairs, returned 50 feet with no AD, noted no recip arm swing or trunk rotation with a narrow base of support   Stairs Stairs: Yes   Stair Management:  One rail Left;Step to pattern;Alternating pattern;Forwards Number of Stairs: 10 General stair comments: full flight of stairs with hand rail on L, ascended with alternating pattern with B UE on hand rail, descended with a step to pattern with B UEs on hand rail.   Wheelchair Mobility    Modified Rankin (Stroke Patients Only)       Balance                                            Cognition Arousal/Alertness: Awake/alert Behavior During Therapy: WFL for tasks assessed/performed Overall Cognitive Status: No family/caregiver present to determine baseline cognitive functioning                                        Exercises      General Comments        Pertinent Vitals/Pain Pain Assessment: No/denies pain    Home Living                      Prior Function            PT Goals (current goals can now be found in the care plan section)      Frequency    Min 2X/week      PT Plan      Co-evaluation  AM-PAC PT "6 Clicks" Daily Activity  Outcome Measure  Difficulty turning over in bed (including adjusting bedclothes, sheets and blankets)?: A Little Difficulty moving from lying on back to sitting on the side of the bed? : A Little Difficulty sitting down on and standing up from a chair with arms (e.g., wheelchair, bedside commode, etc,.)?: None Help needed moving to and from a bed to chair (including a wheelchair)?: A Little Help needed walking in hospital room?: A Little Help needed climbing 3-5 steps with a railing? : A Little 6 Click Score: 19    End of Session Equipment Utilized During Treatment: Gait belt Activity Tolerance: Patient tolerated treatment well Patient left: with call bell/phone within reach;with chair alarm set;in chair Nurse Communication: Mobility status PT Visit Diagnosis: Difficulty in walking, not elsewhere classified (R26.2)     Time: 5670-1410 PT Time Calculation (min)  (ACUTE ONLY): 18 min  Charges:  $Gait Training: 8-22 mins                    G Codes:       Almond Lint, SPTA [facility]  PTA [facility]  Acute  Rehab Pager      [phone]

## 2017-08-20 ENCOUNTER — Other Ambulatory Visit (HOSPITAL_COMMUNITY): Payer: Self-pay | Admitting: Family Medicine

## 2017-08-20 DIAGNOSIS — R197 Diarrhea, unspecified: Principal | ICD-10-CM

## 2017-08-20 DIAGNOSIS — R1110 Vomiting, unspecified: Secondary | ICD-10-CM

## 2017-08-23 ENCOUNTER — Encounter: Payer: Self-pay | Admitting: Gastroenterology

## 2017-09-02 ENCOUNTER — Encounter (HOSPITAL_COMMUNITY): Payer: Medicare Other

## 2017-09-03 ENCOUNTER — Ambulatory Visit (HOSPITAL_COMMUNITY): Payer: Medicare Other

## 2017-09-04 ENCOUNTER — Ambulatory Visit (HOSPITAL_COMMUNITY)
Admission: RE | Admit: 2017-09-04 | Discharge: 2017-09-04 | Disposition: A | Discharge status: Home / Self Care | Payer: Medicare Other | Source: Ambulatory Visit | Attending: Family Medicine | Admitting: Family Medicine

## 2017-09-04 DIAGNOSIS — R197 Diarrhea, unspecified: Secondary | ICD-10-CM | POA: Insufficient documentation

## 2017-09-04 DIAGNOSIS — I700 Atherosclerosis of aorta: Secondary | ICD-10-CM | POA: Diagnosis not present

## 2017-09-04 DIAGNOSIS — R935 Abnormal findings on diagnostic imaging of other abdominal regions, including retroperitoneum: Secondary | ICD-10-CM | POA: Insufficient documentation

## 2017-09-04 DIAGNOSIS — R1110 Vomiting, unspecified: Secondary | ICD-10-CM | POA: Insufficient documentation

## 2017-09-09 ENCOUNTER — Encounter (HOSPITAL_COMMUNITY)
Admission: RE | Admit: 2017-09-09 | Discharge: 2017-09-09 | Disposition: A | Discharge status: Home / Self Care | Payer: Medicare Other | Source: Ambulatory Visit | Attending: Family Medicine | Admitting: Family Medicine

## 2017-09-09 DIAGNOSIS — R1110 Vomiting, unspecified: Secondary | ICD-10-CM | POA: Diagnosis present

## 2017-09-09 DIAGNOSIS — R197 Diarrhea, unspecified: Secondary | ICD-10-CM | POA: Insufficient documentation

## 2017-09-09 MED ORDER — TECHNETIUM TC 99M SULFUR COLLOID
2.0000 | Freq: Once | INTRAVENOUS | Status: AC | PRN
  Administered 2017-09-09: 2 via ORAL

## 2017-09-11 ENCOUNTER — Ambulatory Visit (HOSPITAL_COMMUNITY): Payer: Medicare Other

## 2017-09-11 LAB — GLUCOSE, CAPILLARY
GLUCOSE-CAPILLARY: 50 mg/dL — AB (ref 65–99)
GLUCOSE-CAPILLARY: 68 mg/dL (ref 65–99)

## 2017-10-14 ENCOUNTER — Ambulatory Visit (INDEPENDENT_AMBULATORY_CARE_PROVIDER_SITE_OTHER): Payer: Medicare Other | Admitting: Gastroenterology

## 2017-10-14 ENCOUNTER — Encounter: Payer: Self-pay | Admitting: Gastroenterology

## 2017-10-14 VITALS — BP 110/60 | HR 70 | Ht 68.0 in | Wt 154.0 lb

## 2017-10-14 DIAGNOSIS — R112 Nausea with vomiting, unspecified: Secondary | ICD-10-CM | POA: Diagnosis not present

## 2017-10-14 DIAGNOSIS — R197 Diarrhea, unspecified: Secondary | ICD-10-CM

## 2017-10-14 DIAGNOSIS — K219 Gastro-esophageal reflux disease without esophagitis: Secondary | ICD-10-CM | POA: Diagnosis not present

## 2017-10-14 NOTE — Progress Notes (Signed)
History of Present Illness: This is an 83 year old male referred by Derinda Late, MD for the evaluation of intermittent recurrent vomiting and intermittent diarrhea.  He is accompanied by his daughter.  He was hospitalized in November for dehydration and acute kidney injury and his antihypertensive regimen was changed.  He relates long-term reflux symptoms which have come under much better control after increasing Nexium to twice daily.  He describes intermittent episodes of vomiting and he freely admits to self inducing on almost all occasions when he feels nauseated.  He has intermittent diarrhea that he believes is related to food intolerances which does not correlate at the times of his vomiting symptoms.  Since his discharge from the hospital and increasing Nexium to twice daily he has not had any episodes of nausea or vomiting.  He feels his reflux symptoms are under very good control.  Upper GI series as below.  Gastric emptying scan performed in December 2018 was normal.  He has problems with intermittent constipation and is taking Colace 3 times a day.  He states he has not had significant problems with diarrhea in several weeks however today he had a slightly looser stool.  Denies weight loss, abdominal pain, change in stool caliber, melena, hematochezia, dysphagia, chest pain.   UGI series 08/2017 1. Very prominent gastric rugal folds in the fundus of the stomach. The appearance is nonspecific. Differential diagnosis includes chronic gastric/lymphoid hyperplasia, gastric malignancy, benign tumors, Zollinger-Ellison syndrome, and Menetrier's disease. 2. Prominence of the mucosal folds in the second portion of the duodenum consistent with duodenitis. 3. No ulcers. 4. Aortic atherosclerosis.  Allergies  Allergen Reactions  . Glipizide Other (See Comments)    Erythema multiforme  . Lovastatin     Erythema multiforme  . Omeprazole Nausea And Vomiting  . Protonix [Pantoprazole  Sodium] Nausea Only   Outpatient Medications Prior to Visit  Medication Sig Dispense Refill  . ALPRAZolam (XANAX) 0.5 MG tablet Take 0.5 mg by mouth at bedtime as needed for anxiety.    Marland Kitchen amLODipine (NORVASC) 10 MG tablet Take 1 tablet (10 mg total) by mouth daily. 30 tablet 0  . aspirin 81 MG tablet Take 81 mg by mouth daily.    . cholecalciferol (VITAMIN D) 1000 UNITS tablet Take 2,000 Units by mouth daily.    Marland Kitchen ESOMEPRAZOLE MAGNESIUM PO Take 22.3 mg by mouth every morning.     Marland Kitchen glimepiride (AMARYL) 4 MG tablet Take 4 mg by mouth 2 (two) times daily.     . Levothyroxine Sodium 100 MCG CAPS Take 100 mcg by mouth daily before breakfast.     . ondansetron (ZOFRAN) 4 MG tablet Take 4 mg by mouth 2 (two) times daily as needed for nausea or vomiting.    . pravastatin (PRAVACHOL) 80 MG tablet Take 80 mg by mouth every evening.     . TRADJENTA 5 MG TABS tablet Take 5 mg by mouth every morning.  11  . gabapentin (NEURONTIN) 300 MG capsule Take 300 mg by mouth at bedtime.    Marland Kitchen glipiZIDE (GLUCOTROL XL) 2.5 MG 24 hr tablet Take 2.5 mg by mouth 2 (two) times daily.    Marland Kitchen HYDROcodone-acetaminophen (NORCO/VICODIN) 5-325 MG per tablet Take 1 tablet by mouth every 6 (six) hours as needed for moderate pain.     No facility-administered medications prior to visit.    Past Medical History:  Diagnosis Date  . Anxiety   . DDD (degenerative disc disease), lumbosacral   . Diabetes  mellitus without complication ([place])   . Diverticulosis   . GERD (gastroesophageal reflux disease)   . Hearing loss   . Hypercholesteremia   . Hypertension   . Hypothyroidism   . Insomnia   . Lumbar radiculopathy   . Prostate cancer ([place])   . Psoriasis   . PUD (peptic ulcer disease)   . Pulmonary nodules   . Restless legs syndrome (RLS)   . Vitamin D deficiency disease    Past Surgical History:  Procedure Laterality Date  . ANKLE SURGERY  2005  . ARTHROSCOPY KNEE W/ DRILLING Right 2007  . CARPAL TUNNEL RELEASE  Bilateral 1999  . HEMORROIDECTOMY  1980  . LUMBAR FUSION  2013  . PROSTATECTOMY  2005  . SQUAMOUS CELL CARCINOMA EXCISION  2008   from scrotum   Social History   Socioeconomic History  . Marital status: Widowed    Spouse name: None  . Number of children: None  . Years of education: None  . Highest education level: None  Social Needs  . Financial resource strain: None  . Food insecurity - worry: None  . Food insecurity - inability: None  . Transportation needs - medical: None  . Transportation needs - non-medical: None  Occupational History  . None  Tobacco Use  . Smoking status: Former Research scientist (life sciences)  . Smokeless tobacco: Never Used  Substance and Sexual Activity  . Alcohol use: No  . Drug use: No  . Sexual activity: Yes    Partners: Female  Other Topics Concern  . None  Social History Narrative  . None   Family History  Problem Relation Age of Onset  . CVA Mother   . Breast cancer Mother   . Diabetes Father   . Diabetes Brother   . Hypertension Brother       Review of Systems: Pertinent positive and negative review of systems were noted in the above HPI section. All other review of systems were otherwise negative.    Physical Exam: General: Well developed, well nourished, no acute distress Head: Normocephalic and atraumatic Eyes:  sclerae anicteric, EOMI Ears: Normal auditory acuity Mouth: No deformity or lesions Neck: Supple, no masses or thyromegaly Lungs: Clear throughout to auscultation Heart: Regular rate and rhythm; no murmurs, rubs or bruits Abdomen: Soft, non tender and non distended. No masses, hepatosplenomegaly or hernias noted. Normal Bowel sounds Rectal: not done Musculoskeletal: Symmetrical with no gross deformities  Skin: No lesions on visible extremities Pulses:  Normal pulses noted Extremities: No clubbing, cyanosis, edema or deformities noted Neurological: Alert oriented x 4, grossly nonfocal Cervical Nodes:  No significant cervical  adenopathy Inguinal Nodes: No significant inguinal adenopathy Psychological:  Alert and cooperative. Normal mood and affect  Assessment and Recommendations:  1.  Recurrent, intermittent nausea and vomiting.  Vomiting is typically self-induced when nausea develops.  His symptoms have not occurred since Nexium was increased to 40 mg twice daily. Chronic GERD that was not adequately controlled was likely leading to nausea and self-induced vomiting.  I recommended proceeding with EGD to evaluate for esophagitis, Barrett's esophagus and to further evaluate thickened rugal folds on UGI series. He declines at this time but will further consider it with his daughter.  He states if he was actively symptomatic he would likely agree to proceed with EGD and he states he is of the mentality that if it's not broke it does not need to be fixed.  2.  Prominent rugal folds in the gastric fundus.  Differential of a few  potential causes listed in the UGI series report. EGD recommended as above.  Patient declined.  3.  Intermittent, mild diarrhea.  Occasional constipation.  Decrease Colace to once or twice daily for now.  Advised to keep a food diary and assess if there is a pattern leading to diarrhea.  If diarrhea worsens will be available for further workup.  4. CRC screening, average risk.  A 10-year interval colonoscopy is due in September 2019.  He was advised that around age 33 is when we typically discontinue routine screening colonoscopies however since his health is good it is quite reasonable to proceed with colonoscopy later this year.  He states he will consider this.   cc: Derinda Late, MD [address] [place], [place] [address]

## 2017-10-14 NOTE — Patient Instructions (Addendum)
Patient advised to avoid spicy, acidic, citrus, chocolate, mints, fruit and fruit juices.  Limit the intake of caffeine, alcohol and Soda.  Don't exercise too soon after eating.  Don't lie down within 3-4 hours of eating.  Elevate the head of your bed.  Stay on acid reflux medication twice daily.   Call back if you want further testing.   Thank you for choosing me and [facility].  Pricilla Riffle. Dagoberto Ligas., MD., [facility]

## 2017-10-29 ENCOUNTER — Ambulatory Visit (AMBULATORY_SURGERY_CENTER): Payer: Self-pay

## 2017-10-29 ENCOUNTER — Other Ambulatory Visit: Payer: Self-pay

## 2017-10-29 VITALS — Ht 68.0 in | Wt 154.8 lb

## 2017-10-29 DIAGNOSIS — R112 Nausea with vomiting, unspecified: Secondary | ICD-10-CM

## 2017-10-29 NOTE — Progress Notes (Signed)
Denies allergies to eggs or soy products. Denies complication of anesthesia or sedation. Denies use of weight loss medication. Denies use of O2.   Emmi instructions declined.  

## 2017-11-11 ENCOUNTER — Other Ambulatory Visit: Payer: Self-pay

## 2017-11-11 ENCOUNTER — Ambulatory Visit (AMBULATORY_SURGERY_CENTER): Payer: Medicare Other | Admitting: Gastroenterology

## 2017-11-11 ENCOUNTER — Encounter: Payer: Self-pay | Admitting: Gastroenterology

## 2017-11-11 VITALS — BP 128/77 | HR 65 | Temp 99.3°F | Resp 11 | Ht 68.0 in | Wt 154.0 lb

## 2017-11-11 DIAGNOSIS — K3189 Other diseases of stomach and duodenum: Secondary | ICD-10-CM

## 2017-11-11 DIAGNOSIS — K208 Other esophagitis: Secondary | ICD-10-CM | POA: Diagnosis not present

## 2017-11-11 DIAGNOSIS — K219 Gastro-esophageal reflux disease without esophagitis: Secondary | ICD-10-CM

## 2017-11-11 DIAGNOSIS — R933 Abnormal findings on diagnostic imaging of other parts of digestive tract: Secondary | ICD-10-CM | POA: Diagnosis not present

## 2017-11-11 NOTE — Op Note (Signed)
[facility] Patient Name: Roger Hanson Procedure Date: 11/11/2017 10:25 AM MRN: [id number] Endoscopist: Ladene Artist , MD Age: 83 Referring MD:  Date of Birth: [birth year] Gender: Male Account #: [id number] Procedure:                Upper GI endoscopy Indications:              Gastroesophageal reflux disease, Abnormal UGI series Medicines:                Monitored Anesthesia Care Procedure:                Pre-Anesthesia Assessment:                           - Prior to the procedure, a History and Physical                            was performed, and patient medications and                            allergies were reviewed. The patient's tolerance of                            previous anesthesia was also reviewed. The risks                            and benefits of the procedure and the sedation                            options and risks were discussed with the patient.                            All questions were answered, and informed consent                            was obtained. Prior Anticoagulants: The patient has                            taken no previous anticoagulant or antiplatelet                            agents. ASA Grade Assessment: II - A patient with                            mild systemic disease. After reviewing the risks                            and benefits, the patient was deemed in                            satisfactory condition to undergo the procedure.                           After obtaining informed consent, the endoscope was  passed under direct vision. Throughout the                            procedure, the patient's blood pressure, pulse, and                            oxygen saturations were monitored continuously. The                            Endoscope was introduced through the mouth, and                            advanced to the second part of duodenum. The upper                            GI  endoscopy was accomplished without difficulty.                            The patient tolerated the procedure well. Scope In: Scope Out: Findings:                 The Z-line was variable and was found at the                            gastroesophageal junction. Biopsies were taken with                            a cold forceps for histology.                           The exam of the esophagus was otherwise normal.                           Localized prominent gastric folds were found in the                            cardia and in the gastric fundus. Biopsies were                            taken with a cold forceps for histology.                           Diffuse mildly erythematous mucosa without bleeding                            was found in the gastric fundus and in the gastric                            body.                           The exam of the stomach was otherwise normal.                           Localized  mild inflammation characterized by                            congestion (edema), erythema and granularity was                            found in the duodenal bulb and in the second                            portion of the duodenum. Complications:            No immediate complications. Estimated Blood Loss:     Estimated blood loss was minimal. Impression:               - Z-line variable, at the gastroesophageal                            junction. Biopsied.                           - Enlarged gastric folds. Biopsied.                           - Erythematous mucosa in the gastric fundus and                            gastric body.                           - Duodenitis. Recommendation:           - Patient has a contact number available for                            emergencies. The signs and symptoms of potential                            delayed complications were discussed with the                            patient. Return to normal activities tomorrow.                             Written discharge instructions were provided to the                            patient.                           - Resume previous diet.                           - Follow antireflux measures.                           - Continue present medications.                           -  Await pathology results. Ladene Artist, MD 11/11/2017 10:45:59 AM This report has been signed electronically.

## 2017-11-11 NOTE — Progress Notes (Signed)
To recovery, report to RN, VSS. 

## 2017-11-11 NOTE — Patient Instructions (Signed)
YOU HAD AN ENDOSCOPIC PROCEDURE TODAY: Refer to the procedure report and other information in the discharge instructions given to you for any specific questions about what was found during the examination. If this information does not answer your questions, please call [facility] office at [phone] to clarify.   YOU SHOULD EXPECT: Some feelings of bloating in the abdomen. Passage of more gas than usual. Walking can help get rid of the air that was put into your GI tract during the procedure and reduce the bloating. If you had a lower endoscopy (such as a colonoscopy or flexible sigmoidoscopy) you may notice spotting of blood in your stool or on the toilet paper. Some abdominal soreness may be present for a day or two, also.  DIET: Your first meal following the procedure should be a light meal and then it is ok to progress to your normal diet. A half-sandwich or bowl of soup is an example of a good first meal. Heavy or fried foods are harder to digest and may make you feel nauseous or bloated. Drink plenty of fluids but you should avoid alcoholic beverages for 24 hours. If you had a esophageal dilation, please see attached instructions for diet.    ACTIVITY: Your care partner should take you home directly after the procedure. You should plan to take it easy, moving slowly for the rest of the day. You can resume normal activity the day after the procedure however YOU SHOULD NOT DRIVE, use power tools, machinery or perform tasks that involve climbing or major physical exertion for 24 hours (because of the sedation medicines used during the test).   SYMPTOMS TO REPORT IMMEDIATELY: A gastroenterologist can be reached at any hour. Please call [phone]  for any of the following symptoms:   Following upper endoscopy (EGD, EUS, ERCP, esophageal dilation) Vomiting of blood or coffee ground material  New, significant abdominal pain  New, significant chest pain or pain under the shoulder blades  Painful or  persistently difficult swallowing  New shortness of breath  Black, tarry-looking or red, bloody stools  FOLLOW UP:  If any biopsies were taken you will be contacted by phone or by letter within the next 1-3 weeks. Call [phone]  if you have not heard about the biopsies in 3 weeks.  Please also call with any specific questions about appointments or follow up tests.  

## 2017-11-11 NOTE — Progress Notes (Signed)
Pt's states no medical or surgical changes since previsit or office visit. 

## 2017-11-12 ENCOUNTER — Telehealth: Payer: Self-pay

## 2017-11-12 NOTE — Telephone Encounter (Signed)
NO ANSWER, MESSAGE LEFT FOR PATIENT. 

## 2017-11-12 NOTE — Telephone Encounter (Signed)
  Follow up Call-  Call back number 11/11/2017  Post procedure Call Back phone  # [phone]  Permission to leave phone message Yes  Some recent data might be hidden     Patient questions:  Do you have a fever, pain , or abdominal swelling? No. Pain Score  0 *  Have you tolerated food without any problems? Yes.    Have you been able to return to your normal activities? Yes.    Do you have any questions about your discharge instructions: Diet   No. Medications  No. Follow up visit  No.  Do you have questions or concerns about your Care? No.  Actions: * If pain score is 4 or above: No action needed, pain <4.

## 2017-11-16 ENCOUNTER — Encounter: Payer: Self-pay | Admitting: Gastroenterology

## 2018-04-24 ENCOUNTER — Encounter: Payer: Self-pay | Admitting: Gastroenterology

## 2018-05-05 ENCOUNTER — Other Ambulatory Visit: Payer: Self-pay | Admitting: Family Medicine

## 2019-03-09 ENCOUNTER — Telehealth: Payer: Self-pay | Admitting: Emergency Medicine

## 2019-03-09 NOTE — Telephone Encounter (Signed)
Ok with me 

## 2019-03-09 NOTE — Telephone Encounter (Signed)
Please advise?    Reason for CRM: previous pt of Derinda Late was referred to Dr. Ronnald Ramp OR Dr. Jenny Reichmann as a np. Advised that neither providers are taking on pt. Pt/daughter asked that I still ask to be sure. Would either provider take on pt as a np?     CB: [name] MCGuier -

## 2019-03-09 NOTE — Telephone Encounter (Signed)
Spoke with pt's daughter to schedule appt.

## 2019-04-07 ENCOUNTER — Other Ambulatory Visit (INDEPENDENT_AMBULATORY_CARE_PROVIDER_SITE_OTHER): Payer: Medicare HMO

## 2019-04-07 ENCOUNTER — Ambulatory Visit (INDEPENDENT_AMBULATORY_CARE_PROVIDER_SITE_OTHER): Payer: Medicare HMO | Admitting: Internal Medicine

## 2019-04-07 ENCOUNTER — Other Ambulatory Visit: Payer: Self-pay

## 2019-04-07 ENCOUNTER — Encounter: Payer: Self-pay | Admitting: Internal Medicine

## 2019-04-07 VITALS — BP 126/84 | HR 87 | Temp 98.5°F | Ht 68.0 in | Wt 150.0 lb

## 2019-04-07 DIAGNOSIS — E538 Deficiency of other specified B group vitamins: Secondary | ICD-10-CM | POA: Diagnosis not present

## 2019-04-07 DIAGNOSIS — Z0000 Encounter for general adult medical examination without abnormal findings: Secondary | ICD-10-CM | POA: Diagnosis not present

## 2019-04-07 DIAGNOSIS — E559 Vitamin D deficiency, unspecified: Secondary | ICD-10-CM

## 2019-04-07 DIAGNOSIS — F03918 Unspecified dementia, unspecified severity, with other behavioral disturbance: Secondary | ICD-10-CM | POA: Insufficient documentation

## 2019-04-07 DIAGNOSIS — E611 Iron deficiency: Secondary | ICD-10-CM

## 2019-04-07 DIAGNOSIS — E119 Type 2 diabetes mellitus without complications: Secondary | ICD-10-CM | POA: Diagnosis not present

## 2019-04-07 DIAGNOSIS — Z23 Encounter for immunization: Secondary | ICD-10-CM | POA: Diagnosis not present

## 2019-04-07 DIAGNOSIS — Z515 Encounter for palliative care: Secondary | ICD-10-CM | POA: Insufficient documentation

## 2019-04-07 DIAGNOSIS — F0390 Unspecified dementia without behavioral disturbance: Secondary | ICD-10-CM | POA: Insufficient documentation

## 2019-04-07 DIAGNOSIS — I10 Essential (primary) hypertension: Secondary | ICD-10-CM | POA: Insufficient documentation

## 2019-04-07 LAB — BASIC METABOLIC PANEL
BUN: 12 mg/dL (ref 6–23)
CO2: 32 mEq/L (ref 19–32)
Calcium: 9.6 mg/dL (ref 8.4–10.5)
Chloride: 104 mEq/L (ref 96–112)
Creatinine, Ser: 1.04 mg/dL (ref 0.40–1.50)
GFR: 68.36 mL/min (ref >60.00)
Glucose, Bld: 100 mg/dL — ABNORMAL HIGH (ref 70–99)
Potassium: 4 mEq/L (ref 3.5–5.1)
Sodium: 143 mEq/L (ref 135–145)

## 2019-04-07 LAB — HEPATIC FUNCTION PANEL
ALT: 9 U/L (ref 0–53)
AST: 11 U/L (ref 0–37)
Albumin: 4.1 g/dL (ref 3.5–5.2)
Alkaline Phosphatase: 102 U/L (ref 39–117)
Bilirubin, Direct: 0.2 mg/dL (ref 0.0–0.3)
Total Bilirubin: 1.3 mg/dL — ABNORMAL HIGH (ref 0.2–1.2)
Total Protein: 6.3 g/dL (ref 6.0–8.3)

## 2019-04-07 LAB — LIPID PANEL
Cholesterol: 161 mg/dL (ref 0–200)
HDL: 48.3 mg/dL (ref >39.00)
LDL Cholesterol: 96 mg/dL (ref 0–99)
NonHDL: 112.87
Total CHOL/HDL Ratio: 3
Triglycerides: 82 mg/dL (ref 0.0–149.0)
VLDL: 16.4 mg/dL (ref 0.0–40.0)

## 2019-04-07 LAB — CBC WITH DIFFERENTIAL/PLATELET
Basophils Absolute: 0.1 10*3/uL (ref 0.0–0.1)
Basophils Relative: 1.3 % (ref 0.0–3.0)
Eosinophils Absolute: 0.2 10*3/uL (ref 0.0–0.7)
Eosinophils Relative: 4.3 % (ref 0.0–5.0)
HCT: 41.5 % (ref 39.0–52.0)
Hemoglobin: 13.7 g/dL (ref 13.0–17.0)
Lymphocytes Relative: 27.6 % (ref 12.0–46.0)
Lymphs Abs: 1.5 10*3/uL (ref 0.7–4.0)
MCHC: 32.9 g/dL (ref 30.0–36.0)
MCV: 92.9 fl (ref 78.0–100.0)
Monocytes Absolute: 0.5 10*3/uL (ref 0.1–1.0)
Monocytes Relative: 9.2 % (ref 3.0–12.0)
Neutro Abs: 3.1 10*3/uL (ref 1.4–7.7)
Neutrophils Relative %: 57.6 % (ref 43.0–77.0)
Platelets: 164 10*3/uL (ref 150.0–400.0)
RBC: 4.46 Mil/uL (ref 4.22–5.81)
RDW: 14 % (ref 11.5–15.5)
WBC: 5.3 10*3/uL (ref 4.0–10.5)

## 2019-04-07 LAB — TSH: TSH: 2.09 u[IU]/mL (ref 0.35–4.50)

## 2019-04-07 LAB — IBC PANEL
Iron: 66 ug/dL (ref 42–165)
Saturation Ratios: 18.4 % — ABNORMAL LOW (ref 20.0–50.0)
Transferrin: 256 mg/dL (ref 212.0–360.0)

## 2019-04-07 LAB — MICROALBUMIN / CREATININE URINE RATIO
Creatinine,U: 61.2 mg/dL
Microalb Creat Ratio: 1.1 mg/g (ref 0.0–30.0)
Microalb, Ur: <0.7 mg/dL (ref 0.0–1.9)

## 2019-04-07 LAB — HEMOGLOBIN A1C: Hgb A1c MFr Bld: 6.9 % — ABNORMAL HIGH (ref 4.6–6.5)

## 2019-04-07 LAB — PSA: PSA: 0 ng/mL — ABNORMAL LOW (ref 0.10–4.00)

## 2019-04-07 MED ORDER — ZOSTER VAC RECOMB ADJUVANTED 50 MCG/0.5ML IM SUSR: 0.5000 mL | Freq: Once | INTRAMUSCULAR | 1 refills | Status: AC

## 2019-04-07 NOTE — Progress Notes (Signed)
Subjective:    Patient ID: Roger Hanson, male    DOB: 04/08/1935, 84 y.o.   MRN: [id number]  HPI  Here for wellness and f/u;  Overall doing ok;  Pt denies Chest pain, worsening SOB, DOE, wheezing, orthopnea, PND, worsening LE edema, palpitations, dizziness or syncope. Pt denies polydipsia, polyuria, or low sugar symptoms. Pt states overall good compliance with treatment and medications, good tolerability, and has been trying to follow appropriate diet.  Pt denies worsening depressive symptoms, suicidal ideation or panic. No fever, night sweats, wt loss, loss of appetite, or other constitutional symptoms.  Pt states good ability with ADL's, has low fall risk, home safety reviewed and adequate, no other significant changes in hearing or vision, and only occasionally active with exercise.  Lives with son with him today.  Was sedated with the gabapentin and nodded off and fell with abrasion to mid forehead only.  Pt denies new neurological symptoms such as new headache, or facial or extremity weakness or numbness    No other new complaints Past Medical History:  Diagnosis Date  . Anxiety   . DDD (degenerative disc disease), lumbosacral   . Diabetes mellitus without complication ([place])   . Diverticulosis   . GERD (gastroesophageal reflux disease)   . Hearing loss   . Hypercholesteremia   . Hypertension   . Hypothyroidism   . Insomnia   . Lumbar radiculopathy   . Prostate cancer ([place])   . Psoriasis   . PUD (peptic ulcer disease)   . Pulmonary nodules   . Restless legs syndrome (RLS)   . Vitamin D deficiency disease    Past Surgical History:  Procedure Laterality Date  . ANKLE SURGERY  2005  . ARTHROSCOPY KNEE W/ DRILLING Right 2007  . CARPAL TUNNEL RELEASE Bilateral 1999  . HEMORROIDECTOMY  1980  . LUMBAR FUSION  2013  . PROSTATECTOMY  2005  . SQUAMOUS CELL CARCINOMA EXCISION  2008   from scrotum    reports that he has never smoked. He has never used smokeless tobacco. He reports  that he does not drink alcohol or use drugs. family history includes Breast cancer in his mother; CVA in his mother; Diabetes in his brother and father; Hypertension in his brother. Allergies  Allergen Reactions  . Glipizide Other (See Comments)    Erythema multiforme  . Lovastatin     Erythema multiforme  . Omeprazole Nausea And Vomiting  . Protonix [Pantoprazole Sodium] Nausea Only   Current Outpatient Medications on File Prior to Visit  Medication Sig Dispense Refill  . ALPRAZolam (XANAX) 0.5 MG tablet Take 0.5 mg by mouth at bedtime as needed for anxiety.    Marland Kitchen amLODipine (NORVASC) 10 MG tablet Take 1 tablet (10 mg total) by mouth daily. (Patient taking differently: Take 5 mg by mouth daily. ) 30 tablet 0  . aspirin 81 MG tablet Take 81 mg by mouth daily.    . cholecalciferol (VITAMIN D) 1000 UNITS tablet Take 2,000 Units by mouth daily.    Marland Kitchen ESOMEPRAZOLE MAGNESIUM PO Take 22.3 mg by mouth every morning.     Marland Kitchen glimepiride (AMARYL) 4 MG tablet Take 4 mg by mouth 2 (two) times daily.     . Levothyroxine Sodium 100 MCG CAPS Take 100 mcg by mouth daily before breakfast.     . ondansetron (ZOFRAN) 4 MG tablet Take 4 mg by mouth 2 (two) times daily as needed for nausea or vomiting.    . pravastatin (PRAVACHOL) 80 MG tablet  Take 80 mg by mouth every evening.     . TRADJENTA 5 MG TABS tablet Take 5 mg by mouth every morning.  11   No current facility-administered medications on file prior to visit.    Review of Systems Constitutional: Negative for other unusual diaphoresis, sweats, appetite or weight changes HENT: Negative for other worsening hearing loss, ear pain, facial swelling, mouth sores or neck stiffness.   Eyes: Negative for other worsening pain, redness or other visual disturbance.  Respiratory: Negative for other stridor or swelling Cardiovascular: Negative for other palpitations or other chest pain  Gastrointestinal: Negative for worsening diarrhea or loose stools, blood in  stool, distention or other pain Genitourinary: Negative for hematuria, flank pain or other change in urine volume.  Musculoskeletal: Negative for myalgias or other joint swelling.  Skin: Negative for other color change, or other wound or worsening drainage.  Neurological: Negative for other syncope or numbness. Hematological: Negative for other adenopathy or swelling Psychiatric/Behavioral: Negative for hallucinations, other worsening agitation, SI, self-injury, or new decreased concentration All other system neg per pt    Objective:   Physical Exam BP 126/84   Pulse 87   Temp 98.5 F (36.9 C) (Oral)   Ht 5\' 8"  (1.727 m)   Wt 150 lb (68 kg)   SpO2 98%   BMI 22.81 kg/m  VS noted,  Constitutional: Pt is oriented to person, place, and time. Appears well-developed and well-nourished, in no significant distress and comfortable Head: Normocephalic and atraumatic  Eyes: Conjunctivae and EOM are normal. Pupils are equal, round, and reactive to light Right Ear: External ear normal without discharge Left Ear: External ear normal without discharge Nose: Nose without discharge or deformity Mouth/Throat: Oropharynx is without other ulcerations and moist  Neck: Normal range of motion. Neck supple. No JVD present. No tracheal deviation present or significant neck LA or mass Cardiovascular: Normal rate, regular rhythm, normal heart sounds and intact distal pulses.   Pulmonary/Chest: WOB normal and breath sounds without rales or wheezing  Abdominal: Soft. Bowel sounds are normal. NT. No HSM  Musculoskeletal: Normal range of motion. Exhibits no edema Lymphadenopathy: Has no other cervical adenopathy.  Neurological: Pt is alert and oriented to person, place, and time. Pt has normal reflexes. No cranial nerve deficit. Motor grossly intact, Gait intact Skin: Skin is warm and dry. No rash noted or new ulcerations Psychiatric:  Has normal mood and affect. Behavior is normal without agitation No other  exam findings Lab Results  Component Value Date   WBC 5.3 04/07/2019   HGB 13.7 04/07/2019   HCT 41.5 04/07/2019   PLT 164.0 04/07/2019   GLUCOSE 100 (H) 04/07/2019   CHOL 161 04/07/2019   TRIG 82.0 04/07/2019   HDL 48.30 04/07/2019   LDLCALC 96 04/07/2019   ALT 9 04/07/2019   AST 11 04/07/2019   NA 143 04/07/2019   K 4.0 04/07/2019   CL 104 04/07/2019   CREATININE 1.04 04/07/2019   BUN 12 04/07/2019   CO2 32 04/07/2019   TSH 2.09 04/07/2019   PSA 0.00 Repeated and verified X2. (L) 04/07/2019   INR 1.0 02/26/2007   HGBA1C 6.9 (H) 04/07/2019   MICROALBUR <0.7 04/07/2019      Assessment & Plan:

## 2019-04-07 NOTE — Assessment & Plan Note (Signed)
Off losartan due to low BP

## 2019-04-07 NOTE — Patient Instructions (Addendum)
Please remember to call for your eye doctor for your yearly appt  [facility]  You had the Prevnar 13 pneumonia shot, and the Tdap tetanus shot today  Please continue all other medications as before, and refills have been done if requested.  Please have the pharmacy call with any other refills you may need.  Please continue your efforts at being more active, low cholesterol diet, and weight control.  You are otherwise up to date with prevention measures today.  Please keep your appointments with your specialists as you may have planned  Please go to the LAB in the Basement (turn left off the elevator) for the tests to be done today  You will be contacted by phone if any changes need to be made immediately.  Otherwise, you will receive a letter about your results with an explanation, but please check with MyChart first.  Please remember to sign up for MyChart if you have not done so, as this will be important to you in the future with finding out test results, communicating by private email, and scheduling acute appointments online when needed.  Please return in 6 months, or sooner if needed, with Lab testing done 3-5 days before

## 2019-04-08 ENCOUNTER — Telehealth: Payer: Self-pay

## 2019-04-08 ENCOUNTER — Encounter: Payer: Self-pay | Admitting: Internal Medicine

## 2019-04-08 LAB — URINALYSIS, ROUTINE W REFLEX MICROSCOPIC
Bilirubin Urine: NEGATIVE
Ketones, ur: NEGATIVE
Leukocytes,Ua: NEGATIVE
Nitrite: NEGATIVE
Specific Gravity, Urine: 1.01 (ref 1.000–1.030)
Total Protein, Urine: NEGATIVE
Urine Glucose: 250 — AB
Urobilinogen, UA: 0.2 (ref 0.0–1.0)
WBC, UA: NONE SEEN (ref 0–?)
pH: 6 (ref 5.0–8.0)

## 2019-04-08 LAB — VITAMIN D 25 HYDROXY (VIT D DEFICIENCY, FRACTURES): VITD: 51.36 ng/mL (ref 30.00–100.00)

## 2019-04-08 LAB — VITAMIN B12: Vitamin B-12: 114 pg/mL — ABNORMAL LOW (ref 211–911)

## 2019-04-08 NOTE — Telephone Encounter (Signed)
Pt has been informed of results and expressed understanding.  °

## 2019-04-08 NOTE — Telephone Encounter (Signed)
-----   Message from Biagio Borg, MD sent at 04/08/2019 12:23 PM EDT ----- Left message on MyChart, pt to cont same tx except  The test results show that your current treatment is OK, except the Vitamin B12 level is low.  I will ask the office to call you to start monthly B12 shots until the next visit, when we may be able to change over to pills.  Shirron to please inform pt, and help start B12 shots

## 2019-04-09 ENCOUNTER — Ambulatory Visit (INDEPENDENT_AMBULATORY_CARE_PROVIDER_SITE_OTHER): Payer: Medicare HMO

## 2019-04-09 ENCOUNTER — Encounter: Payer: Self-pay | Admitting: Internal Medicine

## 2019-04-09 DIAGNOSIS — E538 Deficiency of other specified B group vitamins: Secondary | ICD-10-CM | POA: Diagnosis not present

## 2019-04-09 MED ORDER — CYANOCOBALAMIN 1000 MCG/ML IJ SOLN
1000.0000 ug | Freq: Once | INTRAMUSCULAR | Status: AC
  Administered 2019-04-09: 1000 ug via INTRAMUSCULAR

## 2019-04-09 NOTE — Progress Notes (Addendum)
b12 injection given  Medical screening examination/treatment/procedure(s) were performed by non-physician practitioner and as supervising physician I was immediately available for consultation/collaboration. I agree with above. Cathlean Cower, MD

## 2019-04-09 NOTE — Assessment & Plan Note (Signed)

## 2019-04-09 NOTE — Assessment & Plan Note (Signed)
stable overall by history and exam, recent data reviewed with pt, and pt to continue medical treatment as before,  to f/u any worsening symptoms or concerns  

## 2019-04-10 ENCOUNTER — Telehealth: Payer: Self-pay | Admitting: Internal Medicine

## 2019-04-10 MED ORDER — PRAVASTATIN SODIUM 80 MG PO TABS: 80.0000 mg | ORAL_TABLET | Freq: Every evening | ORAL | 1 refills | Status: DC

## 2019-04-10 NOTE — Telephone Encounter (Signed)
Medication Refill - Medication: pravastatin (PRAVACHOL) 80 MG tablet, ALPRAZolam (XANAX) 0.5 MG tablet  Preferred Pharmacy:   [facility] [place], [place] [address] [phone] (Phone) [phone] (Fax)     Pt's son was advised that RX refills may take up to 3 business days. We ask that you follow-up with your pharmacy.

## 2019-04-14 ENCOUNTER — Telehealth: Payer: Self-pay | Admitting: Internal Medicine

## 2019-04-14 NOTE — Telephone Encounter (Signed)
Medication Refill - Medication: ALPRAZolam (XANAX) 0.5 MG tablet (Pharmacy stated they didn't receive medication request)  Has the patient contacted their pharmacy?Yes (Agent: If no, request that the patient contact the pharmacy for the refill.) (Agent: If yes, when and what did the pharmacy advise?)Contact PCP  Preferred Pharmacy (with phone number or street name):  [facility] [place], [place] [address] [phone] (Phone) [phone] (Fax)     Agent: Please be advised that RX refills may take up to 3 business days. We ask that you follow-up with your pharmacy.

## 2019-04-15 MED ORDER — ALPRAZOLAM 0.5 MG PO TABS: 0.5000 mg | ORAL_TABLET | Freq: Every evening | ORAL | 5 refills | Status: DC | PRN

## 2019-04-15 NOTE — Addendum Note (Signed)
Addended by: Biagio Borg on: 04/15/2019 12:51 PM   Modules accepted: Orders

## 2019-04-15 NOTE — Telephone Encounter (Signed)
Done erx 

## 2019-04-17 ENCOUNTER — Encounter: Payer: Self-pay | Admitting: Internal Medicine

## 2019-04-17 DIAGNOSIS — M519 Unspecified thoracic, thoracolumbar and lumbosacral intervertebral disc disorder: Secondary | ICD-10-CM

## 2019-04-17 DIAGNOSIS — M179 Osteoarthritis of knee, unspecified: Secondary | ICD-10-CM

## 2019-04-17 DIAGNOSIS — F419 Anxiety disorder, unspecified: Secondary | ICD-10-CM | POA: Insufficient documentation

## 2019-04-17 DIAGNOSIS — F5104 Psychophysiologic insomnia: Secondary | ICD-10-CM

## 2019-04-17 DIAGNOSIS — K2950 Unspecified chronic gastritis without bleeding: Secondary | ICD-10-CM

## 2019-04-17 DIAGNOSIS — E559 Vitamin D deficiency, unspecified: Secondary | ICD-10-CM

## 2019-04-17 DIAGNOSIS — G2581 Restless legs syndrome: Secondary | ICD-10-CM

## 2019-04-17 DIAGNOSIS — G629 Polyneuropathy, unspecified: Secondary | ICD-10-CM

## 2019-04-17 DIAGNOSIS — N183 Chronic kidney disease, stage 3 unspecified: Secondary | ICD-10-CM

## 2019-04-17 DIAGNOSIS — M1710 Unilateral primary osteoarthritis, unspecified knee: Secondary | ICD-10-CM

## 2019-04-17 DIAGNOSIS — K279 Peptic ulcer, site unspecified, unspecified as acute or chronic, without hemorrhage or perforation: Secondary | ICD-10-CM | POA: Insufficient documentation

## 2019-04-17 DIAGNOSIS — K219 Gastro-esophageal reflux disease without esophagitis: Secondary | ICD-10-CM | POA: Insufficient documentation

## 2019-04-17 DIAGNOSIS — L409 Psoriasis, unspecified: Secondary | ICD-10-CM | POA: Insufficient documentation

## 2019-04-17 DIAGNOSIS — E039 Hypothyroidism, unspecified: Secondary | ICD-10-CM | POA: Insufficient documentation

## 2019-04-17 DIAGNOSIS — E785 Hyperlipidemia, unspecified: Secondary | ICD-10-CM

## 2019-04-17 DIAGNOSIS — D509 Iron deficiency anemia, unspecified: Secondary | ICD-10-CM

## 2019-04-17 HISTORY — DX: Unilateral primary osteoarthritis, unspecified knee: M17.10

## 2019-04-17 HISTORY — DX: Hyperlipidemia, unspecified: E78.5

## 2019-04-17 HISTORY — DX: Unspecified chronic gastritis without bleeding: K29.50

## 2019-04-17 HISTORY — DX: Osteoarthritis of knee, unspecified: M17.9

## 2019-04-17 HISTORY — DX: Vitamin D deficiency, unspecified: E55.9

## 2019-04-17 HISTORY — DX: Chronic kidney disease, stage 3 unspecified: N18.30

## 2019-04-17 HISTORY — DX: Restless legs syndrome: G25.81

## 2019-04-17 HISTORY — DX: Polyneuropathy, unspecified: G62.9

## 2019-04-17 HISTORY — DX: Psychophysiologic insomnia: F51.04

## 2019-04-17 HISTORY — DX: Unspecified thoracic, thoracolumbar and lumbosacral intervertebral disc disorder: M51.9

## 2019-04-17 HISTORY — DX: Iron deficiency anemia, unspecified: D50.9

## 2019-04-20 ENCOUNTER — Ambulatory Visit (INDEPENDENT_AMBULATORY_CARE_PROVIDER_SITE_OTHER)
Admission: RE | Admit: 2019-04-20 | Discharge: 2019-04-20 | Disposition: A | Discharge status: Home / Self Care | Payer: Medicare HMO | Source: Ambulatory Visit | Attending: Internal Medicine | Admitting: Internal Medicine

## 2019-04-20 ENCOUNTER — Telehealth: Payer: Self-pay | Admitting: Internal Medicine

## 2019-04-20 ENCOUNTER — Encounter: Payer: Self-pay | Admitting: Internal Medicine

## 2019-04-20 ENCOUNTER — Ambulatory Visit (INDEPENDENT_AMBULATORY_CARE_PROVIDER_SITE_OTHER): Payer: Medicare HMO | Admitting: Internal Medicine

## 2019-04-20 ENCOUNTER — Other Ambulatory Visit: Payer: Self-pay

## 2019-04-20 VITALS — BP 130/68 | HR 87 | Temp 98.7°F | Resp 14 | Ht 68.0 in | Wt 147.0 lb

## 2019-04-20 DIAGNOSIS — S4992XA Unspecified injury of left shoulder and upper arm, initial encounter: Secondary | ICD-10-CM | POA: Diagnosis not present

## 2019-04-20 DIAGNOSIS — M25512 Pain in left shoulder: Secondary | ICD-10-CM

## 2019-04-20 DIAGNOSIS — F0390 Unspecified dementia without behavioral disturbance: Secondary | ICD-10-CM

## 2019-04-20 DIAGNOSIS — F419 Anxiety disorder, unspecified: Secondary | ICD-10-CM

## 2019-04-20 DIAGNOSIS — M7989 Other specified soft tissue disorders: Secondary | ICD-10-CM | POA: Diagnosis not present

## 2019-04-20 NOTE — Assessment & Plan Note (Signed)
Suspect rotater cuff dz, for film r/o fx, tylenol prn, and refer sports medicine

## 2019-04-20 NOTE — Telephone Encounter (Signed)
Son called [facility] states patient fell on his shoulder and is sagging on his left arm.  Reports left arm is not as supported.  Can raise it up 50 to 60%.  Says it doesn't hurt much.   Looks like patient is scheduled for today 04/20/19 with Dr. Jenny Reichmann.

## 2019-04-20 NOTE — Assessment & Plan Note (Signed)
stable overall by history and exam, recent data reviewed with pt, and pt to continue medical treatment as before,  to f/u any worsening symptoms or concerns  

## 2019-04-20 NOTE — Progress Notes (Signed)
Subjective:    Patient ID: Roger Hanson, male    DOB: 1935-07-19, 84 y.o.   MRN: [id number]  HPI  Here with daughter in law, had a fall in the yard in the dark about 0100 on Sunday morning (yesterday); pt states simply got off balance with going down a slope in the yard to the lawnmower still there, and fell to the left shoulder.  Had some initial pain now better and since then family noted some swelling to the left shoulder compared to the right ad now unable to raise the arm overhead.  Pt denies chest pain, increased sob or doe, wheezing, orthopnea, PND, increased LE swelling, palpitations, dizziness or syncope.   Pt denies polydipsia, polyuria, Pt denies new neurological symptoms such as new headache, or facial or extremity weakness or numbness.  Dementia overall stable symptomaticall and not assoc with behavioral changes such as hallucinations, paranoia, or agitation. Denies worsening depressive symptoms, suicidal ideation, or panic Past Medical History:  Diagnosis Date  . Anxiety   . Arthritis of knee, degenerative 04/17/2019  . Chronic gastritis 04/17/2019  . Chronic insomnia 04/17/2019  . CKD (chronic kidney disease) stage 3, GFR 30-59 ml/min ([facility]) 04/17/2019  . DDD (degenerative disc disease), lumbosacral   . Diabetes mellitus without complication ([place])   . Diverticulosis   . GERD (gastroesophageal reflux disease)   . Hearing loss   . HLD (hyperlipidemia) 04/17/2019  . Hypercholesteremia   . Hypertension   . Hypothyroidism   . Insomnia   . Iron deficiency anemia 04/17/2019  . Lumbar disc disease 04/17/2019  . Lumbar radiculopathy   . Peripheral neuropathy 04/17/2019  . Prostate cancer ([place])   . Psoriasis   . PUD (peptic ulcer disease)   . Pulmonary nodules   . Restless legs syndrome (RLS)   . RLS (restless legs syndrome) 04/17/2019  . Vitamin D deficiency 04/17/2019  . Vitamin D deficiency disease    Past Surgical History:  Procedure Laterality Date  . ANKLE SURGERY  2005  .  ARTHROSCOPY KNEE W/ DRILLING Right 2007  . CARPAL TUNNEL RELEASE Bilateral 1999  . HEMORROIDECTOMY  1980  . LUMBAR FUSION  2013  . PROSTATECTOMY  2005  . SQUAMOUS CELL CARCINOMA EXCISION  2008   from scrotum    reports that he has never smoked. He has never used smokeless tobacco. He reports that he does not drink alcohol or use drugs. family history includes Breast cancer in his mother; CVA in his mother; Diabetes in his brother and father; Hypertension in his brother. Allergies  Allergen Reactions  . Glipizide Other (See Comments)    Erythema multiforme  . Lovastatin     Erythema multiforme  . Omeprazole Nausea And Vomiting  . Protonix [Pantoprazole Sodium] Nausea Only   Current Outpatient Medications on File Prior to Visit  Medication Sig Dispense Refill  . ALPRAZolam (XANAX) 0.5 MG tablet Take 1 tablet (0.5 mg total) by mouth at bedtime as needed for anxiety. (Patient taking differently: Take 0.25 mg by mouth at bedtime as needed for anxiety. ) 30 tablet 5  . amLODipine (NORVASC) 10 MG tablet Take 1 tablet (10 mg total) by mouth daily. (Patient taking differently: Take 5 mg by mouth daily. ) 30 tablet 0  . aspirin 81 MG tablet Take 81 mg by mouth daily.    . cholecalciferol (VITAMIN D) 1000 UNITS tablet Take 2,000 Units by mouth daily.    Marland Kitchen ESOMEPRAZOLE MAGNESIUM PO Take 22.3 mg by mouth every morning.     Marland Kitchen  glimepiride (AMARYL) 4 MG tablet Take 4 mg by mouth 2 (two) times daily.     . Levothyroxine Sodium 100 MCG CAPS Take 100 mcg by mouth daily before breakfast.     . ondansetron (ZOFRAN) 4 MG tablet Take 4 mg by mouth 2 (two) times daily as needed for nausea or vomiting.    . pravastatin (PRAVACHOL) 80 MG tablet Take 1 tablet (80 mg total) by mouth every evening. 90 tablet 1  . TRADJENTA 5 MG TABS tablet Take 5 mg by mouth every morning.  11   No current facility-administered medications on file prior to visit.    Review of Systems  Constitutional: Negative for other unusual  diaphoresis or sweats HENT: Negative for ear discharge or swelling Eyes: Negative for other worsening visual disturbances Respiratory: Negative for stridor or other swelling  Gastrointestinal: Negative for worsening distension or other blood Genitourinary: Negative for retention or other urinary change Musculoskeletal: Negative for other MSK pain or swelling Skin: Negative for color change or other new lesions Neurological: Negative for worsening tremors and other numbness  Psychiatric/Behavioral: Negative for worsening agitation or other fatigue All other system neg per pt    Objective:   Physical Exam BP 130/68   Pulse 87   Temp 98.7 F (37.1 C) (Oral)   Resp 14   Ht 5\' 8"  (1.727 m)   Wt 147 lb (66.7 kg)   SpO2 96%   BMI 22.35 kg/m  VS noted,  Constitutional: Pt appears in NAD HENT: Head: NCAT.  Right Ear: External ear normal.  Left Ear: External ear normal.  Eyes: . Pupils are equal, round, and reactive to light. Conjunctivae and EOM are normal Nose: without d/c or deformity Neck: Neck supple. Gross normal ROM Cardiovascular: Normal rate and regular rhythm.   Pulmonary/Chest: Effort normal and breath sounds without rales or wheezing.  Left shoulder with diffuse swelling, mild anterior tenderness and unable to abduct or forward elevated more then 90 degrees Neurological: Pt is alert. At baseline orientation, motor grossly intact Skin: Skin is warm. No rashes, other new lesions, no LE edema Psychiatric: Pt behavior is normal without agitation  No other exam findings Lab Results  Component Value Date   WBC 5.3 04/07/2019   HGB 13.7 04/07/2019   HCT 41.5 04/07/2019   PLT 164.0 04/07/2019   GLUCOSE 100 (H) 04/07/2019   CHOL 161 04/07/2019   TRIG 82.0 04/07/2019   HDL 48.30 04/07/2019   LDLCALC 96 04/07/2019   ALT 9 04/07/2019   AST 11 04/07/2019   NA 143 04/07/2019   K 4.0 04/07/2019   CL 104 04/07/2019   CREATININE 1.04 04/07/2019   BUN 12 04/07/2019   CO2 32  04/07/2019   TSH 2.09 04/07/2019   PSA 0.00 Repeated and verified X2. (L) 04/07/2019   INR 1.0 02/26/2007   HGBA1C 6.9 (H) 04/07/2019   MICROALBUR <0.7 04/07/2019        Assessment & Plan:

## 2019-04-20 NOTE — Telephone Encounter (Signed)
Noted  

## 2019-04-20 NOTE — Patient Instructions (Signed)
Please continue all other medications as before, and refills have been done if requested.  Please have the pharmacy call with any other refills you may need.  Please continue your efforts at being more active, low cholesterol diet, and weight control  Please keep your appointments with your specialists as you may have planned  Please go to the XRAY Department in the Basement (go straight as you get off the elevator) for the x-ray testing  You will be contacted by phone if any changes need to be made immediately.  Otherwise, you will receive a letter about your results with an explanation, but please check with MyChart first.  Please remember to sign up for MyChart if you have not done so, as this will be important to you in the future with finding out test results, communicating by private email, and scheduling acute appointments online when needed.  Please see Dr Tamala Julian for left shoulder pain asap

## 2019-04-23 DIAGNOSIS — Z6822 Body mass index (BMI) 22.0-22.9, adult: Secondary | ICD-10-CM | POA: Diagnosis not present

## 2019-04-23 DIAGNOSIS — F419 Anxiety disorder, unspecified: Secondary | ICD-10-CM | POA: Diagnosis not present

## 2019-04-23 DIAGNOSIS — K219 Gastro-esophageal reflux disease without esophagitis: Secondary | ICD-10-CM | POA: Diagnosis not present

## 2019-04-23 DIAGNOSIS — E1142 Type 2 diabetes mellitus with diabetic polyneuropathy: Secondary | ICD-10-CM | POA: Diagnosis not present

## 2019-04-23 DIAGNOSIS — Z7984 Long term (current) use of oral hypoglycemic drugs: Secondary | ICD-10-CM | POA: Diagnosis not present

## 2019-04-23 DIAGNOSIS — E039 Hypothyroidism, unspecified: Secondary | ICD-10-CM | POA: Diagnosis not present

## 2019-04-23 DIAGNOSIS — E785 Hyperlipidemia, unspecified: Secondary | ICD-10-CM | POA: Diagnosis not present

## 2019-05-06 ENCOUNTER — Encounter: Payer: Self-pay | Admitting: Family Medicine

## 2019-05-06 ENCOUNTER — Other Ambulatory Visit: Payer: Self-pay

## 2019-05-06 ENCOUNTER — Ambulatory Visit: Payer: Self-pay

## 2019-05-06 ENCOUNTER — Ambulatory Visit (INDEPENDENT_AMBULATORY_CARE_PROVIDER_SITE_OTHER): Payer: Medicare HMO | Admitting: Family Medicine

## 2019-05-06 VITALS — BP 140/82 | HR 70 | Ht 68.0 in | Wt 150.0 lb

## 2019-05-06 DIAGNOSIS — G8929 Other chronic pain: Secondary | ICD-10-CM

## 2019-05-06 DIAGNOSIS — S46012A Strain of muscle(s) and tendon(s) of the rotator cuff of left shoulder, initial encounter: Secondary | ICD-10-CM | POA: Diagnosis not present

## 2019-05-06 DIAGNOSIS — E538 Deficiency of other specified B group vitamins: Secondary | ICD-10-CM

## 2019-05-06 DIAGNOSIS — M75102 Unspecified rotator cuff tear or rupture of left shoulder, not specified as traumatic: Secondary | ICD-10-CM | POA: Insufficient documentation

## 2019-05-06 DIAGNOSIS — M25512 Pain in left shoulder: Secondary | ICD-10-CM

## 2019-05-06 MED ORDER — CYANOCOBALAMIN 1000 MCG/ML IJ SOLN
1000.0000 ug | Freq: Once | INTRAMUSCULAR | Status: AC
  Administered 2019-05-06: 1000 ug via INTRAMUSCULAR

## 2019-05-06 NOTE — Progress Notes (Signed)
[facility] [address] [place], [place] [address] Phone: [phone] Subjective:   I Roger Hanson am serving as a Education administrator for Dr. Hulan Saas.  I'm seeing this patient by the request  of:    CC: Left shoulder pain  [id number]  Roger Hanson is a 84 y.o. male coming in with complaint of left shoulder pain. Fell on his shoulder and seen Dr. Jenny Reichmann on 8/3. Has had xrays. Dr. Jenny Reichmann is concerned that it is a rotator cuff tear. States he has always had issues with the muscles in his shoulder. Today his shoulders are sore bilaterally. Pain has gotten better. ROM is limited. [place]. No numbness and tingling.   Onset- Chronic  Location - joint  Duration-  Character- soreness  Aggravating factors- flexion  Reliving factors-  Therapies tried- Tylenol 500mg  Severity-7 out of 10   Patient did have a left shoulder x-ray done on April 20, 2019.  Independently visualized by me.  Does show a high riding glenohumeral with narrowing of the subacromial space that is consistent with a rotator cuff tear  Past Medical History:  Diagnosis Date  . Anxiety   . Arthritis of knee, degenerative 04/17/2019  . Chronic gastritis 04/17/2019  . Chronic insomnia 04/17/2019  . CKD (chronic kidney disease) stage 3, GFR 30-59 ml/min (HCC) 04/17/2019  . DDD (degenerative disc disease), lumbosacral   . Diabetes mellitus without complication ([place])   . Diverticulosis   . GERD (gastroesophageal reflux disease)   . Hearing loss   . HLD (hyperlipidemia) 04/17/2019  . Hypercholesteremia   . Hypertension   . Hypothyroidism   . Insomnia   . Iron deficiency anemia 04/17/2019  . Lumbar disc disease 04/17/2019  . Lumbar radiculopathy   . Peripheral neuropathy 04/17/2019  . Prostate cancer ([place])   . Psoriasis   . PUD (peptic ulcer disease)   . Pulmonary nodules   . Restless legs syndrome (RLS)   . RLS (restless legs syndrome) 04/17/2019  . Vitamin D deficiency 04/17/2019  . Vitamin D  deficiency disease    Past Surgical History:  Procedure Laterality Date  . ANKLE SURGERY  2005  . ARTHROSCOPY KNEE W/ DRILLING Right 2007  . CARPAL TUNNEL RELEASE Bilateral 1999  . HEMORROIDECTOMY  1980  . LUMBAR FUSION  2013  . PROSTATECTOMY  2005  . SQUAMOUS CELL CARCINOMA EXCISION  2008   from scrotum   Social History   Socioeconomic History  . Marital status: Widowed    Spouse name: Not on file  . Number of children: Not on file  . Years of education: Not on file  . Highest education level: Not on file  Occupational History  . Not on file  Social Needs  . Financial resource strain: Not on file  . Food insecurity    Worry: Not on file    Inability: Not on file  . Transportation needs    Medical: Not on file    Non-medical: Not on file  Tobacco Use  . Smoking status: Never Smoker  . Smokeless tobacco: Never Used  Substance and Sexual Activity  . Alcohol use: No  . Drug use: No  . Sexual activity: Yes    Partners: Female  Lifestyle  . Physical activity    Days per week: Not on file    Minutes per session: Not on file  . Stress: Not on file  Relationships  . Social connections    Talks on phone: Not on file  Gets together: Not on file    Attends religious service: Not on file    Active member of club or organization: Not on file    Attends meetings of clubs or organizations: Not on file    Relationship status: Not on file  Other Topics Concern  . Not on file  Social History Narrative  . Not on file   Allergies  Allergen Reactions  . Glipizide Other (See Comments)    Erythema multiforme  . Lovastatin     Erythema multiforme  . Omeprazole Nausea And Vomiting  . Protonix [Pantoprazole Sodium] Nausea Only   Family History  Problem Relation Age of Onset  . CVA Mother   . Breast cancer Mother   . Diabetes Father   . Diabetes Brother   . Hypertension Brother   . Colon cancer Neg Hx   . Esophageal cancer Neg Hx   . Liver cancer Neg Hx   . Stomach  cancer Neg Hx   . Rectal cancer Neg Hx   . Pancreatic cancer Neg Hx     Current Outpatient Medications (Endocrine & Metabolic):  .  glimepiride (AMARYL) 4 MG tablet, Take 4 mg by mouth 2 (two) times daily.  .  Levothyroxine Sodium 100 MCG CAPS, Take 100 mcg by mouth daily before breakfast.  .  TRADJENTA 5 MG TABS tablet, Take 5 mg by mouth every morning.  Current Outpatient Medications (Cardiovascular):  .  amLODipine (NORVASC) 10 MG tablet, Take 1 tablet (10 mg total) by mouth daily. (Patient taking differently: Take 5 mg by mouth daily. ) .  pravastatin (PRAVACHOL) 80 MG tablet, Take 1 tablet (80 mg total) by mouth every evening.   Current Outpatient Medications (Analgesics):  .  aspirin 81 MG tablet, Take 81 mg by mouth daily.   Current Outpatient Medications (Other):  Marland Kitchen  ALPRAZolam (XANAX) 0.5 MG tablet, Take 1 tablet (0.5 mg total) by mouth at bedtime as needed for anxiety. (Patient taking differently: Take 0.25 mg by mouth at bedtime as needed for anxiety. ) .  cholecalciferol (VITAMIN D) 1000 UNITS tablet, Take 2,000 Units by mouth daily. Marland Kitchen  ESOMEPRAZOLE MAGNESIUM PO, Take 22.3 mg by mouth every morning.  .  ondansetron (ZOFRAN) 4 MG tablet, Take 4 mg by mouth 2 (two) times daily as needed for nausea or vomiting.    Past medical history, social, surgical and family history all reviewed in electronic medical record.  No pertanent information unless stated regarding to the chief complaint.   Review of Systems:  No headache, visual changes, nausea, vomiting, diarrhea, constipation, dizziness, abdominal pain, skin rash, fevers, chills, night sweats, weight loss, swollen lymph nodes, body aches, joint swelling, chest pain, shortness of breath, mood changes.  Positive muscle aches  Objective  Blood pressure 140/82, pulse 70, height 5\' 8"  (1.727 m), weight 150 lb (68 kg), SpO2 98 %.    General: No apparent distress alert not oriented but pleasant  HEENT: Pupils equal, extraocular  movements intact  Respiratory: Patient's speak in full sentences and does not appear short of breath  Cardiovascular: Trace lower extremity edema, non tender, no erythema  Skin: Warm dry intact with no signs of infection or rash on extremities or on axial skeleton.  Abdomen: Soft nontender  Neuro: Cranial nerves II through XII are intact, neurovascularly intact in all extremities with 2+ DTRs and 2+ pulses.  Lymph: No lymphadenopathy of posterior or anterior cervical chain or axillae bilaterally.  Gait antalgic.  MSK:  tender with limited range  of motion and stability and symmetric strength and tone of  elbows, wrist, hip, knee and ankles bilaterally.  Arthritic changes of multiple joints   Left shoulder exam does seem to be high riding.  Patient has 4 out of 5 strength but rotator cuff does have a positive painful arc and positive drop arm sign.  Mild limited range of motion in all planes.   [id number]; 15 additional minutes spent for Therapeutic exercises as stated in above notes.  This included exercises focusing on stretching, strengthening, with significant focus on eccentric aspects.   Long term goals include an improvement in range of motion, strength, endurance as well as avoiding reinjury. Patient's frequency would include in 1-2 times a day, 3-5 times a week for a duration of 6-12 weeks. Shoulder Exercises that included:  Basic scapular stabilization to include adduction and depression of scapula [place], focusing on proper movement and good control Internal and External rotation utilizing a theraband, with elbow tucked at side entire time Rows with theraband which was given    Proper technique shown and discussed handout in great detail with ATC.  All questions were discussed and answered.     Impression and Recommendations:     This case required medical decision making of moderate complexity. The above documentation has been reviewed and is accurate and complete Roger Pulley, DO        Note: This dictation was prepared with Dragon dictation along with smaller phrase technology. Any transcriptional errors that result from this process are unintentional.

## 2019-05-06 NOTE — Patient Instructions (Signed)
Good to see you.  Ice 20 minutes 2 times daily. Usually after activity and before bed. pennsaid pinkie amount topically 2 times daily as needed.  Turmeric 500mg  daily  Tart cherry extract 1200mg  at night Vitamin D 2000 IU daily  See me again in 4-6 weeks if not better we will get injection

## 2019-05-06 NOTE — Assessment & Plan Note (Signed)
Left rotator cuff tear.  We discussed with patient about different treatment options. Decided to start with formal physical therapy and topical anti-inflammatories and icing regimen.  Patient does have some baseline dementia but this can make it more challenging.  We discussed with continued hand pain follow-up again in 4 to 6 weeks and will consider the possibility of injection.

## 2019-05-07 ENCOUNTER — Telehealth: Payer: Self-pay | Admitting: Internal Medicine

## 2019-05-07 ENCOUNTER — Other Ambulatory Visit: Payer: Self-pay | Admitting: Internal Medicine

## 2019-05-07 MED ORDER — ESZOPICLONE 2 MG PO TABS: 2.0000 mg | ORAL_TABLET | Freq: Every evening | ORAL | 2 refills | Status: DC | PRN

## 2019-05-07 NOTE — Telephone Encounter (Signed)
Ok to try lunesta 2 mg qhs prn - done erx

## 2019-05-07 NOTE — Telephone Encounter (Signed)
Pt's son calling.  States that pt is having trouble sleeping.  This is causing a lot more difficulties with pt's dementia.  Pt's son would like to know if there is anything that can be RXd for the pt or if this is a normal progression of dementia. [name] can be reached at [phone].

## 2019-05-08 NOTE — Telephone Encounter (Signed)
Done erx 

## 2019-05-11 NOTE — Telephone Encounter (Signed)
Called pt, LVM.   

## 2019-05-13 ENCOUNTER — Ambulatory Visit: Payer: Medicare HMO | Admitting: Physical Therapy

## 2019-05-20 ENCOUNTER — Encounter: Payer: Self-pay | Admitting: Physical Therapy

## 2019-05-20 ENCOUNTER — Ambulatory Visit: Payer: Medicare HMO | Attending: Family Medicine | Admitting: Physical Therapy

## 2019-05-20 ENCOUNTER — Other Ambulatory Visit: Payer: Self-pay

## 2019-05-20 DIAGNOSIS — M25612 Stiffness of left shoulder, not elsewhere classified: Secondary | ICD-10-CM | POA: Diagnosis not present

## 2019-05-20 DIAGNOSIS — M25512 Pain in left shoulder: Secondary | ICD-10-CM | POA: Diagnosis not present

## 2019-05-20 NOTE — Therapy (Signed)
[name] [name], [place], [address] Phone: [phone]   Fax:  [phone]  Physical Therapy Evaluation  Patient Details  Name: Roger Hanson MRN: [id number] Date of Birth: Oct 17, 1934 Referring Provider (PT): Creig Hines   Encounter Date: 05/20/2019  PT End of Session - 05/20/19 1611    Visit Number  1    Date for PT Re-Evaluation  07/20/19    PT Start Time  [id number]    PT Stop Time  [id number]    PT Time Calculation (min)  39 min    Activity Tolerance  Patient tolerated treatment well    Behavior During Therapy  [facility] for tasks assessed/performed       Past Medical History:  Diagnosis Date  . Anxiety   . Arthritis of knee, degenerative 04/17/2019  . Chronic gastritis 04/17/2019  . Chronic insomnia 04/17/2019  . CKD (chronic kidney disease) stage 3, GFR 30-59 ml/min (HCC) 04/17/2019  . DDD (degenerative disc disease), lumbosacral   . Diabetes mellitus without complication ([place])   . Diverticulosis   . GERD (gastroesophageal reflux disease)   . Hearing loss   . HLD (hyperlipidemia) 04/17/2019  . Hypercholesteremia   . Hypertension   . Hypothyroidism   . Insomnia   . Iron deficiency anemia 04/17/2019  . Lumbar disc disease 04/17/2019  . Lumbar radiculopathy   . Peripheral neuropathy 04/17/2019  . Prostate cancer ([place])   . Psoriasis   . PUD (peptic ulcer disease)   . Pulmonary nodules   . Restless legs syndrome (RLS)   . RLS (restless legs syndrome) 04/17/2019  . Vitamin D deficiency 04/17/2019  . Vitamin D deficiency disease     Past Surgical History:  Procedure Laterality Date  . ANKLE SURGERY  2005  . ARTHROSCOPY KNEE W/ DRILLING Right 2007  . CARPAL TUNNEL RELEASE Bilateral 1999  . HEMORROIDECTOMY  1980  . LUMBAR FUSION  2013  . PROSTATECTOMY  2005  . SQUAMOUS CELL CARCINOMA EXCISION  2008   from scrotum    There were no vitals filed for this visit.   Subjective Assessment - 05/20/19 1538    Subjective  Patient reports putting up a lawn mower 2 months ago and fell injuring the left proximal shoulder.  The MD feels like there is a RC tear, he reports that since the injury her is moving a little better but still hurting all the time.    Limitations  Lifting;House hold activities    Patient Stated Goals  have less pain, better movments    Currently in Pain?  Yes    Pain Score  0-No pain    Pain Location  Shoulder    Pain Orientation  Left    Pain Descriptors / Indicators  Aching;Sharp    Pain Type  Acute pain    Pain Radiating Towards  denies    Pain Onset  More than a month ago    Pain Frequency  Intermittent    Aggravating Factors   lifitng, moving, reaching pain up to 7/10    Pain Relieving Factors  at rest, not moving pain can be 0/10    Effect of Pain on Daily Activities  some limiting due to pain with activity         [facility] PT Assessment - 05/20/19 0001      Assessment   Medical Diagnosis  left shoulder pain, RC tear    Referring Provider (PT)  Z. Tamala Julian  Onset Date/Surgical Date  03/19/19    Hand Dominance  Right    Prior Therapy  no      Precautions   Precautions  None      Balance Screen   Has the patient fallen in the past 6 months  Yes    How many times?  1    Has the patient had a decrease in activity level because of a fear of falling?   No    Is the patient reluctant to leave their home because of a fear of falling?   No      Home Environment   Additional Comments  lives with son, does some housework and some yardwork      Prior Function   Level of Independence  Independent    Vocation  Retired    Leisure  no exercise      Posture/Postural Control   Posture Comments  fwd head, rounded shoulders      ROM / Strength   AROM / PROM / Strength  AROM;PROM;Strength      AROM   AROM Assessment Site  Shoulder    Right/Left Shoulder  Left    Left Shoulder Flexion  80 Degrees    Left Shoulder ABduction  80 Degrees    Left Shoulder Internal  Rotation  30 Degrees    Left Shoulder External Rotation  5 Degrees      PROM   PROM Assessment Site  Shoulder    Right/Left Shoulder  Left    Left Shoulder Flexion  110 Degrees    Left Shoulder ABduction  100 Degrees    Left Shoulder Internal Rotation  40 Degrees    Left Shoulder External Rotation  40 Degrees      Strength   Strength Assessment Site  Shoulder    Right/Left Shoulder  Left    Left Shoulder Extension  4-/5    Left Shoulder ABduction  4-/5    Left Shoulder Internal Rotation  4-/5    Left Shoulder External Rotation  3-/5      Palpation   Palpation comment  some tenderness in the left deltoid and the biceps area      Special Tests   Other special tests  + empty can                Objective measurements completed on examination: See above findings.              PT Education - 05/20/19 1555    Education Details  started [employer]) Educated  Patient;Child(ren)    Methods  Explanation;Demonstration;Handout    Comprehension  Verbalized understanding;Returned demonstration       PT Short Term Goals - 05/20/19 1617      PT SHORT TERM GOAL #1   Title  independent with initial HEP    Time  2    Period  Weeks    Status  New        PT Long Term Goals - 05/20/19 1618      PT LONG TERM GOAL #1   Title  increase left shoulder AROM for flexion to 120 degrees    Time  8    Period  Weeks    Status  New      PT LONG TERM GOAL #2   Title  increase AROM of the left shoulder ER to 60 degrees    Time  8    Period  Weeks  Status  New      PT LONG TERM GOAL #3   Title  report no difficulty dressing or sleeping    Time  8    Period  Weeks    Status  New             Plan - 05/20/19 1612    Clinical Impression Statement  Patient reports a fall about 2 months ago, he fell on his left shoulder, the MD feels like he suffered a left RC tear.  He is very limited in his ROM, he does have pain, very limited with strength, reports  that pain is better over the past month.  Difficulty dressing.    Stability/Clinical Decision Making  Evolving/Moderate complexity    Clinical Decision Making  Moderate    Rehab Potential  Good    PT Frequency  1x / week    PT Duration  8 weeks    PT Treatment/Interventions  ADLs/Self Care Home Management;Therapeutic activities;Therapeutic exercise;Manual techniques;Electrical Stimulation       Patient will benefit from skilled therapeutic intervention in order to improve the following deficits and impairments:  Pain, Improper body mechanics, Increased muscle spasms, Postural dysfunction, Impaired UE functional use, Decreased activity tolerance, Decreased range of motion, Decreased strength  Visit Diagnosis: Acute pain of left shoulder - Plan: PT plan of care cert/re-cert  Stiffness of left shoulder, not elsewhere classified - Plan: PT plan of care cert/re-cert     Problem List Patient Active Problem List   Diagnosis Date Noted  . Left rotator cuff tear 05/06/2019  . Left shoulder pain 04/20/2019  . CKD (chronic kidney disease) stage 3, GFR 30-59 ml/min (HCC) 04/17/2019  . GERD (gastroesophageal reflux disease) 04/17/2019  . HLD (hyperlipidemia) 04/17/2019  . Chronic insomnia 04/17/2019  . Hypothyroidism 04/17/2019  . RLS (restless legs syndrome) 04/17/2019  . Arthritis of knee, degenerative 04/17/2019  . Lumbar disc disease 04/17/2019  . PUD (peptic ulcer disease) 04/17/2019  . Anxiety 04/17/2019  . Iron deficiency anemia 04/17/2019  . Peripheral neuropathy 04/17/2019  . Chronic gastritis 04/17/2019  . Vitamin D deficiency 04/17/2019  . Psoriasis 04/17/2019  . Hypertension 04/07/2019  . Dementia ([place]) 04/07/2019  . Preventative health care 04/07/2019  . Hypovolemia   . AKI (acute kidney injury) ([place]) 08/07/2017  . Enteritis 08/07/2013  . Acute renal failure ([place]) 08/07/2013  . Diabetes mellitus ([place]) 08/07/2013  . Prostate cancer ([place]) 02/04/2012  . Mixed  incontinence 02/04/2012    Sumner Boast., PT 05/20/2019, 4:21 PM  [address] [place], [place], [address] Phone: [phone]   Fax:  [phone]  Name: JADAN CUBBAGE MRN: [id number] Date of Birth: 1935/01/02

## 2019-06-03 ENCOUNTER — Other Ambulatory Visit: Payer: Self-pay

## 2019-06-03 ENCOUNTER — Encounter: Payer: Self-pay | Admitting: Physical Therapy

## 2019-06-03 ENCOUNTER — Ambulatory Visit: Payer: Medicare HMO | Admitting: Physical Therapy

## 2019-06-03 DIAGNOSIS — M25612 Stiffness of left shoulder, not elsewhere classified: Secondary | ICD-10-CM

## 2019-06-03 DIAGNOSIS — M25512 Pain in left shoulder: Secondary | ICD-10-CM | POA: Diagnosis not present

## 2019-06-03 NOTE — Therapy (Signed)
[place] [place], [place], [address] Phone: [phone]   Fax:  [phone]  Physical Therapy Treatment  Patient Details  Name: Roger Hanson MRN: [id number] Date of Birth: 1935-04-17 Referring Provider (PT): Creig Hines   Encounter Date: 06/03/2019  PT End of Session - 06/03/19 1331    Visit Number  2    PT Start Time  1311    PT Stop Time  1339    PT Time Calculation (min)  28 min    Activity Tolerance  Patient tolerated treatment well    Behavior During Therapy  [facility] for tasks assessed/performed       Past Medical History:  Diagnosis Date  . Anxiety   . Arthritis of knee, degenerative 04/17/2019  . Chronic gastritis 04/17/2019  . Chronic insomnia 04/17/2019  . CKD (chronic kidney disease) stage 3, GFR 30-59 ml/min (HCC) 04/17/2019  . DDD (degenerative disc disease), lumbosacral   . Diabetes mellitus without complication ([place])   . Diverticulosis   . GERD (gastroesophageal reflux disease)   . Hearing loss   . HLD (hyperlipidemia) 04/17/2019  . Hypercholesteremia   . Hypertension   . Hypothyroidism   . Insomnia   . Iron deficiency anemia 04/17/2019  . Lumbar disc disease 04/17/2019  . Lumbar radiculopathy   . Peripheral neuropathy 04/17/2019  . Prostate cancer ([place])   . Psoriasis   . PUD (peptic ulcer disease)   . Pulmonary nodules   . Restless legs syndrome (RLS)   . RLS (restless legs syndrome) 04/17/2019  . Vitamin D deficiency 04/17/2019  . Vitamin D deficiency disease     Past Surgical History:  Procedure Laterality Date  . ANKLE SURGERY  2005  . ARTHROSCOPY KNEE W/ DRILLING Right 2007  . CARPAL TUNNEL RELEASE Bilateral 1999  . HEMORROIDECTOMY  1980  . LUMBAR FUSION  2013  . PROSTATECTOMY  2005  . SQUAMOUS CELL CARCINOMA EXCISION  2008   from scrotum    There were no vitals filed for this visit.  Subjective Assessment - 06/03/19 1313    Subjective  Patient has not been in for  the past 2 weeks, he reports he has done some of the exercises but not a lot, reports that he is doing more with less pain.    Currently in Pain?  Yes    Pain Score  0-No pain         [facility] PT Assessment - 06/03/19 0001      AROM   Left Shoulder Flexion  120 Degrees    Left Shoulder ABduction  115 Degrees    Left Shoulder Internal Rotation  45 Degrees    Left Shoulder External Rotation  50 Degrees                           PT Education - 06/03/19 1330    Education Details  yellow and red tband scapular stabilization and RC strength, reviewed HEP form eval and went over with patien and his son    Person(s) Educated  Patient;Child(ren)    Methods  Explanation;Demonstration;Tactile cues;Verbal cues;Handout    Comprehension  Verbalized understanding;Returned demonstration;Verbal cues required;Tactile cues required       PT Short Term Goals - 06/03/19 1335      PT SHORT TERM GOAL #1   Title  independent with initial HEP    Status  Achieved  PT Long Term Goals - 06/03/19 1335      PT LONG TERM GOAL #1   Title  increase left shoulder AROM for flexion to 120 degrees    Status  Achieved      PT LONG TERM GOAL #2   Title  increase AROM of the left shoulder ER to 60 degrees    Status  Partially Met      PT LONG TERM GOAL #3   Title  report no difficulty dressing or sleeping    Status  Achieved            Plan - 06/03/19 1332    Clinical Impression Statement  Patient returns today with his son, he has much improved ROM, no pain, reports able to dress without help or pain.  We talked and reviewed the HEP from the eval and they have been doing some.  I issued yellow and red tband and gave new HEP and we perfromed together, patient does have some dementia with memory issues so the son said he would do with the patient at home.  Patient did need cues and help especialy with form but did welll without any incresae of pain    PT Next Visit Plan  D/C     Consulted and Agree with Plan of Care  Patient       Patient will benefit from skilled therapeutic intervention in order to improve the following deficits and impairments:  Pain, Improper body mechanics, Increased muscle spasms, Postural dysfunction, Impaired UE functional use, Decreased activity tolerance, Decreased range of motion, Decreased strength  Visit Diagnosis: Acute pain of left shoulder  Stiffness of left shoulder, not elsewhere classified     Problem List Patient Active Problem List   Diagnosis Date Noted  . Left rotator cuff tear 05/06/2019  . Left shoulder pain 04/20/2019  . CKD (chronic kidney disease) stage 3, GFR 30-59 ml/min (HCC) 04/17/2019  . GERD (gastroesophageal reflux disease) 04/17/2019  . HLD (hyperlipidemia) 04/17/2019  . Chronic insomnia 04/17/2019  . Hypothyroidism 04/17/2019  . RLS (restless legs syndrome) 04/17/2019  . Arthritis of knee, degenerative 04/17/2019  . Lumbar disc disease 04/17/2019  . PUD (peptic ulcer disease) 04/17/2019  . Anxiety 04/17/2019  . Iron deficiency anemia 04/17/2019  . Peripheral neuropathy 04/17/2019  . Chronic gastritis 04/17/2019  . Vitamin D deficiency 04/17/2019  . Psoriasis 04/17/2019  . Hypertension 04/07/2019  . Dementia ([place]) 04/07/2019  . Preventative health care 04/07/2019  . Hypovolemia   . AKI (acute kidney injury) ([place]) 08/07/2017  . Enteritis 08/07/2013  . Acute renal failure ([place]) 08/07/2013  . Diabetes mellitus ([place]) 08/07/2013  . Prostate cancer ([place]) 02/04/2012  . Mixed incontinence 02/04/2012    Sumner Boast., PT 06/03/2019, 1:40 PM  [address] [place], [place], [address] Phone: [phone]   Fax:  [phone]  Name: Roger Hanson MRN: [id number] Date of Birth: 1935/02/27

## 2019-06-11 ENCOUNTER — Other Ambulatory Visit: Payer: Self-pay | Admitting: Internal Medicine

## 2019-06-18 ENCOUNTER — Telehealth: Payer: Self-pay | Admitting: Internal Medicine

## 2019-06-18 MED ORDER — TRADJENTA 5 MG PO TABS: 5.0000 mg | ORAL_TABLET | Freq: Every morning | ORAL | 11 refills | Status: AC

## 2019-06-18 NOTE — Telephone Encounter (Signed)
Requested medication (s) are due for refill today: yes  Requested medication (s) are on the active medication list: yes   Future visit scheduled: yes  Notes to clinic:  Review for refill   Requested Prescriptions  Pending Prescriptions Disp Refills   TRADJENTA 5 MG TABS tablet 30 tablet 11    Sig: Take 1 tablet (5 mg total) by mouth every morning.     There is no refill protocol information for this order

## 2019-06-18 NOTE — Telephone Encounter (Signed)
Patient requesting TRADJENTA 5 MG TABS tablet, informed please allow 48 to 72 hour turn around time.    [facility] [place], [place] [phone] (Phone) [phone] (Fax)

## 2019-06-18 NOTE — Telephone Encounter (Signed)
Routing to CMA 

## 2019-06-19 ENCOUNTER — Ambulatory Visit: Payer: Medicare HMO

## 2019-06-22 ENCOUNTER — Ambulatory Visit (INDEPENDENT_AMBULATORY_CARE_PROVIDER_SITE_OTHER): Payer: Medicare HMO

## 2019-06-22 ENCOUNTER — Other Ambulatory Visit: Payer: Self-pay

## 2019-06-22 DIAGNOSIS — E538 Deficiency of other specified B group vitamins: Secondary | ICD-10-CM | POA: Diagnosis not present

## 2019-06-22 DIAGNOSIS — Z23 Encounter for immunization: Secondary | ICD-10-CM | POA: Diagnosis not present

## 2019-06-22 MED ORDER — CYANOCOBALAMIN 1000 MCG/ML IJ SOLN
1000.0000 ug | Freq: Once | INTRAMUSCULAR | Status: AC
  Administered 2019-06-22: 17:00:00 1000 ug via INTRAMUSCULAR

## 2019-06-22 NOTE — Progress Notes (Signed)
Medical screening examination/treatment/procedure(s) were performed by non-physician practitioner and as supervising physician I was immediately available for consultation/collaboration. I agree with above. James John, MD   

## 2019-07-15 ENCOUNTER — Ambulatory Visit: Payer: Self-pay

## 2019-07-15 NOTE — Telephone Encounter (Signed)
Daughter reports pt. Started having fever yesterday - 102. Came down with Tylenol. This morning 101.9. Having urinary frequency as well. Spoke with Sam in the practice. Daughter will take pt. To ED for evaluation. Reason for Disposition . Patient sounds very sick or weak to the triager  (Exception: mild weakness and hasn't taken fever medicine)  Answer Assessment - Initial Assessment Questions 1. TEMPERATURE: "What is the most recent temperature?"  "How was it measured?"      101.9  This morning 2. ONSET: "When did the fever start?"      Yesterday 3. SYMPTOMS: "Do you have any other symptoms besides the fever?"  (e.g., colds, headache, sore throat, earache, cough, rash, diarrhea, vomiting, abdominal pain)     Urinary frequency 4. CAUSE: If there are no symptoms, ask: "What do you think is causing the fever?"      Maybe a bladder infection 5. CONTACTS: "Does anyone else in the family have an infection?"     No 6. TREATMENT: "What have you done so far to treat this fever?" (e.g., medications)     Tylenol 7. IMMUNOCOMPROMISE: "Do you have of the following: diabetes, HIV positive, splenectomy, cancer chemotherapy, chronic steroid treatment, transplant patient, etc."     Diabetic 8. PREGNANCY: "Is there any chance you are pregnant?" "When was your last menstrual period?"     n/a 9. TRAVEL: "Have you traveled out of the country in the last month?" (e.g., travel history, exposures)     No  Protocols used: FEVER-A-AH

## 2019-07-15 NOTE — Telephone Encounter (Signed)
Noted  

## 2019-07-16 ENCOUNTER — Ambulatory Visit (INDEPENDENT_AMBULATORY_CARE_PROVIDER_SITE_OTHER)
Admission: RE | Admit: 2019-07-16 | Discharge: 2019-07-16 | Disposition: A | Discharge status: Home / Self Care | Payer: Medicare HMO | Source: Ambulatory Visit | Attending: Internal Medicine | Admitting: Internal Medicine

## 2019-07-16 ENCOUNTER — Encounter: Payer: Self-pay | Admitting: Internal Medicine

## 2019-07-16 ENCOUNTER — Other Ambulatory Visit (INDEPENDENT_AMBULATORY_CARE_PROVIDER_SITE_OTHER): Payer: Medicare HMO

## 2019-07-16 ENCOUNTER — Ambulatory Visit (INDEPENDENT_AMBULATORY_CARE_PROVIDER_SITE_OTHER): Payer: Medicare HMO | Admitting: Internal Medicine

## 2019-07-16 ENCOUNTER — Other Ambulatory Visit: Payer: Self-pay

## 2019-07-16 VITALS — BP 116/78 | HR 57 | Temp 98.0°F | Ht 68.0 in | Wt 136.0 lb

## 2019-07-16 DIAGNOSIS — R634 Abnormal weight loss: Secondary | ICD-10-CM | POA: Diagnosis not present

## 2019-07-16 DIAGNOSIS — R350 Frequency of micturition: Secondary | ICD-10-CM | POA: Diagnosis not present

## 2019-07-16 DIAGNOSIS — E119 Type 2 diabetes mellitus without complications: Secondary | ICD-10-CM | POA: Diagnosis not present

## 2019-07-16 DIAGNOSIS — E538 Deficiency of other specified B group vitamins: Secondary | ICD-10-CM

## 2019-07-16 DIAGNOSIS — R509 Fever, unspecified: Secondary | ICD-10-CM

## 2019-07-16 DIAGNOSIS — R0602 Shortness of breath: Secondary | ICD-10-CM | POA: Diagnosis not present

## 2019-07-16 LAB — URINALYSIS, ROUTINE W REFLEX MICROSCOPIC
Bilirubin Urine: NEGATIVE
Ketones, ur: NEGATIVE
Nitrite: NEGATIVE
Specific Gravity, Urine: 1.015 (ref 1.000–1.030)
Urine Glucose: NEGATIVE
Urobilinogen, UA: 0.2 (ref 0.0–1.0)
pH: 5.5 (ref 5.0–8.0)

## 2019-07-16 LAB — POCT GLYCOSYLATED HEMOGLOBIN (HGB A1C): Hemoglobin A1C: 6.1 % — AB (ref 4.0–5.6)

## 2019-07-16 MED ORDER — CEFTRIAXONE SODIUM 1 G IJ SOLR
1.0000 g | Freq: Once | INTRAMUSCULAR | Status: AC
  Administered 2019-07-16: 1 g via INTRAMUSCULAR

## 2019-07-16 MED ORDER — CYANOCOBALAMIN 1000 MCG/ML IJ SOLN
1000.0000 ug | Freq: Once | INTRAMUSCULAR | Status: AC
  Administered 2019-07-16: 1000 ug via INTRAMUSCULAR

## 2019-07-16 MED ORDER — LEVOFLOXACIN 500 MG PO TABS: 500.0000 mg | ORAL_TABLET | Freq: Every day | ORAL | 0 refills | Status: AC

## 2019-07-16 NOTE — Assessment & Plan Note (Signed)
Also for urine and culture,  to f/u any worsening symptoms or concerns 

## 2019-07-16 NOTE — Patient Instructions (Addendum)
Your A1c was OK today  Please take 3 cans of ensure per day  - 1 between each meal  You had the antibiotic shot (rocephin), and the B12 shot today  Please make sure to see your dentist about your teeth  Please continue all other medications as before, and refills have been done if requested.  Please have the pharmacy call with any other refills you may need.  Please continue your efforts at being more active, low cholesterol diet, and weight control.  You are otherwise up to date with prevention measures today.  Please keep your appointments with your specialists as you may have planned  Please go to the XRAY Department in the Basement (go straight as you get off the elevator) for the x-ray testing  Please go to the LAB in the Basement (turn left off the elevator) for the tests to be done today - just the urine testing today  Please return in 3 months, or sooner if needed

## 2019-07-16 NOTE — Assessment & Plan Note (Signed)
Etiology unclear, for ensure 3 cans per day, also for cxr

## 2019-07-16 NOTE — Progress Notes (Signed)
Subjective:    Patient ID: Roger Hanson, male    DOB: May 18, 1935, 84 y.o.   MRN: [id number]  HPI  Here with wife who gives hx, states pt with 3 days onset generalized weakness, fever, associated with new onset urinary frequency; Denies urinary symptoms such as dysuria, frequency, urgency, flank pain, hematuria or n/v.  Has been getting his b12 shots monthly.  Lost 14 lbs since last visit, not eating well. Does also have poor dentition, not well able to eat with only 2 teeth. Wt Readings from Last 3 Encounters:  07/16/19 136 lb (61.7 kg)  05/06/19 150 lb (68 kg)  04/20/19 147 lb (66.7 kg)  Now taking glimeparide at 2 mg tiwce per day (half dosing);  Pt denies chest pain, increasing sob or doe, wheezing, orthopnea, PND, increased LE swelling, palpitations, dizziness or syncope.  Pt denies new neurological symptoms such as new headache, or facial or extremity weakness or numbness.  Pt denies polydipsia, polyuria,  Pt states overall good compliance with meds..   Past Medical History:  Diagnosis Date  . Anxiety   . Arthritis of knee, degenerative 04/17/2019  . Chronic gastritis 04/17/2019  . Chronic insomnia 04/17/2019  . CKD (chronic kidney disease) stage 3, GFR 30-59 ml/min 04/17/2019  . DDD (degenerative disc disease), lumbosacral   . Diabetes mellitus without complication ([place])   . Diverticulosis   . GERD (gastroesophageal reflux disease)   . Hearing loss   . HLD (hyperlipidemia) 04/17/2019  . Hypercholesteremia   . Hypertension   . Hypothyroidism   . Insomnia   . Iron deficiency anemia 04/17/2019  . Lumbar disc disease 04/17/2019  . Lumbar radiculopathy   . Peripheral neuropathy 04/17/2019  . Prostate cancer ([place])   . Psoriasis   . PUD (peptic ulcer disease)   . Pulmonary nodules   . Restless legs syndrome (RLS)   . RLS (restless legs syndrome) 04/17/2019  . Vitamin D deficiency 04/17/2019  . Vitamin D deficiency disease    Past Surgical History:  Procedure Laterality Date  .  ANKLE SURGERY  2005  . ARTHROSCOPY KNEE W/ DRILLING Right 2007  . CARPAL TUNNEL RELEASE Bilateral 1999  . HEMORROIDECTOMY  1980  . LUMBAR FUSION  2013  . PROSTATECTOMY  2005  . SQUAMOUS CELL CARCINOMA EXCISION  2008   from scrotum    reports that he has never smoked. He has never used smokeless tobacco. He reports that he does not drink alcohol or use drugs. family history includes Breast cancer in his mother; CVA in his mother; Diabetes in his brother and father; Hypertension in his brother. Allergies  Allergen Reactions  . Glipizide Other (See Comments)    Erythema multiforme  . Lovastatin     Erythema multiforme  . Omeprazole Nausea And Vomiting  . Protonix [Pantoprazole Sodium] Nausea Only   Current Outpatient Medications on File Prior to Visit  Medication Sig Dispense Refill  . ALPRAZolam (XANAX) 0.5 MG tablet Take 1 tablet (0.5 mg total) by mouth at bedtime as needed for anxiety. (Patient taking differently: Take 0.25 mg by mouth at bedtime as needed for anxiety. ) 30 tablet 5  . amLODipine (NORVASC) 10 MG tablet Take 1 tablet (10 mg total) by mouth daily. (Patient taking differently: Take 5 mg by mouth daily. ) 30 tablet 0  . aspirin 81 MG tablet Take 81 mg by mouth daily.    . cholecalciferol (VITAMIN D) 1000 UNITS tablet Take 2,000 Units by mouth daily.    Marland Kitchen  ESOMEPRAZOLE MAGNESIUM PO Take 22.3 mg by mouth every morning.     Marland Kitchen glimepiride (AMARYL) 4 MG tablet Take 4 mg by mouth 2 (two) times daily.     Marland Kitchen levothyroxine (SYNTHROID) 100 MCG tablet TAKE 1 TABLET BY MOUTH EVERYDAY AT BEDTIME 90 tablet 3  . Levothyroxine Sodium 100 MCG CAPS Take 100 mcg by mouth daily before breakfast.     . ondansetron (ZOFRAN) 4 MG tablet Take 4 mg by mouth 2 (two) times daily as needed for nausea or vomiting.    . pravastatin (PRAVACHOL) 80 MG tablet Take 1 tablet (80 mg total) by mouth every evening. 90 tablet 1  . Suvorexant (BELSOMRA) 5 MG TABS Take 5 mg by mouth at bedtime as needed. 90  tablet 1  . TRADJENTA 5 MG TABS tablet Take 1 tablet (5 mg total) by mouth every morning. 30 tablet 11  . [DISCONTINUED] eszopiclone (LUNESTA) 2 MG TABS tablet Take 1 tablet (2 mg total) by mouth at bedtime as needed for sleep. Take immediately before bedtime 30 tablet 2   No current facility-administered medications on file prior to visit.    Review of Systems  Constitutional: Negative for other unusual diaphoresis or sweats HENT: Negative for ear discharge or swelling Eyes: Negative for other worsening visual disturbances Respiratory: Negative for stridor or other swelling  Gastrointestinal: Negative for worsening distension or other blood Genitourinary: Negative for retention or other urinary change Musculoskeletal: Negative for other MSK pain or swelling Skin: Negative for color change or other new lesions Neurological: Negative for worsening tremors and other numbness  Psychiatric/Behavioral: Negative for worsening agitation or other fatigue All otherwise neg per pt     Objective:   Physical Exam BP 116/78   Pulse (!) 57   Temp 98 F (36.7 C) (Oral)   Ht 5\' 8"  (1.727 m)   Wt 136 lb (61.7 kg)   SpO2 97%   BMI 20.68 kg/m  VS noted, weak, non toxic Constitutional: Pt appears in NAD HENT: Head: NCAT.  Right Ear: External ear normal.  Left Ear: External ear normal.  Eyes: . Pupils are equal, round, and reactive to light. Conjunctivae and EOM are normal;  Poor dentition Nose: without d/c or deformity Neck: Neck supple. Gross normal ROM Cardiovascular: Normal rate and regular rhythm.   Pulmonary/Chest: Effort normal and breath sounds without rales or wheezing.  Abd:  Soft, NT, ND, + BS, no organomegaly Neurological: Pt is alert. At baseline orientation, motor grossly intact Skin: Skin is warm. No rashes, other new lesions, no LE edema Psychiatric: Pt behavior is normal without agitation  All otherwise neg per pt   Lab Results  Component Value Date   WBC 5.3 04/07/2019    HGB 13.7 04/07/2019   HCT 41.5 04/07/2019   PLT 164.0 04/07/2019   GLUCOSE 100 (H) 04/07/2019   CHOL 161 04/07/2019   TRIG 82.0 04/07/2019   HDL 48.30 04/07/2019   LDLCALC 96 04/07/2019   ALT 9 04/07/2019   AST 11 04/07/2019   NA 143 04/07/2019   K 4.0 04/07/2019   CL 104 04/07/2019   CREATININE 1.04 04/07/2019   BUN 12 04/07/2019   CO2 32 04/07/2019   TSH 2.09 04/07/2019   PSA 0.00 Repeated and verified X2. (L) 04/07/2019   INR 1.0 02/26/2007   HGBA1C 6.9 (H) 04/07/2019   MICROALBUR <0.7 04/07/2019   POCT glycosylated hemoglobin (Hb A1C) Order: [id number] Status:  Final result Visible to patient:  No (not released) Dx:  Type  2 diabetes mellitus without comp...  Ref Range & Units 14:53 56mo ago 80yr ago 66yr ago  Hemoglobin A1C 4.0 - 5.6 % 6.1Abnormal   6.9High  R, CM  7.2High  R, CM  High  7.4              Assessment & Plan:

## 2019-07-16 NOTE — Assessment & Plan Note (Signed)
Also for B12 IM 1000 mcg

## 2019-07-16 NOTE — Assessment & Plan Note (Addendum)
High suspicion for UTI as exam o/w benign, for rocephin IM 1 gm, and levaquin course asd,  to f/u any worsening symptoms or concerns  Note:  Total time for pt hx, exam, review of record with pt in the room, determination of diagnoses and plan for further eval and tx is > 40 min, with over 50% spent in coordination and counseling of patient including the differential dx, tx, further evaluation and other management of urinary freq, wt loss, DM, fever, b12 deficiency

## 2019-07-16 NOTE — Assessment & Plan Note (Signed)
stable overall by history and exam, recent data reviewed with pt, and pt to continue medical treatment as before,  to f/u any worsening symptoms or concerns, ok to d/c the glimeparide, cont tradjenta for now

## 2019-07-18 LAB — URINE CULTURE
MICRO NUMBER:: 1044957
SPECIMEN QUALITY:: ADEQUATE

## 2019-07-22 ENCOUNTER — Ambulatory Visit: Payer: Medicare HMO

## 2019-08-19 ENCOUNTER — Other Ambulatory Visit: Payer: Self-pay

## 2019-08-19 ENCOUNTER — Ambulatory Visit (INDEPENDENT_AMBULATORY_CARE_PROVIDER_SITE_OTHER): Payer: Medicare HMO | Admitting: *Deleted

## 2019-08-19 DIAGNOSIS — E538 Deficiency of other specified B group vitamins: Secondary | ICD-10-CM

## 2019-08-19 MED ORDER — CYANOCOBALAMIN 1000 MCG/ML IJ SOLN
1000.0000 ug | Freq: Once | INTRAMUSCULAR | Status: AC
  Administered 2019-08-19: 1000 ug via INTRAMUSCULAR

## 2019-08-19 NOTE — Progress Notes (Signed)
Medical screening examination/treatment/procedure(s) were performed by non-physician practitioner and as supervising physician I was immediately available for consultation/collaboration. I agree with above. Monda Chastain, MD   

## 2019-08-29 IMAGING — CT CT RENAL STONE PROTOCOL
2 of 3 series · 16 of 46 positions shown, 18 images · non-contrast
Comparison: 08/07/2013

CLINICAL DATA: Five check, vomiting and diarrhea for 1 week.

EXAM:
CT ABDOMEN AND PELVIS WITHOUT CONTRAST
TECHNIQUE: Multidetector CT imaging of the abdomen and pelvis was performed
following the standard protocol without IV contrast.

[Series 3: lung · axial · 0.77mm/px · z∈[-72,+18]mm · 13 of 53 slices shown, 15 images]
[im 4/53  soft-tissue]
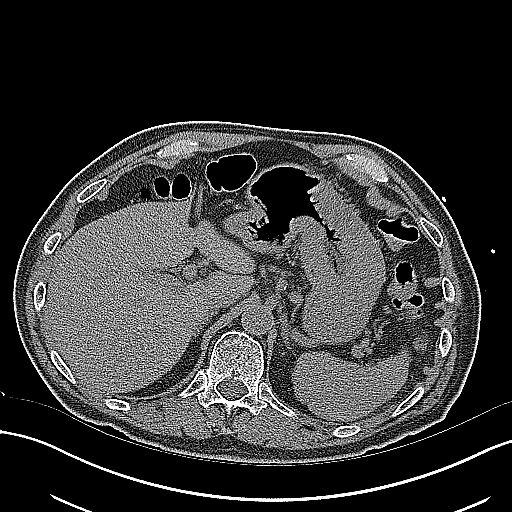
[im 4/53  bone]
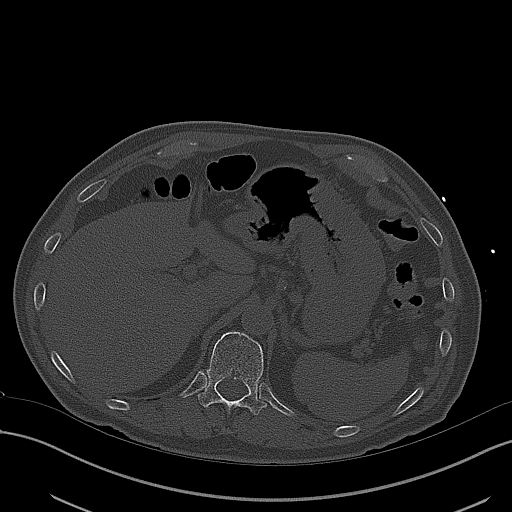
[im 7/53  soft-tissue]
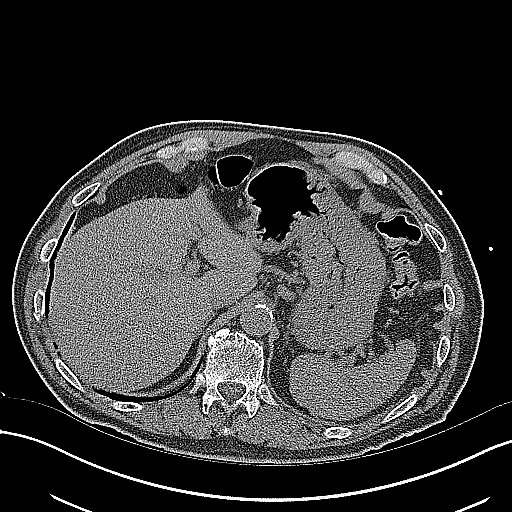
[im 11/53  soft-tissue]
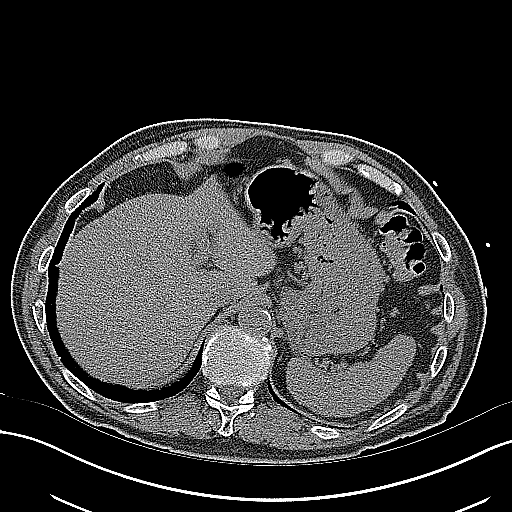
[im 16/53  soft-tissue]
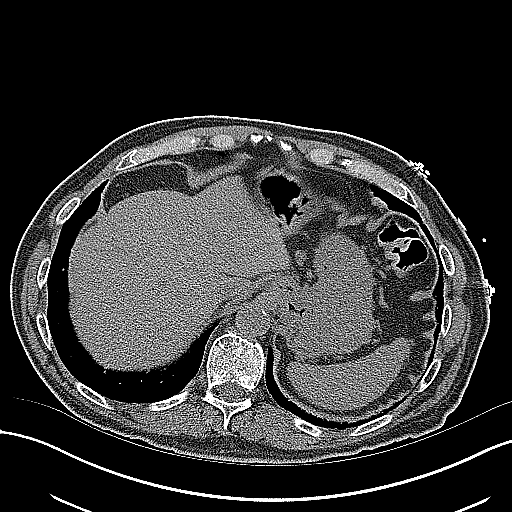
[im 19/53  soft-tissue]
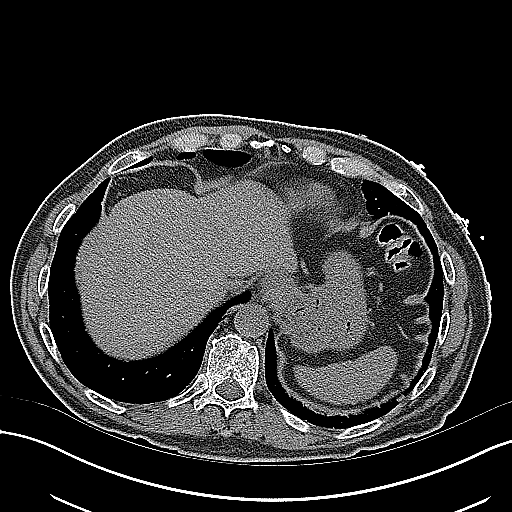
[im 22/53  soft-tissue]
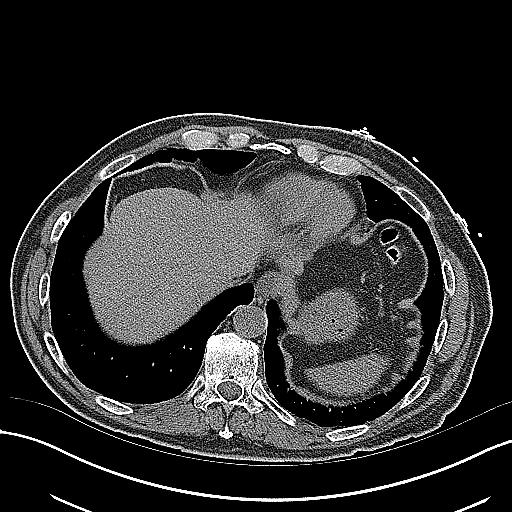
[im 27/53  soft-tissue]
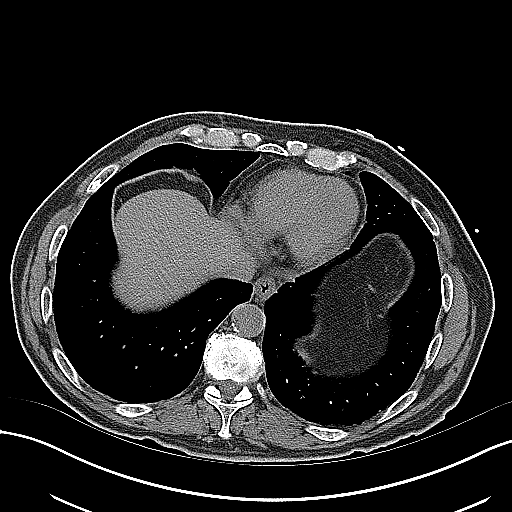
[im 31/53  soft-tissue]
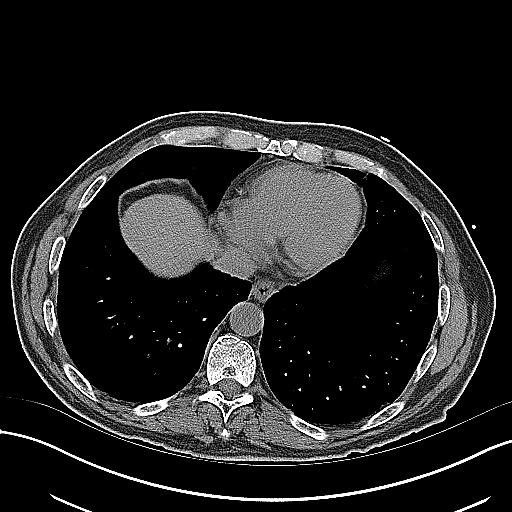
[im 34/53  soft-tissue]
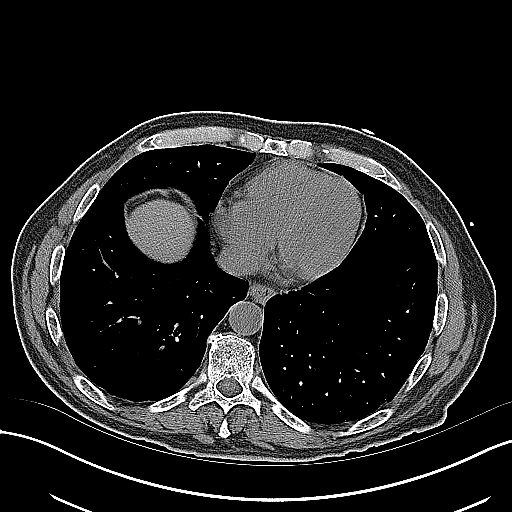
[im 34/53  bone]
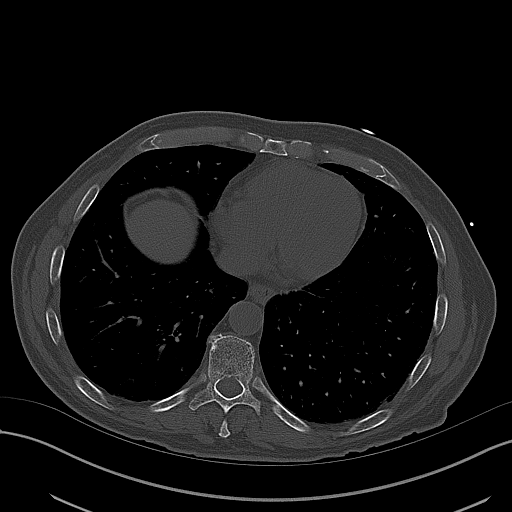
[im 37/53  soft-tissue]
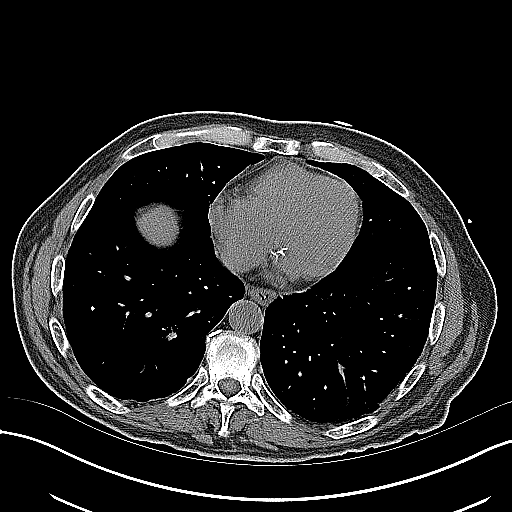
[im 42/53  soft-tissue]
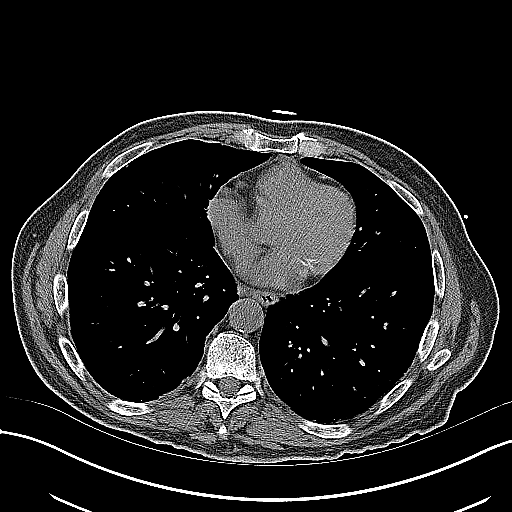
[im 46/53  soft-tissue]
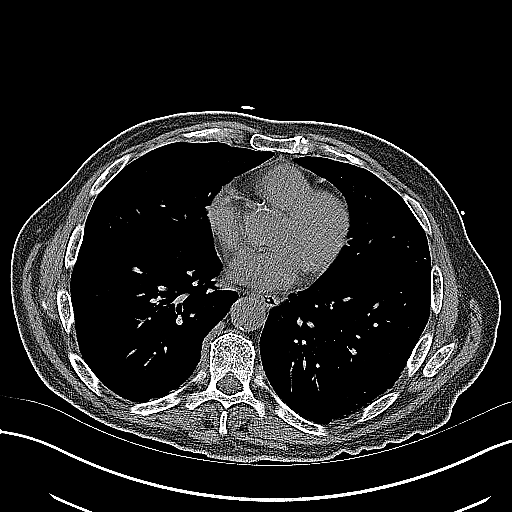
[im 49/53  soft-tissue]
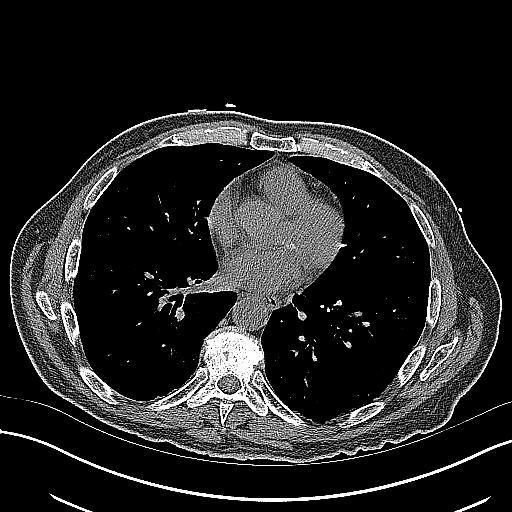

[Series 5: coronal · coronal · 0.74mm/px · 3 of 129 slices shown]
[im 43/129  soft-tissue]
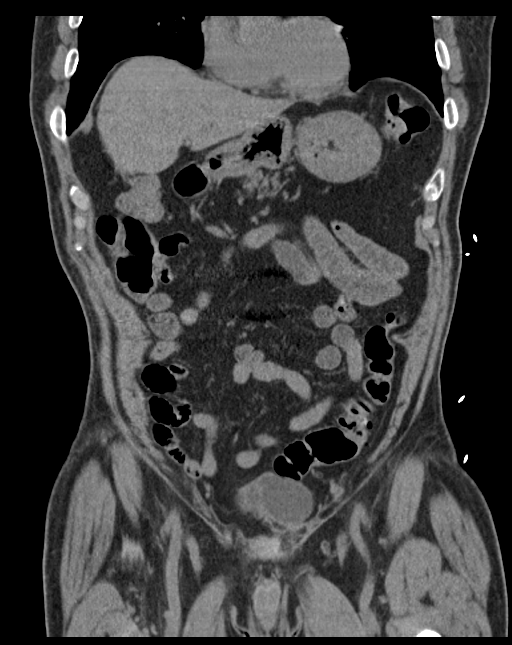
[im 57/129  soft-tissue]
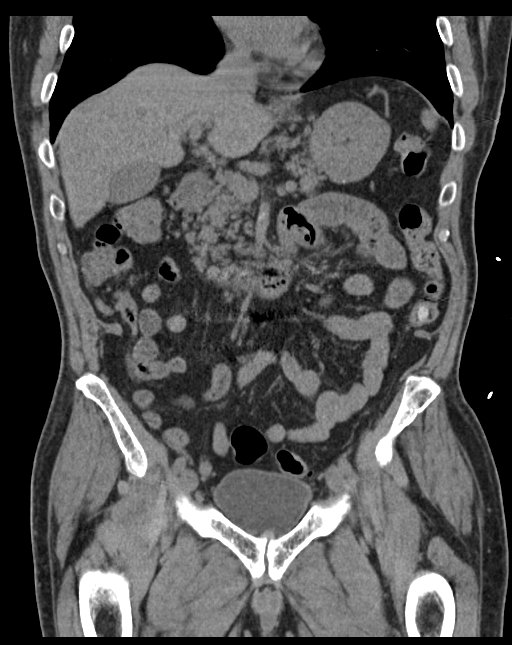
[im 72/129  soft-tissue]
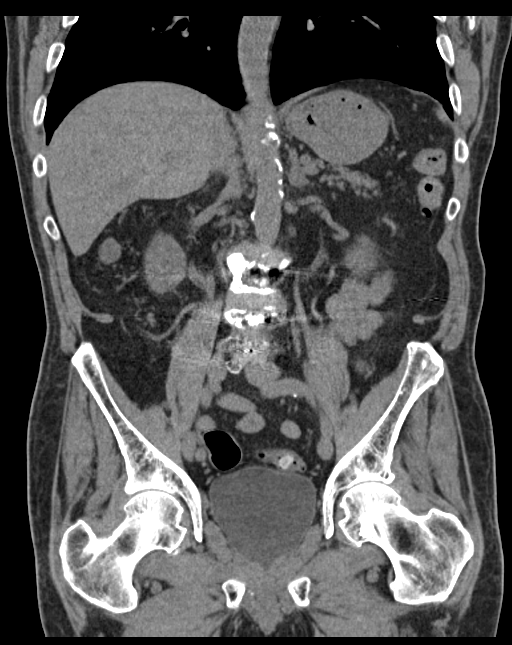

[16 of 46 positions shown; findings below may reference images not displayed]

FINDINGS: Lower chest: The lung bases are clear of acute process. No pleural
effusion or pulmonary lesions. The heart is normal in size. No
pericardial effusion. The distal esophagus and aorta are
unremarkable.

Hepatobiliary: No focal hepatic lesions or intrahepatic biliary
dilatation. The gallbladder is normal. No common bile duct
dilatation.

Pancreas: No mass, inflammation or ductal dilatation.

Spleen: Normal size.  No focal lesions.

Adrenals/Urinary Tract: The adrenal glands and kidneys are
unremarkable. No renal, ureteral or bladder calculi or mass.

Stomach/Bowel: The stomach, duodenum, small bowel and colon are
unremarkable. No acute inflammatory changes, mass lesions or
obstructive findings. The terminal ileum is normal. The appendix is
normal. Moderate descending and sigmoid colon diverticulosis but no
findings for acute diverticulitis.

Vascular/Lymphatic: Moderate to advanced atherosclerotic
calcifications involving the aorta and iliac arteries. No mesenteric
or retroperitoneal mass or adenopathy.

Reproductive: The prostate gland is surgically absent.

Other: No pelvic mass or adenopathy. No free pelvic fluid
collections. No inguinal mass or adenopathy. No abdominal wall
hernia or subcutaneous lesions.

Musculoskeletal: No acute bony findings. There are a few small
stable sclerotic pelvic lesions. Prior RF lumbar surgery changes
noted with laminectomies and fixation heart wire. The the L3 pedicle
screws are loose.
IMPRESSION: 1. No acute abdominal/pelvic findings, mass lesions or adenopathy.
2. No findings for inflammatory bowel process or obstruction.
3. Advanced atherosclerotic calcifications involving the aorta and
iliac arteries.
4. Status post prostatectomy. There are few small scattered
sclerotic bone lesions which are stable.
5. Loose pedicle screws are noted at L3.

## 2019-09-24 ENCOUNTER — Other Ambulatory Visit: Payer: Medicare HMO

## 2019-09-24 ENCOUNTER — Ambulatory Visit: Payer: Medicare HMO

## 2019-09-30 ENCOUNTER — Ambulatory Visit (INDEPENDENT_AMBULATORY_CARE_PROVIDER_SITE_OTHER): Payer: Medicare HMO

## 2019-09-30 ENCOUNTER — Other Ambulatory Visit: Payer: Self-pay

## 2019-09-30 DIAGNOSIS — E538 Deficiency of other specified B group vitamins: Secondary | ICD-10-CM

## 2019-09-30 LAB — VITAMIN B12: Vitamin B-12: 254 pg/mL (ref 211–911)

## 2019-09-30 MED ORDER — CYANOCOBALAMIN 1000 MCG/ML IJ SOLN
1000.0000 ug | Freq: Once | INTRAMUSCULAR | Status: AC
  Administered 2019-09-30: 1000 ug via INTRAMUSCULAR

## 2019-09-30 NOTE — Progress Notes (Addendum)
V  Medical screening examination/treatment/procedure(s) were performed by non-physician practitioner and as supervising physician I was immediately available for consultation/collaboration. I agree with above. Cathlean Cower, MD

## 2019-10-08 ENCOUNTER — Encounter: Payer: Self-pay | Admitting: Internal Medicine

## 2019-10-08 ENCOUNTER — Other Ambulatory Visit: Payer: Self-pay

## 2019-10-08 ENCOUNTER — Ambulatory Visit (INDEPENDENT_AMBULATORY_CARE_PROVIDER_SITE_OTHER): Payer: Medicare HMO | Admitting: Internal Medicine

## 2019-10-08 ENCOUNTER — Ambulatory Visit: Payer: Medicare HMO | Admitting: Internal Medicine

## 2019-10-08 VITALS — BP 140/80 | HR 64 | Temp 97.1°F | Wt 141.6 lb

## 2019-10-08 DIAGNOSIS — E119 Type 2 diabetes mellitus without complications: Secondary | ICD-10-CM | POA: Diagnosis not present

## 2019-10-08 DIAGNOSIS — F419 Anxiety disorder, unspecified: Secondary | ICD-10-CM

## 2019-10-08 DIAGNOSIS — E559 Vitamin D deficiency, unspecified: Secondary | ICD-10-CM | POA: Diagnosis not present

## 2019-10-08 DIAGNOSIS — Z0000 Encounter for general adult medical examination without abnormal findings: Secondary | ICD-10-CM

## 2019-10-08 LAB — CBC WITH DIFFERENTIAL/PLATELET
Basophils Absolute: 0.1 K/uL (ref 0.0–0.1)
Basophils Relative: 1.2 % (ref 0.0–3.0)
Eosinophils Absolute: 0.2 K/uL (ref 0.0–0.7)
Eosinophils Relative: 4.8 % (ref 0.0–5.0)
HCT: 37.3 % — ABNORMAL LOW (ref 39.0–52.0)
Hemoglobin: 12.4 g/dL — ABNORMAL LOW (ref 13.0–17.0)
Lymphocytes Relative: 26.7 % (ref 12.0–46.0)
Lymphs Abs: 1.3 K/uL (ref 0.7–4.0)
MCHC: 33.2 g/dL (ref 30.0–36.0)
MCV: 92.2 fl (ref 78.0–100.0)
Monocytes Absolute: 0.4 K/uL (ref 0.1–1.0)
Monocytes Relative: 7.9 % (ref 3.0–12.0)
Neutro Abs: 3 K/uL (ref 1.4–7.7)
Neutrophils Relative %: 59.4 % (ref 43.0–77.0)
Platelets: 135 K/uL — ABNORMAL LOW (ref 150.0–400.0)
RBC: 4.05 Mil/uL — ABNORMAL LOW (ref 4.22–5.81)
RDW: 14.2 % (ref 11.5–15.5)
WBC: 5 K/uL (ref 4.0–10.5)

## 2019-10-08 LAB — MICROALBUMIN / CREATININE URINE RATIO
Creatinine,U: 108.8 mg/dL
Microalb Creat Ratio: 1.9 mg/g (ref 0.0–30.0)
Microalb, Ur: 2.1 mg/dL — ABNORMAL HIGH (ref 0.0–1.9)

## 2019-10-08 LAB — LIPID PANEL
Cholesterol: 139 mg/dL (ref 0–200)
HDL: 51.2 mg/dL
LDL Cholesterol: 76 mg/dL (ref 0–99)
NonHDL: 87.89
Total CHOL/HDL Ratio: 3
Triglycerides: 59 mg/dL (ref 0.0–149.0)
VLDL: 11.8 mg/dL (ref 0.0–40.0)

## 2019-10-08 LAB — HEMOGLOBIN A1C: Hgb A1c MFr Bld: 8.4 % — ABNORMAL HIGH (ref 4.6–6.5)

## 2019-10-08 LAB — BASIC METABOLIC PANEL WITH GFR
BUN: 18 mg/dL (ref 6–23)
CO2: 32 meq/L (ref 19–32)
Calcium: 9.5 mg/dL (ref 8.4–10.5)
Chloride: 104 meq/L (ref 96–112)
Creatinine, Ser: 1.1 mg/dL (ref 0.40–1.50)
GFR: 63.99 mL/min
Glucose, Bld: 184 mg/dL — ABNORMAL HIGH (ref 70–99)
Potassium: 3.8 meq/L (ref 3.5–5.1)
Sodium: 140 meq/L (ref 135–145)

## 2019-10-08 LAB — HEPATIC FUNCTION PANEL
ALT: 14 U/L (ref 0–53)
AST: 12 U/L (ref 0–37)
Albumin: 3.7 g/dL (ref 3.5–5.2)
Alkaline Phosphatase: 107 U/L (ref 39–117)
Bilirubin, Direct: 0.2 mg/dL (ref 0.0–0.3)
Total Bilirubin: 1 mg/dL (ref 0.2–1.2)
Total Protein: 6.2 g/dL (ref 6.0–8.3)

## 2019-10-08 LAB — TSH: TSH: 3.85 u[IU]/mL (ref 0.35–4.50)

## 2019-10-08 MED ORDER — ONDANSETRON HCL 4 MG PO TABS: 4.0000 mg | ORAL_TABLET | Freq: Two times a day (BID) | ORAL | 1 refills | Status: DC | PRN

## 2019-10-08 MED ORDER — ALPRAZOLAM 0.25 MG PO TABS: 0.2500 mg | ORAL_TABLET | Freq: Every day | ORAL | 5 refills | Status: DC | PRN

## 2019-10-08 NOTE — Patient Instructions (Signed)
Your xanax was decreased to the 0.25 mg daily as needed  Please continue all other medications as before, and refills have been done if requested - the zofr  Please have the pharmacy call with any other refills you may need.  Please continue your efforts at being more active, low cholesterol diet, and weight control.  You are otherwise up to date with prevention measures today.  Please keep your appointments with your specialists as you may have planned  Please go to the LAB at the blood drawing area for the tests to be done  You will be contacted by phone if any changes need to be made immediately.  Otherwise, you will receive a letter about your results with an explanation, but please check with MyChart first.  Please remember to sign up for MyChart if you have not done so, as this will be important to you in the future with finding out test results, communicating by private email, and scheduling acute appointments online when needed.  Please make an Appointment to return in 6 months, or sooner if needed

## 2019-10-08 NOTE — Progress Notes (Signed)
Subjective:    Patient ID: Roger Hanson, male    DOB: 03-10-1935, 85 y.o.   MRN: [id number]  HPI   Here for wellness and f/u;  Overall doing ok;  Pt denies Chest pain, worsening SOB, DOE, wheezing, orthopnea, PND, worsening LE edema, palpitations, dizziness or syncope.  Pt denies neurological change such as new headache, facial or extremity weakness.  Pt denies polydipsia, polyuria, or low sugar symptoms. Pt states overall good compliance with treatment and medications, good tolerability, and has been trying to follow appropriate diet.  Pt denies worsening depressive symptoms, suicidal ideation or panic. No fever, night sweats, wt loss, loss of appetite, or other constitutional symptoms.  Pt states good ability with ADL's, has low fall risk, home safety reviewed and adequate, no other significant changes in hearing or vision, and only occasionally active with exercise. Plans to call for eye doctor soon due to pandemic. Asks for decreased xanax due to sleeping too much during the day. Past Medical History:  Diagnosis Date  . Anxiety   . Arthritis of knee, degenerative 04/17/2019  . Chronic gastritis 04/17/2019  . Chronic insomnia 04/17/2019  . CKD (chronic kidney disease) stage 3, GFR 30-59 ml/min 04/17/2019  . DDD (degenerative disc disease), lumbosacral   . Diabetes mellitus without complication ([place])   . Diverticulosis   . GERD (gastroesophageal reflux disease)   . Hearing loss   . HLD (hyperlipidemia) 04/17/2019  . Hypercholesteremia   . Hypertension   . Hypothyroidism   . Insomnia   . Iron deficiency anemia 04/17/2019  . Lumbar disc disease 04/17/2019  . Lumbar radiculopathy   . Peripheral neuropathy 04/17/2019  . Prostate cancer ([place])   . Psoriasis   . PUD (peptic ulcer disease)   . Pulmonary nodules   . Restless legs syndrome (RLS)   . RLS (restless legs syndrome) 04/17/2019  . Vitamin D deficiency 04/17/2019  . Vitamin D deficiency disease    Past Surgical History:  Procedure  Laterality Date  . ANKLE SURGERY  2005  . ARTHROSCOPY KNEE W/ DRILLING Right 2007  . CARPAL TUNNEL RELEASE Bilateral 1999  . HEMORROIDECTOMY  1980  . LUMBAR FUSION  2013  . PROSTATECTOMY  2005  . SQUAMOUS CELL CARCINOMA EXCISION  2008   from scrotum    reports that he has never smoked. He has never used smokeless tobacco. He reports that he does not drink alcohol or use drugs. family history includes Breast cancer in his mother; CVA in his mother; Diabetes in his brother and father; Hypertension in his brother. Allergies  Allergen Reactions  . Glipizide Other (See Comments)    Erythema multiforme  . Lovastatin     Erythema multiforme  . Omeprazole Nausea And Vomiting  . Protonix [Pantoprazole Sodium] Nausea Only   Current Outpatient Medications on File Prior to Visit  Medication Sig Dispense Refill  . cholecalciferol (VITAMIN D) 1000 UNITS tablet Take 2,000 Units by mouth daily.    Marland Kitchen levothyroxine (SYNTHROID) 100 MCG tablet TAKE 1 TABLET BY MOUTH EVERYDAY AT BEDTIME 90 tablet 3  . Levothyroxine Sodium 100 MCG CAPS Take 100 mcg by mouth daily before breakfast.     . pravastatin (PRAVACHOL) 80 MG tablet Take 1 tablet (80 mg total) by mouth every evening. 90 tablet 1  . TRADJENTA 5 MG TABS tablet Take 1 tablet (5 mg total) by mouth every morning. 30 tablet 11  . amLODipine (NORVASC) 10 MG tablet Take 1 tablet (10 mg total) by mouth daily. (Patient  not taking: Reported on 10/08/2019) 30 tablet 0  . aspirin 81 MG tablet Take 81 mg by mouth daily.    Marland Kitchen ESOMEPRAZOLE MAGNESIUM PO Take 22.3 mg by mouth every morning.     Marland Kitchen glimepiride (AMARYL) 4 MG tablet Take 4 mg by mouth 2 (two) times daily.     . Suvorexant (BELSOMRA) 5 MG TABS Take 5 mg by mouth at bedtime as needed. (Patient not taking: Reported on 10/08/2019) 90 tablet 1  . [DISCONTINUED] eszopiclone (LUNESTA) 2 MG TABS tablet Take 1 tablet (2 mg total) by mouth at bedtime as needed for sleep. Take immediately before bedtime 30 tablet 2     No current facility-administered medications on file prior to visit.   Review of Systems All otherwise neg per pt     Objective:   Physical Exam BP 140/80   Pulse 64   Temp (!) 97.1 F (36.2 C)   Wt 141 lb 9.6 oz (64.2 kg)   SpO2 99%   BMI 21.53 kg/m  VS noted,  Constitutional: Pt appears in NAD HENT: Head: NCAT.  Right Ear: External ear normal.  Left Ear: External ear normal.  Eyes: . Pupils are equal, round, and reactive to light. Conjunctivae and EOM are normal Nose: without d/c or deformity Neck: Neck supple. Gross normal ROM Cardiovascular: Normal rate and regular rhythm.   Pulmonary/Chest: Effort normal and breath sounds without rales or wheezing.  Abd:  Soft, NT, ND, + BS, no organomegaly Neurological: Pt is alert. At baseline orientation, motor grossly intact Skin: Skin is warm. No rashes, other new lesions, no LE edema Psychiatric: Pt behavior is normal without agitation  All otherwise neg per pt  Lab Results  Component Value Date   WBC 5.0 10/08/2019   HGB 12.4 (L) 10/08/2019   HCT 37.3 (L) 10/08/2019   PLT 135.0 (L) 10/08/2019   GLUCOSE 184 (H) 10/08/2019   CHOL 139 10/08/2019   TRIG 59.0 10/08/2019   HDL 51.20 10/08/2019   LDLCALC 76 10/08/2019   ALT 14 10/08/2019   AST 12 10/08/2019   NA 140 10/08/2019   K 3.8 10/08/2019   CL 104 10/08/2019   CREATININE 1.10 10/08/2019   BUN 18 10/08/2019   CO2 32 10/08/2019   TSH 3.85 10/08/2019   PSA 0.00 Repeated and verified X2. (L) 04/07/2019   INR 1.0 02/26/2007   HGBA1C 8.4 (H) 10/08/2019   MICROALBUR 2.1 (H) 10/08/2019      Assessment & Plan:

## 2019-10-09 ENCOUNTER — Other Ambulatory Visit: Payer: Self-pay | Admitting: Internal Medicine

## 2019-10-09 LAB — VITAMIN D 25 HYDROXY (VIT D DEFICIENCY, FRACTURES): VITD: 41.99 ng/mL (ref 30.00–100.00)

## 2019-10-09 MED ORDER — METFORMIN HCL ER 500 MG PO TB24: 500.0000 mg | ORAL_TABLET | Freq: Every day | ORAL | 3 refills | Status: DC

## 2019-10-11 ENCOUNTER — Encounter: Payer: Self-pay | Admitting: Internal Medicine

## 2019-10-11 NOTE — Assessment & Plan Note (Signed)

## 2019-10-11 NOTE — Assessment & Plan Note (Signed)
stable overall by history and exam, recent data reviewed with pt, and pt to continue medical treatment as before,  to f/u any worsening symptoms or concerns  

## 2019-10-11 NOTE — Assessment & Plan Note (Signed)
Cont oral replacement 

## 2019-10-11 NOTE — Assessment & Plan Note (Signed)
[place] for decreased xanax 0.25 mg prn

## 2019-10-19 ENCOUNTER — Other Ambulatory Visit: Payer: Self-pay | Admitting: Internal Medicine

## 2019-11-02 ENCOUNTER — Telehealth: Payer: Self-pay

## 2019-11-02 NOTE — Telephone Encounter (Signed)
Roger Hanson reports pt is behaving lethargic and sleeping a lot. Legrand Hanson states he thinks pt may be dehydrated, giving water to hydrate pt but pt remains lethargic.  Please advise if should be admitted to hospital for IV hydration fluids. States just gave pt boiled egg and pop tart. Legrand Hanson [phone]. Says does not want to take pt to hospital if possible.

## 2019-11-02 NOTE — Telephone Encounter (Signed)
Yes, this is called Altered Mental Status and could from any number of reasons, only one of which is dehydration, or could be other things such as infection (even without fever).  Pt needs to go ASAP to ED for full evaluation, and may indeed require hospn, I could not tell by the information in an email

## 2019-11-03 NOTE — Telephone Encounter (Signed)
Jane Canary and he stated that he just spoke to someone in the office and was given the message.

## 2019-11-03 NOTE — Telephone Encounter (Signed)
Called and spoke with son today.He will continue to monitor his dad. He has been keeping him hydrated and able to get food in him. Son states at this time he was hesitant to take his dad to ED with all the things going on especially with COVID. He has agreed to take his dad if he gets worse.

## 2019-11-20 ENCOUNTER — Telehealth: Payer: Self-pay | Admitting: Internal Medicine

## 2019-11-20 NOTE — Telephone Encounter (Signed)
[place] for otc melatonin 2 mg even up to 10 mg at night but may need ROV if not working  Pt should go to ED for any worsening behavioral issue such sudden worsening confusion or even hallucinations, paranoia, or agitation.

## 2019-11-20 NOTE — Telephone Encounter (Signed)
New message:    Pt's son is calling and states the patient's dementia is getting worse. He states the patient has not slept in 2 days. He states he is just unsure on what to do. Please advise.

## 2019-11-20 NOTE — Telephone Encounter (Signed)
Contacted [facility] and informed of PCP recommendation. Pt son stated that they do have a plan in place to keep pt safe and keep him from leaving at 3 in the morning.

## 2019-11-21 ENCOUNTER — Other Ambulatory Visit: Payer: Self-pay | Admitting: Internal Medicine

## 2019-11-21 NOTE — Telephone Encounter (Signed)
Please refill as per office routine med refill policy (all routine meds refilled for 3 mo or monthly per pt preference up to one year from last visit, then month to month grace period for 3 mo, then further med refills will have to be denied)  

## 2019-11-24 ENCOUNTER — Ambulatory Visit: Payer: Medicare HMO | Admitting: Internal Medicine

## 2019-11-30 ENCOUNTER — Other Ambulatory Visit: Payer: Self-pay | Admitting: Internal Medicine

## 2019-11-30 MED ORDER — ACCU-CHEK AVIVA PLUS W/DEVICE KIT: 1.0000 | PACK | Freq: Every day | 0 refills | Status: AC

## 2019-11-30 MED ORDER — ACCU-CHEK SOFTCLIX LANCETS MISC: 12 refills | Status: AC

## 2019-11-30 MED ORDER — ACCU-CHEK AVIVA VI SOLN: 1.0000 | Freq: Every day | 3 refills | Status: AC

## 2019-11-30 MED ORDER — ACCU-CHEK AVIVA PLUS VI STRP: ORAL_STRIP | 11 refills | Status: AC

## 2019-11-30 MED ORDER — BD SWAB SINGLE USE REGULAR PADS: 1.0000 | MEDICATED_PAD | Freq: Every day | 3 refills | Status: AC

## 2019-12-01 ENCOUNTER — Ambulatory Visit: Payer: Medicare HMO | Admitting: Internal Medicine

## 2019-12-08 ENCOUNTER — Ambulatory Visit: Payer: Medicare HMO | Admitting: Internal Medicine

## 2019-12-10 ENCOUNTER — Other Ambulatory Visit: Payer: Self-pay

## 2019-12-10 ENCOUNTER — Ambulatory Visit (INDEPENDENT_AMBULATORY_CARE_PROVIDER_SITE_OTHER): Payer: Medicare HMO | Admitting: Internal Medicine

## 2019-12-10 ENCOUNTER — Encounter: Payer: Self-pay | Admitting: Internal Medicine

## 2019-12-10 VITALS — BP 142/60 | HR 64 | Temp 98.0°F | Ht 68.0 in | Wt 141.4 lb

## 2019-12-10 DIAGNOSIS — E119 Type 2 diabetes mellitus without complications: Secondary | ICD-10-CM

## 2019-12-10 DIAGNOSIS — R600 Localized edema: Secondary | ICD-10-CM | POA: Insufficient documentation

## 2019-12-10 DIAGNOSIS — R609 Edema, unspecified: Secondary | ICD-10-CM | POA: Diagnosis not present

## 2019-12-10 DIAGNOSIS — F0390 Unspecified dementia without behavioral disturbance: Secondary | ICD-10-CM | POA: Diagnosis not present

## 2019-12-10 DIAGNOSIS — N183 Chronic kidney disease, stage 3 unspecified: Secondary | ICD-10-CM

## 2019-12-10 DIAGNOSIS — I10 Essential (primary) hypertension: Secondary | ICD-10-CM

## 2019-12-10 MED ORDER — DONEPEZIL HCL 5 MG PO TABS: 5.0000 mg | ORAL_TABLET | Freq: Every day | ORAL | 3 refills | Status: DC

## 2019-12-10 MED ORDER — FUROSEMIDE 20 MG PO TABS: 20.0000 mg | ORAL_TABLET | Freq: Every day | ORAL | 1 refills | Status: DC | PRN

## 2019-12-10 NOTE — Patient Instructions (Signed)
Please take all new medication as prescribed - the lasix 20 mg as needed for persistent swelling  Please take all new medication as prescribed - the lower dose aricept 5 mg for memory  Please continue all other medications as before, and refills have been done if requested.  Please have the pharmacy call with any other refills you may need.  Please continue your efforts at being more active, low cholesterol diet, and weight control.  Please keep your appointments with your specialists as you may have planned

## 2019-12-10 NOTE — Progress Notes (Signed)
Subjective:    Patient ID: Roger Hanson, male    DOB: September 20, 1934, 85 y.o.   MRN: [id number]  HPI  Here to f/u with son; overall doing ok,  Pt denies chest pain, increasing sob or doe, wheezing, orthopnea, PND, palpitations, dizziness or syncope, though has had increased le edema as son admits pushing him to drink plenty more of fluids recently.  Pt denies new neurological symptoms such as new headache, or facial or extremity weakness or numbness.  Pt denies polydipsia, polyuria, or low sugar episode.  Pt states overall good compliance with meds, Dementia overall stable symptomatically, and not assoc with behavioral changes such as hallucinations, paranoia, or agitation, but son asking for start med tx, now willing to try.   Pt denies fever, wt loss, night sweats, loss of appetite, or other constitutional symptoms Past Medical History:  Diagnosis Date  . Anxiety   . Arthritis of knee, degenerative 04/17/2019  . Chronic gastritis 04/17/2019  . Chronic insomnia 04/17/2019  . CKD (chronic kidney disease) stage 3, GFR 30-59 ml/min 04/17/2019  . DDD (degenerative disc disease), lumbosacral   . Diabetes mellitus without complication ([place])   . Diverticulosis   . GERD (gastroesophageal reflux disease)   . Hearing loss   . HLD (hyperlipidemia) 04/17/2019  . Hypercholesteremia   . Hypertension   . Hypothyroidism   . Insomnia   . Iron deficiency anemia 04/17/2019  . Lumbar disc disease 04/17/2019  . Lumbar radiculopathy   . Peripheral neuropathy 04/17/2019  . Prostate cancer ([place])   . Psoriasis   . PUD (peptic ulcer disease)   . Pulmonary nodules   . Restless legs syndrome (RLS)   . RLS (restless legs syndrome) 04/17/2019  . Vitamin D deficiency 04/17/2019  . Vitamin D deficiency disease    Past Surgical History:  Procedure Laterality Date  . ANKLE SURGERY  2005  . ARTHROSCOPY KNEE W/ DRILLING Right 2007  . CARPAL TUNNEL RELEASE Bilateral 1999  . HEMORROIDECTOMY  1980  . LUMBAR FUSION  2013    . PROSTATECTOMY  2005  . SQUAMOUS CELL CARCINOMA EXCISION  2008   from scrotum    reports that he has never smoked. He has never used smokeless tobacco. He reports that he does not drink alcohol or use drugs. family history includes Breast cancer in his mother; CVA in his mother; Diabetes in his brother and father; Hypertension in his brother. Allergies  Allergen Reactions  . Glipizide Other (See Comments)    Erythema multiforme  . Lovastatin     Erythema multiforme  . Omeprazole Nausea And Vomiting  . Protonix [Pantoprazole Sodium] Nausea Only   Current Outpatient Medications on File Prior to Visit  Medication Sig Dispense Refill  . Accu-Chek Softclix Lancets lancets Use as instructed once daily E11.9 100 each 12  . Alcohol Swabs (B-D SINGLE USE SWABS REGULAR) PADS Apply 1 Device topically daily. E11.9 100 each 3  . ALPRAZolam (XANAX) 0.25 MG tablet Take 1 tablet (0.25 mg total) by mouth daily as needed for anxiety. 30 tablet 5  . amLODipine (NORVASC) 10 MG tablet Take 1 tablet (10 mg total) by mouth daily. 30 tablet 0  . aspirin 81 MG tablet Take 81 mg by mouth daily.    . Blood Glucose Calibration (ACCU-CHEK AVIVA) SOLN 1 Device by In Vitro route daily. E11.9 1 each 3  . Blood Glucose Monitoring Suppl (ACCU-CHEK AVIVA PLUS) w/Device KIT Apply 1 Device topically daily. Once daily E11.9 1 kit 0  .  cholecalciferol (VITAMIN D) 1000 UNITS tablet Take 2,000 Units by mouth daily.    Marland Kitchen ESOMEPRAZOLE MAGNESIUM PO Take 22.3 mg by mouth every morning.     Marland Kitchen glimepiride (AMARYL) 4 MG tablet Take 4 mg by mouth 2 (two) times daily.     Marland Kitchen glucose blood (ACCU-CHEK AVIVA PLUS) test strip CHECK GLUCOSES ONCE DAILY E11.9 100 strip 11  . levothyroxine (SYNTHROID) 100 MCG tablet TAKE 1 TABLET BY MOUTH EVERYDAY AT BEDTIME 90 tablet 3  . Levothyroxine Sodium 100 MCG CAPS Take 100 mcg by mouth daily before breakfast.     . metFORMIN (GLUCOPHAGE-XR) 500 MG 24 hr tablet Take 1 tablet (500 mg total) by mouth  daily with breakfast. 90 tablet 3  . ondansetron (ZOFRAN) 4 MG tablet Take 1 tablet (4 mg total) by mouth 2 (two) times daily as needed for nausea or vomiting. 30 tablet 1  . pravastatin (PRAVACHOL) 80 MG tablet TAKE 1 TABLET BY MOUTH EVERY EVENING 90 tablet 1  . Suvorexant (BELSOMRA) 5 MG TABS Take 5 mg by mouth at bedtime as needed. 90 tablet 1  . TRADJENTA 5 MG TABS tablet Take 1 tablet (5 mg total) by mouth every morning. 30 tablet 11  . [DISCONTINUED] eszopiclone (LUNESTA) 2 MG TABS tablet Take 1 tablet (2 mg total) by mouth at bedtime as needed for sleep. Take immediately before bedtime 30 tablet 2   No current facility-administered medications on file prior to visit.   Review of Systems All otherwise neg per pt     Objective:   Physical Exam BP (!) 142/60   Pulse 64   Temp 98 F (36.7 C)   Ht _0  (1.727 m)   Wt 141 lb 6.4 oz (64.1 kg)   SpO2 99%   BMI 21.50 kg/m  VS noted,  Constitutional: Pt appears in NAD HENT: Head: NCAT.  Right Ear: External ear normal.  Left Ear: External ear normal.  Eyes: . Pupils are equal, round, and reactive to light. Conjunctivae and EOM are normal Nose: without d/c or deformity Neck: Neck supple. Gross normal ROM Cardiovascular: Normal rate and regular rhythm.   Pulmonary/Chest: Effort normal and breath sounds without rales or wheezing.  Abd:  Soft, NT, ND, + BS, no organomegaly Neurological: Pt is alert. At baseline orientation, motor grossly intact Skin: Skin is warm. No rashes, other new lesions, 1+ bilateral LE edema Psychiatric: Pt behavior is normal without agitation  All otherwise neg per pt Lab Results  Component Value Date   WBC 5.0 10/08/2019   HGB 12.4 (L) 10/08/2019   HCT 37.3 (L) 10/08/2019   PLT 135.0 (L) 10/08/2019   GLUCOSE 184 (H) 10/08/2019   CHOL 139 10/08/2019   TRIG 59.0 10/08/2019   HDL 51.20 10/08/2019   LDLCALC 76 10/08/2019   ALT 14 10/08/2019   AST 12 10/08/2019   NA 140 10/08/2019   K 3.8 10/08/2019     CL 104 10/08/2019   CREATININE 1.10 10/08/2019   BUN 18 10/08/2019   CO2 32 10/08/2019   TSH 3.85 10/08/2019   PSA 0.00 Repeated and verified X2. (L) 04/07/2019   INR 1.0 02/26/2007   HGBA1C 8.4 (H) 10/08/2019   MICROALBUR 2.1 (H) 10/08/2019      Assessment & Plan:

## 2019-12-13 ENCOUNTER — Encounter: Payer: Self-pay | Admitting: Internal Medicine

## 2019-12-13 NOTE — Assessment & Plan Note (Signed)
To start aricept 5 qd

## 2019-12-13 NOTE — Assessment & Plan Note (Addendum)
likey venous insufficiency, for lasix 20 qd prn, decrease excess oral fluids, low salt, leg elevation, compression stockings  I spent 31 minutes in preparing to see the patient by review of recent labs, imaging and procedures, obtaining and reviewing separately obtained history, communicating with the patient and family or caregiver, ordering medications, tests or procedures, and documenting clinical information in the EHR including the differential Dx, treatment, and any further evaluation and other management of edema, dementia, hTN< DM, cKD

## 2019-12-13 NOTE — Assessment & Plan Note (Signed)
stable overall by history and exam, recent data reviewed with pt, and pt to continue medical treatment as before,  to f/u any worsening symptoms or concerns  

## 2019-12-23 ENCOUNTER — Telehealth: Payer: Self-pay | Admitting: Internal Medicine

## 2019-12-23 MED ORDER — CITALOPRAM HYDROBROMIDE 10 MG PO TABS: 10.0000 mg | ORAL_TABLET | Freq: Every day | ORAL | 3 refills | Status: DC

## 2019-12-23 NOTE — Telephone Encounter (Signed)
Ok for celexa 10 qd - done erx

## 2019-12-23 NOTE — Telephone Encounter (Signed)
    Patient's son calling to report a conversation he had with the patient regarding depression. Patient admitted to the son he feels  depressed. The son states the patient may not sleep for 2 days, then will sleep a day and return to restless pattern. Feels medication is not helping.   Please advise

## 2019-12-24 ENCOUNTER — Ambulatory Visit: Payer: Medicare HMO | Admitting: Internal Medicine

## 2019-12-24 ENCOUNTER — Telehealth: Payer: Self-pay

## 2019-12-24 NOTE — Telephone Encounter (Signed)
Ok to take both this time

## 2019-12-24 NOTE — Telephone Encounter (Signed)
New message   Drug interaction citalopram (CELEXA) 10 MG tablet with the drug  donepezil (ARICEPT) 5 MG tablet  Please advise.

## 2019-12-25 NOTE — Telephone Encounter (Signed)
Pharmacy called to advised of below message again. Advised that at this time Dr Jenny Reichmann said that it was ok per note below.

## 2020-01-07 ENCOUNTER — Encounter: Payer: Self-pay | Admitting: Internal Medicine

## 2020-01-07 ENCOUNTER — Other Ambulatory Visit: Payer: Self-pay

## 2020-01-07 ENCOUNTER — Ambulatory Visit (INDEPENDENT_AMBULATORY_CARE_PROVIDER_SITE_OTHER): Payer: Medicare HMO | Admitting: Internal Medicine

## 2020-01-07 VITALS — BP 140/72 | HR 85 | Temp 98.3°F | Ht 68.0 in | Wt 145.0 lb

## 2020-01-07 DIAGNOSIS — I10 Essential (primary) hypertension: Secondary | ICD-10-CM | POA: Diagnosis not present

## 2020-01-07 DIAGNOSIS — F0390 Unspecified dementia without behavioral disturbance: Secondary | ICD-10-CM | POA: Diagnosis not present

## 2020-01-07 DIAGNOSIS — R609 Edema, unspecified: Secondary | ICD-10-CM

## 2020-01-07 DIAGNOSIS — E119 Type 2 diabetes mellitus without complications: Secondary | ICD-10-CM

## 2020-01-07 DIAGNOSIS — R600 Localized edema: Secondary | ICD-10-CM

## 2020-01-07 LAB — BASIC METABOLIC PANEL
BUN: 19 mg/dL (ref 6–23)
CO2: 29 mEq/L (ref 19–32)
Calcium: 9.2 mg/dL (ref 8.4–10.5)
Chloride: 105 mEq/L (ref 96–112)
Creatinine, Ser: 1.09 mg/dL (ref 0.40–1.50)
GFR: 64.63 mL/min (ref >60.00)
Glucose, Bld: 156 mg/dL — ABNORMAL HIGH (ref 70–99)
Potassium: 4.2 mEq/L (ref 3.5–5.1)
Sodium: 140 mEq/L (ref 135–145)

## 2020-01-07 LAB — HEMOGLOBIN A1C: Hgb A1c MFr Bld: 8 % — ABNORMAL HIGH (ref 4.6–6.5)

## 2020-01-07 NOTE — Patient Instructions (Addendum)
You can go online to [email or web address] to schedule your vaccination appointment  Please continue all other medications as before, and refills have been done if requested.  Please have the pharmacy call with any other refills you may need.  Please continue your efforts at being more active, low cholesterol diet, and weight control..  Please keep your appointments with your specialists as you may have planned  Please go to the LAB at the blood drawing area for the tests to be done  You will be contacted by phone if any changes need to be made immediately.  Otherwise, you will receive a letter about your results with an explanation, but please check with MyChart first.  Please remember to sign up for MyChart if you have not done so, as this will be important to you in the future with finding out test results, communicating by private email, and scheduling acute appointments online when needed.  Please make an Appointment to return in 6 months, or sooner if needed

## 2020-01-07 NOTE — Progress Notes (Signed)
Subjective:    Patient ID: Roger Hanson, male    DOB: 10-Apr-1935, 85 y.o.   MRN: [id number]  HPI  Here to f/u; overall doing ok,  Pt denies chest pain, increasing sob or doe, wheezing, orthopnea, PND, increased LE swelling, palpitations, dizziness or syncope.  Pt denies new neurological symptoms such as new headache, or facial or extremity weakness or numbness.  Pt denies polydipsia, polyuria, or low sugar episode.  Pt states overall good compliance with meds, mostly trying to follow appropriate diet, with wt overall stable,  LE swelling much improved now trace only to LLE  Son with him states pt depression much improved.   Pt denies fever, wt loss, night sweats, loss of appetite, or other constitutional symptoms Past Medical History:  Diagnosis Date  . Anxiety   . Arthritis of knee, degenerative 04/17/2019  . Chronic gastritis 04/17/2019  . Chronic insomnia 04/17/2019  . CKD (chronic kidney disease) stage 3, GFR 30-59 ml/min 04/17/2019  . DDD (degenerative disc disease), lumbosacral   . Diabetes mellitus without complication ([place])   . Diverticulosis   . GERD (gastroesophageal reflux disease)   . Hearing loss   . HLD (hyperlipidemia) 04/17/2019  . Hypercholesteremia   . Hypertension   . Hypothyroidism   . Insomnia   . Iron deficiency anemia 04/17/2019  . Lumbar disc disease 04/17/2019  . Lumbar radiculopathy   . Peripheral neuropathy 04/17/2019  . Prostate cancer ([place])   . Psoriasis   . PUD (peptic ulcer disease)   . Pulmonary nodules   . Restless legs syndrome (RLS)   . RLS (restless legs syndrome) 04/17/2019  . Vitamin D deficiency 04/17/2019  . Vitamin D deficiency disease    Past Surgical History:  Procedure Laterality Date  . ANKLE SURGERY  2005  . ARTHROSCOPY KNEE W/ DRILLING Right 2007  . CARPAL TUNNEL RELEASE Bilateral 1999  . HEMORROIDECTOMY  1980  . LUMBAR FUSION  2013  . PROSTATECTOMY  2005  . SQUAMOUS CELL CARCINOMA EXCISION  2008   from scrotum    reports that he  has never smoked. He has never used smokeless tobacco. He reports that he does not drink alcohol or use drugs. family history includes Breast cancer in his mother; CVA in his mother; Diabetes in his brother and father; Hypertension in his brother. Allergies  Allergen Reactions  . Glipizide Other (See Comments)    Erythema multiforme  . Lovastatin     Erythema multiforme  . Omeprazole Nausea And Vomiting  . Protonix [Pantoprazole Sodium] Nausea Only   Current Outpatient Medications on File Prior to Visit  Medication Sig Dispense Refill  . Accu-Chek Softclix Lancets lancets Use as instructed once daily E11.9 100 each 12  . Alcohol Swabs (B-D SINGLE USE SWABS REGULAR) PADS Apply 1 Device topically daily. E11.9 100 each 3  . ALPRAZolam (XANAX) 0.25 MG tablet Take 1 tablet (0.25 mg total) by mouth daily as needed for anxiety. 30 tablet 5  . amLODipine (NORVASC) 10 MG tablet Take 1 tablet (10 mg total) by mouth daily. 30 tablet 0  . Blood Glucose Calibration (ACCU-CHEK AVIVA) SOLN 1 Device by In Vitro route daily. E11.9 1 each 3  . Blood Glucose Monitoring Suppl (ACCU-CHEK AVIVA PLUS) w/Device KIT Apply 1 Device topically daily. Once daily E11.9 1 kit 0  . cholecalciferol (VITAMIN D) 1000 UNITS tablet Take 2,000 Units by mouth daily.    . citalopram (CELEXA) 10 MG tablet Take 1 tablet (10 mg total) by mouth daily.  90 tablet 3  . donepezil (ARICEPT) 5 MG tablet Take 1 tablet (5 mg total) by mouth at bedtime. 90 tablet 3  . furosemide (LASIX) 20 MG tablet Take 1 tablet (20 mg total) by mouth daily as needed. 90 tablet 1  . glucose blood (ACCU-CHEK AVIVA PLUS) test strip CHECK GLUCOSES ONCE DAILY E11.9 100 strip 11  . levothyroxine (SYNTHROID) 100 MCG tablet TAKE 1 TABLET BY MOUTH EVERYDAY AT BEDTIME 90 tablet 3  . ondansetron (ZOFRAN) 4 MG tablet Take 1 tablet (4 mg total) by mouth 2 (two) times daily as needed for nausea or vomiting. 30 tablet 1  . pravastatin (PRAVACHOL) 80 MG tablet TAKE 1  TABLET BY MOUTH EVERY EVENING 90 tablet 1  . Suvorexant (BELSOMRA) 5 MG TABS Take 5 mg by mouth at bedtime as needed. 90 tablet 1  . TRADJENTA 5 MG TABS tablet Take 1 tablet (5 mg total) by mouth every morning. 30 tablet 11  . ESOMEPRAZOLE MAGNESIUM PO Take 22.3 mg by mouth every morning.     . [DISCONTINUED] eszopiclone (LUNESTA) 2 MG TABS tablet Take 1 tablet (2 mg total) by mouth at bedtime as needed for sleep. Take immediately before bedtime 30 tablet 2   No current facility-administered medications on file prior to visit.   Review of Systems All otherwise neg per pt     Objective:   Physical Exam BP 140/72 (BP Location: Left Arm, Patient Position: Sitting, Cuff Size: Small)   Pulse 85   Temp 98.3 F (36.8 C) (Oral)   Ht 5' 8"  (1.727 m)   Wt 145 lb (65.8 kg)   SpO2 99%   BMI 22.05 kg/m  VS noted,  Constitutional: Pt appears in NAD HENT: Head: NCAT.  Right Ear: External ear normal.  Left Ear: External ear normal.  Eyes: . Pupils are equal, round, and reactive to light. Conjunctivae and EOM are normal Nose: without d/c or deformity Neck: Neck supple. Gross normal ROM Cardiovascular: Normal rate and regular rhythm.   Pulmonary/Chest: Effort normal and breath sounds without rales or wheezing.  Neurological: Pt is alert. At baseline orientation, motor grossly intact Skin: Skin is warm. No rashes, other new lesions, no LE edema Psychiatric: Pt behavior is normal without agitation  All otherwise neg per pt Lab Results  Component Value Date   WBC 5.0 10/08/2019   HGB 12.4 (L) 10/08/2019   HCT 37.3 (L) 10/08/2019   PLT 135.0 (L) 10/08/2019   GLUCOSE 156 (H) 01/07/2020   CHOL 139 10/08/2019   TRIG 59.0 10/08/2019   HDL 51.20 10/08/2019   LDLCALC 76 10/08/2019   ALT 14 10/08/2019   AST 12 10/08/2019   NA 140 01/07/2020   K 4.2 01/07/2020   CL 105 01/07/2020   CREATININE 1.09 01/07/2020   BUN 19 01/07/2020   CO2 29 01/07/2020   TSH 3.85 10/08/2019   PSA 0.00 Repeated  and verified X2. (L) 04/07/2019   INR 1.0 02/26/2007   HGBA1C 8.0 (H) 01/07/2020   MICROALBUR 2.1 (H) 10/08/2019      Assessment & Plan:

## 2020-01-08 ENCOUNTER — Other Ambulatory Visit: Payer: Self-pay | Admitting: Internal Medicine

## 2020-01-08 MED ORDER — PIOGLITAZONE HCL 15 MG PO TABS: 15.0000 mg | ORAL_TABLET | Freq: Every day | ORAL | 3 refills | Status: DC

## 2020-01-10 ENCOUNTER — Encounter: Payer: Self-pay | Admitting: Internal Medicine

## 2020-01-10 NOTE — Assessment & Plan Note (Signed)
stable overall by history and exam, recent data reviewed with pt, and pt to continue medical treatment as before,  to f/u any worsening symptoms or concerns  

## 2020-01-10 NOTE — Assessment & Plan Note (Addendum)
Now trace only llE, to cont lasix 20 qd prn, for bmP today  I spent 32 minutes in preparing to see the patient by review of recent labs, imaging and procedures, obtaining and reviewing separately obtained history, communicating with the patient and family or caregiver, ordering medications, tests or procedures, and documenting clinical information in the EHR including the differential Dx, treatment, and any further evaluation and other management of edema, depression, dementia, dM

## 2020-01-11 ENCOUNTER — Telehealth: Payer: Self-pay | Admitting: Internal Medicine

## 2020-01-11 NOTE — Telephone Encounter (Signed)
    Please return call to son to discuss lab results

## 2020-01-11 NOTE — Telephone Encounter (Signed)
Spoke with pt son and discussed the labs. Pt son was informed of med rx and will be picking new med up tomorrow 01/11/2020.

## 2020-02-02 ENCOUNTER — Ambulatory Visit: Payer: Medicare HMO | Admitting: Internal Medicine

## 2020-02-04 ENCOUNTER — Encounter: Payer: Self-pay | Admitting: Internal Medicine

## 2020-02-04 ENCOUNTER — Ambulatory Visit (INDEPENDENT_AMBULATORY_CARE_PROVIDER_SITE_OTHER): Payer: Medicare HMO | Admitting: Internal Medicine

## 2020-02-04 ENCOUNTER — Other Ambulatory Visit: Payer: Self-pay

## 2020-02-04 VITALS — BP 130/76 | HR 68 | Temp 99.0°F | Ht 68.0 in | Wt 133.0 lb

## 2020-02-04 DIAGNOSIS — F0391 Unspecified dementia with behavioral disturbance: Secondary | ICD-10-CM

## 2020-02-04 DIAGNOSIS — E538 Deficiency of other specified B group vitamins: Secondary | ICD-10-CM

## 2020-02-04 DIAGNOSIS — E119 Type 2 diabetes mellitus without complications: Secondary | ICD-10-CM | POA: Diagnosis not present

## 2020-02-04 DIAGNOSIS — I10 Essential (primary) hypertension: Secondary | ICD-10-CM

## 2020-02-04 DIAGNOSIS — N183 Chronic kidney disease, stage 3 unspecified: Secondary | ICD-10-CM

## 2020-02-04 MED ORDER — DONEPEZIL HCL 10 MG PO TABS: 10.0000 mg | ORAL_TABLET | Freq: Every day | ORAL | 3 refills | Status: DC

## 2020-02-04 MED ORDER — QUETIAPINE FUMARATE 25 MG PO TABS: 25.0000 mg | ORAL_TABLET | Freq: Two times a day (BID) | ORAL | 5 refills | Status: DC

## 2020-02-04 NOTE — Patient Instructions (Signed)
Ok for increased aricept 10 mg per day  Please take all new medication as prescribed - the seroquel 25 mg twice per day  You will be contacted regarding the referral for: [facility]  Please continue all other medications as before, and refills have been done if requested.  Please have the pharmacy call with any other refills you may need.  Please keep your appointments with your specialists as you may have planned  Please make an Appointment to return in 3 months, or sooner if needed

## 2020-02-04 NOTE — Progress Notes (Addendum)
   Subjective:    Patient ID: Roger Hanson, male    DOB: June 08, 1935, 85 y.o.   MRN: [id number]  HPI    Here to f/u; overall doing ok,  Pt denies chest pain, increasing sob or doe, wheezing, orthopnea, PND, increased LE swelling, palpitations, dizziness or syncope.  Pt denies new neurological symptoms such as new headache, or facial or extremity weakness or numbness.  Pt denies polydipsia, polyuria, or low sugar episode.  Pt states overall good compliance with meds, mostly trying to follow appropriate diet, with wt overall stable,  but little exercise however. Dementia overall stable symptomatically, and not assoc with behavioral changes such as hallucinations, but has had increased paranoia and mild unusual agitation, without abuse or violent behavior.. Wt Readings from Last 3 Encounters:  02/04/20 133 lb (60.3 kg)  01/07/20 145 lb (65.8 kg)  12/10/19 141 lb 6.4 oz (64.1 kg)     Review of Systems     Objective:   Physical Exam   Lab Results  Component Value Date   WBC 5.0 10/08/2019   HGB 12.4 (L) 10/08/2019   HCT 37.3 (L) 10/08/2019   PLT 135.0 (L) 10/08/2019   GLUCOSE 156 (H) 01/07/2020   CHOL 139 10/08/2019   TRIG 59.0 10/08/2019   HDL 51.20 10/08/2019   LDLCALC 76 10/08/2019   ALT 14 10/08/2019   AST 12 10/08/2019   NA 140 01/07/2020   K 4.2 01/07/2020   CL 105 01/07/2020   CREATININE 1.09 01/07/2020   BUN 19 01/07/2020   CO2 29 01/07/2020   TSH 3.85 10/08/2019   PSA 0.00 Repeated and verified X2. (L) 04/07/2019   INR 1.0 02/26/2007   HGBA1C 8.0 (H) 01/07/2020   MICROALBUR 2.1 (H) 10/08/2019      Assessment & Plan:

## 2020-02-05 ENCOUNTER — Other Ambulatory Visit: Payer: Self-pay

## 2020-02-05 NOTE — Patient Outreach (Signed)
[facility]) Care Management  02/05/2020  Roger Hanson 09/22/1934 [id number]  Referral Date: 02/05/20 Referral Source: MD referral Referral Reason: Dementia    Telephone call to patient/caregiver for referral received.  No answer.  HIPAA compliant voice message left.  Plan: RN CM will attempt patient again within 4 business days and send letter.  [name], RN, MSN [facility] Care Management Coordinator Direct Line [phone] Cell [phone] Toll Free: [phone]  Fax: [phone]

## 2020-02-05 NOTE — Patient Outreach (Signed)
[facility]) Care Management  [place]  02/05/2020   Roger Hanson 09/11/1935 [id number]  Subjective: Incoming call from son Roger Hanson who is primary caregiver for patient.  Discussed reason for referral.  He states that he is needing support for his dad due to his dementia and his diabetes. He is a Animator and sometimes gets agitated and son would like some support with his care.  Discussed [facility] services and support.  He is agreeable to nurse follow up for disease management.    Patient has history of HTN, DM, GERD, CKD, and dementia.  Son reports that his dad's A1c as 8.0 and his blood sugars had been high. He reports some changes in his diet and now his sugars are in the 170 range but knows he needs to work on that more.  Discussed diabetes control and diabetes preventive eye exam.  He will work on getting his dad an appointment with his eye doctor.  Lengthy discussion about dementia, bodily and mental changes.  Son appreciates the education and support provided.     Patient lives in the home with his son Roger Hanson who is his primary caregiver.  He also has a daughter who lives close that helps and transports them around.  Patient is independent activities of daily living but has some problems with incontinence.  Son Roger Hanson manages his medications and doctor's appointments.    Objective:   Encounter Medications:  Outpatient Encounter Medications as of 02/05/2020  Medication Sig  . Accu-Chek Softclix Lancets lancets Use as instructed once daily E11.9  . Alcohol Swabs (B-D SINGLE USE SWABS REGULAR) PADS Apply 1 Device topically daily. E11.9  . ALPRAZolam (XANAX) 0.25 MG tablet Take 1 tablet (0.25 mg total) by mouth daily as needed for anxiety.  . Blood Glucose Calibration (ACCU-CHEK AVIVA) SOLN 1 Device by In Vitro route daily. E11.9  . Blood Glucose Monitoring Suppl (ACCU-CHEK AVIVA PLUS) w/Device KIT Apply 1 Device topically daily. Once daily E11.9  .  cholecalciferol (VITAMIN D) 1000 UNITS tablet Take 2,000 Units by mouth daily.  . citalopram (CELEXA) 10 MG tablet Take 1 tablet (10 mg total) by mouth daily.  Marland Kitchen donepezil (ARICEPT) 10 MG tablet Take 1 tablet (10 mg total) by mouth at bedtime.  Marland Kitchen ESOMEPRAZOLE MAGNESIUM PO Take 22.3 mg by mouth every morning.   . furosemide (LASIX) 20 MG tablet Take 1 tablet (20 mg total) by mouth daily as needed.  Marland Kitchen glucose blood (ACCU-CHEK AVIVA PLUS) test strip CHECK GLUCOSES ONCE DAILY E11.9  . levothyroxine (SYNTHROID) 100 MCG tablet TAKE 1 TABLET BY MOUTH EVERYDAY AT BEDTIME  . ondansetron (ZOFRAN) 4 MG tablet Take 1 tablet (4 mg total) by mouth 2 (two) times daily as needed for nausea or vomiting.  . pioglitazone (ACTOS) 15 MG tablet Take 1 tablet (15 mg total) by mouth daily.  . pravastatin (PRAVACHOL) 80 MG tablet TAKE 1 TABLET BY MOUTH EVERY EVENING  . QUEtiapine (SEROQUEL) 25 MG tablet Take 1 tablet (25 mg total) by mouth 2 (two) times daily.  . Suvorexant (BELSOMRA) 5 MG TABS Take 5 mg by mouth at bedtime as needed.  . TRADJENTA 5 MG TABS tablet Take 1 tablet (5 mg total) by mouth every morning.  . [DISCONTINUED] eszopiclone (LUNESTA) 2 MG TABS tablet Take 1 tablet (2 mg total) by mouth at bedtime as needed for sleep. Take immediately before bedtime   No facility-administered encounter medications on file as of 02/05/2020.    Functional Status:  In  your present state of health, do you have any difficulty performing the following activities: 02/05/2020  Hearing? N  Vision? N  Difficulty concentrating or making decisions? Y  Comment dementia  Walking or climbing stairs? N  Dressing or bathing? N  Doing errands, shopping? Y  Comment has dementia  Preparing Food and eating ? Y  Comment son does  Using the Toilet? N  In the past six months, have you accidently leaked urine? Y  Comment at times  Do you have problems with loss of bowel control? N  Managing your Medications? Y  Comment son does   Managing your Finances? Y  Comment son does  Runner, broadcasting/film/video? Y  Comment son does  Some recent data might be hidden    Fall/Depression Screening: Fall Risk  02/05/2020 01/07/2020 12/10/2019  Falls in the past year? 1 1 0  Number falls in past yr: 0 0 0  Injury with Fall? 0 0 0  Risk for fall due to : Mental status change History of fall(s) -  Follow up Falls prevention discussed Falls evaluation completed -   PHQ 2/9 Scores 02/05/2020 10/08/2019 04/07/2019  PHQ - 2 Score 1 1 0   SDOH Screenings   Alcohol Screen:   . Last Alcohol Screening Score (AUDIT):   Depression (PHQ2-9): Low Risk   . PHQ-2 Score: 1  Financial Resource Strain:   . Difficulty of Paying Living Expenses:   Food Insecurity: No Food Insecurity  . Worried About Charity fundraiser in the Last Year: Never true  . Ran Out of Food in the Last Year: Never true  Housing: Low Risk   . Last Housing Risk Score: 0  Physical Activity:   . Days of Exercise per Week:   . Minutes of Exercise per Session:   Social Connections:   . Frequency of Communication with Friends and Family:   . Frequency of Social Gatherings with Friends and Family:   . Attends Religious Services:   . Active Member of Clubs or Organizations:   . Attends Archivist Meetings:   Marland Kitchen Marital Status:   Stress:   . Feeling of Stress :   Tobacco Use: Low Risk   . Smoking Tobacco Use: Never Smoker  . Smokeless Tobacco Use: Never Used  Transportation Needs: No Transportation Needs  . Lack of Transportation (Medical): No  . Lack of Transportation (Non-Medical): No    Assessment: Patient with chronic health issues.  Family seeking education and support with health issues and will benefit from disease management and support calls.   Plan:  [facility] CM Care Plan Problem One     Most Recent Value  Care Plan Problem One  Knowledge deficit diabetes  Role Documenting the Problem One  Care Management Telephonic Coordinator   Care Plan for Problem One  Active  [facility] Long Term Goal   Over the next 90 days, patient will demonstrate and/or verbalize understanding of self-health management for long term care of Diabetes.  [facility] Long Term Goal Start Date  02/05/20  Interventions for Problem One Long Term Goal  RN CM reviewed with patient sugar control, importance of monitoring sugars, and importance of diet and exercise.      RN CM will provide ongoing education and support to patient/caregiver through phone calls.   RN CM will send welcome packet with consent to patient/caregiver.   RN CM will send initial barriers letter, assessment, and care plan to primary care physician.   RN  CM will contact in the month of June and caregiver agrees to next contact.    [name], RN, MSN [place] [facility] Care Management Coordinator Direct Line [phone] Cell [phone] Toll Free: [phone]  Fax: [phone]

## 2020-02-06 ENCOUNTER — Encounter: Payer: Self-pay | Admitting: Internal Medicine

## 2020-02-06 NOTE — Assessment & Plan Note (Addendum)
With mild worsening, for increased aricept 10 qd, add seroquel 25 bid. Refer [facility]  I spent 31 minutes in preparing to see the patient by review of recent labs, imaging and procedures, obtaining and reviewing separately obtained history, communicating with the patient and family or caregiver, ordering medications, tests or procedures, and documenting clinical information in the EHR including the differential Dx, treatment, and any further evaluation and other management of dementia with behavioral disturbance, dm, hth, ckd, b12 deficiency

## 2020-02-06 NOTE — Assessment & Plan Note (Signed)
stable overall by history and exam, recent data reviewed with pt, and pt to continue medical treatment as before,  to f/u any worsening symptoms or concerns  

## 2020-02-06 NOTE — Assessment & Plan Note (Signed)
Ok to continue oral replacement

## 2020-02-11 ENCOUNTER — Other Ambulatory Visit: Payer: Self-pay

## 2020-02-11 NOTE — Patient Outreach (Signed)
[facility]) Care Management  02/11/2020  MIHRAN LEIBA 08-30-1935 [id number]   Incoming call from son [name].  He states that he was not sure if it was [facility] but he received a phone call about questionably his father's medication.  Reviewed Epic information unable to find anything. He states that he was just checking but however patient had been up for 2 days and that the doctor gave him Seroquel and increased his Aricept.  He states he started that Seroquel today and hope that it makes a difference. He states that patient could not find the toilet and had a BM on the floor a couple of days ago but states that patient finally is sleeping now after two days. He voices no concerns.    Plan: RN CM will contact in the month of June and son agreeable.   [name], RN, MSN [place] [facility] Care Management Coordinator Direct Line [phone] Cell [phone] Toll Free: [phone]  Fax: [phone]

## 2020-02-22 ENCOUNTER — Telehealth: Payer: Self-pay | Admitting: Internal Medicine

## 2020-02-22 NOTE — Telephone Encounter (Signed)
   The son [name] calling, wants to know if patient can increase Nexium for increased reflux

## 2020-02-23 MED ORDER — PANTOPRAZOLE SODIUM 40 MG PO TBEC: 40.0000 mg | DELAYED_RELEASE_TABLET | Freq: Every day | ORAL | 3 refills | Status: DC

## 2020-02-23 NOTE — Telephone Encounter (Signed)
Ok for change otc nexium to protonix 40 qd - done erx

## 2020-02-24 NOTE — Telephone Encounter (Signed)
    Patients son made aware Protonix called to pharmacy

## 2020-03-04 ENCOUNTER — Other Ambulatory Visit: Payer: Self-pay | Admitting: Internal Medicine

## 2020-03-04 NOTE — Telephone Encounter (Signed)
No need for refills since already has 5 more refills

## 2020-03-10 ENCOUNTER — Telehealth: Payer: Self-pay | Admitting: Internal Medicine

## 2020-03-10 ENCOUNTER — Other Ambulatory Visit: Payer: Self-pay

## 2020-03-10 NOTE — Patient Outreach (Signed)
[facility]) Care Management  [place]  03/10/2020   Roger Hanson 1934/12/07 [id number]  Subjective: Return call to son [name].  He states that patient has had some falls recently.  He states that he hurt his wrist with one fall. He states patient had increase in Aricept.  Discussed medications could be causing his falls or dementia could be worse. Patient has not ever seen a neurologist per son.   Discussed patient possibly seeing PCP for advice and if he feels patient needs to see neurology at this point.  He states he and his sister were just wondering about disease progression but will consult with PCP.  He states patient stays up for days at a time and then crashes for a day or so.  Advised that this can be common with some dementia patients.  He states patient only awakens for meals and medication during that time.  Discussed possible medication evaluation to ensure optimal medication management and this may need a neurology consult.  He verbalized understanding.  Patient sugars in the 130 range at this time with no concerns.    Objective:   Encounter Medications:  Outpatient Encounter Medications as of 03/10/2020  Medication Sig  . Accu-Chek Softclix Lancets lancets Use as instructed once daily E11.9  . Alcohol Swabs (B-D SINGLE USE SWABS REGULAR) PADS Apply 1 Device topically daily. E11.9  . ALPRAZolam (XANAX) 0.25 MG tablet Take 1 tablet (0.25 mg total) by mouth daily as needed for anxiety.  . Blood Glucose Calibration (ACCU-CHEK AVIVA) SOLN 1 Device by In Vitro route daily. E11.9  . Blood Glucose Monitoring Suppl (ACCU-CHEK AVIVA PLUS) w/Device KIT Apply 1 Device topically daily. Once daily E11.9  . cholecalciferol (VITAMIN D) 1000 UNITS tablet Take 2,000 Units by mouth daily.  . citalopram (CELEXA) 10 MG tablet Take 1 tablet (10 mg total) by mouth daily.  Marland Kitchen donepezil (ARICEPT) 10 MG tablet Take 1 tablet (10 mg total) by mouth at bedtime.  . furosemide  (LASIX) 20 MG tablet Take 1 tablet (20 mg total) by mouth daily as needed.  Marland Kitchen glucose blood (ACCU-CHEK AVIVA PLUS) test strip CHECK GLUCOSES ONCE DAILY E11.9  . levothyroxine (SYNTHROID) 100 MCG tablet TAKE 1 TABLET BY MOUTH EVERYDAY AT BEDTIME  . ondansetron (ZOFRAN) 4 MG tablet Take 1 tablet (4 mg total) by mouth 2 (two) times daily as needed for nausea or vomiting.  . pantoprazole (PROTONIX) 40 MG tablet Take 1 tablet (40 mg total) by mouth daily.  . pioglitazone (ACTOS) 15 MG tablet Take 1 tablet (15 mg total) by mouth daily.  . pravastatin (PRAVACHOL) 80 MG tablet TAKE 1 TABLET BY MOUTH EVERY EVENING  . QUEtiapine (SEROQUEL) 25 MG tablet Take 1 tablet (25 mg total) by mouth 2 (two) times daily.  . Suvorexant (BELSOMRA) 5 MG TABS Take 5 mg by mouth at bedtime as needed.  . TRADJENTA 5 MG TABS tablet Take 1 tablet (5 mg total) by mouth every morning.  . [DISCONTINUED] eszopiclone (LUNESTA) 2 MG TABS tablet Take 1 tablet (2 mg total) by mouth at bedtime as needed for sleep. Take immediately before bedtime   No facility-administered encounter medications on file as of 03/10/2020.    Functional Status:  In your present state of health, do you have any difficulty performing the following activities: 02/05/2020  Hearing? N  Vision? N  Difficulty concentrating or making decisions? Y  Comment dementia  Walking or climbing stairs? N  Dressing or bathing? N  Doing  errands, shopping? Y  Comment has dementia  Preparing Food and eating ? Y  Comment son does  Using the Toilet? N  In the past six months, have you accidently leaked urine? Y  Comment at times  Do you have problems with loss of bowel control? N  Managing your Medications? Y  Comment son does  Managing your Finances? Y  Comment son does  Runner, broadcasting/film/video? Y  Comment son does  Some recent data might be hidden    Fall/Depression Screening: Fall Risk  03/10/2020 02/05/2020 01/07/2020  Falls in the past  year? 1 1 1   Number falls in past yr: 1 0 0  Injury with Fall? 1 0 0  Risk for fall due to : Mental status change;Impaired balance/gait Mental status change History of fall(s)  Follow up Falls prevention discussed Falls prevention discussed Falls evaluation completed   PHQ 2/9 Scores 02/05/2020 10/08/2019 04/07/2019  PHQ - 2 Score 1 1 0    Assessment: Patient with dementia progression follow up with PCP advised. Patient currently cared for managed by son [name] daily with some assistance from daughter as well.  No concerns with diabetes at this time.   Plan:  [facility] CM Care Plan Problem One     Most Recent Value  Care Plan Problem One Knowledge deficit diabetes  Role Documenting the Problem One Care Management Telephonic Coordinator  Care Plan for Problem One Active  [facility] Long Term Goal  Over the next 90 days, patient will demonstrate and/or verbalize understanding of self-health management for long term care of Diabetes.  [facility] Long Term Goal Start Date 02/05/20  Interventions for Problem One Long Term Goal Patient blood sugars ranging 130's.  Patient eating well.  Discussed blood sugar control.    [facility] CM Short Term Goal #1  Patient will not experience any falls within the next 30 days  [facility] CM Short Term Goal #1 Start Date 03/10/20  Interventions for Short Term Goal #1 Patient with recent falls.  Patient has had increase in aricept which may explain falls.  Discussed falls precautions.       RN CM will contact in the month of July and son agreeable.    [name], RN, MSN [place] [facility] Care Management Coordinator Direct Line [phone] Cell [phone] Toll Free: [phone]  Fax: [phone]

## 2020-03-10 NOTE — Telephone Encounter (Signed)
    Roger Hanson calling to report patient has fallen several times in the last 2 weeks (no major injury, bruising only)  Requesting referral to Neurology

## 2020-03-11 ENCOUNTER — Ambulatory Visit: Payer: Self-pay

## 2020-03-15 ENCOUNTER — Ambulatory Visit: Payer: Medicare HMO | Admitting: Internal Medicine

## 2020-03-22 ENCOUNTER — Ambulatory Visit: Payer: Medicare HMO | Admitting: Internal Medicine

## 2020-03-24 ENCOUNTER — Ambulatory Visit: Payer: Medicare HMO | Admitting: Internal Medicine

## 2020-03-29 DIAGNOSIS — E538 Deficiency of other specified B group vitamins: Secondary | ICD-10-CM | POA: Diagnosis present

## 2020-03-29 DIAGNOSIS — I10 Essential (primary) hypertension: Secondary | ICD-10-CM | POA: Diagnosis not present

## 2020-03-29 DIAGNOSIS — K921 Melena: Principal | ICD-10-CM | POA: Diagnosis present

## 2020-03-29 DIAGNOSIS — L409 Psoriasis, unspecified: Secondary | ICD-10-CM | POA: Diagnosis present

## 2020-03-29 DIAGNOSIS — E785 Hyperlipidemia, unspecified: Secondary | ICD-10-CM | POA: Diagnosis present

## 2020-03-29 DIAGNOSIS — Z803 Family history of malignant neoplasm of breast: Secondary | ICD-10-CM

## 2020-03-29 DIAGNOSIS — N183 Chronic kidney disease, stage 3 unspecified: Secondary | ICD-10-CM | POA: Diagnosis present

## 2020-03-29 DIAGNOSIS — G2581 Restless legs syndrome: Secondary | ICD-10-CM | POA: Diagnosis present

## 2020-03-29 DIAGNOSIS — K279 Peptic ulcer, site unspecified, unspecified as acute or chronic, without hemorrhage or perforation: Secondary | ICD-10-CM | POA: Diagnosis present

## 2020-03-29 DIAGNOSIS — E039 Hypothyroidism, unspecified: Secondary | ICD-10-CM | POA: Diagnosis present

## 2020-03-29 DIAGNOSIS — Z20822 Contact with and (suspected) exposure to covid-19: Secondary | ICD-10-CM | POA: Diagnosis present

## 2020-03-29 DIAGNOSIS — E1142 Type 2 diabetes mellitus with diabetic polyneuropathy: Secondary | ICD-10-CM | POA: Diagnosis present

## 2020-03-29 DIAGNOSIS — Z823 Family history of stroke: Secondary | ICD-10-CM | POA: Diagnosis not present

## 2020-03-29 DIAGNOSIS — E7800 Pure hypercholesterolemia, unspecified: Secondary | ICD-10-CM | POA: Diagnosis present

## 2020-03-29 DIAGNOSIS — Z794 Long term (current) use of insulin: Secondary | ICD-10-CM

## 2020-03-29 DIAGNOSIS — K5790 Diverticulosis of intestine, part unspecified, without perforation or abscess without bleeding: Secondary | ICD-10-CM | POA: Diagnosis present

## 2020-03-29 DIAGNOSIS — Z66 Do not resuscitate: Secondary | ICD-10-CM | POA: Diagnosis not present

## 2020-03-29 DIAGNOSIS — Z515 Encounter for palliative care: Secondary | ICD-10-CM | POA: Diagnosis not present

## 2020-03-29 DIAGNOSIS — Z888 Allergy status to other drugs, medicaments and biological substances status: Secondary | ICD-10-CM

## 2020-03-29 DIAGNOSIS — E876 Hypokalemia: Secondary | ICD-10-CM | POA: Diagnosis present

## 2020-03-29 DIAGNOSIS — F419 Anxiety disorder, unspecified: Secondary | ICD-10-CM | POA: Diagnosis present

## 2020-03-29 DIAGNOSIS — I129 Hypertensive chronic kidney disease with stage 1 through stage 4 chronic kidney disease, or unspecified chronic kidney disease: Secondary | ICD-10-CM | POA: Diagnosis present

## 2020-03-29 DIAGNOSIS — K219 Gastro-esophageal reflux disease without esophagitis: Secondary | ICD-10-CM | POA: Diagnosis present

## 2020-03-29 DIAGNOSIS — H9190 Unspecified hearing loss, unspecified ear: Secondary | ICD-10-CM | POA: Diagnosis present

## 2020-03-29 DIAGNOSIS — N179 Acute kidney failure, unspecified: Secondary | ICD-10-CM | POA: Diagnosis present

## 2020-03-29 DIAGNOSIS — Z833 Family history of diabetes mellitus: Secondary | ICD-10-CM

## 2020-03-29 DIAGNOSIS — Z79899 Other long term (current) drug therapy: Secondary | ICD-10-CM

## 2020-03-29 DIAGNOSIS — E1122 Type 2 diabetes mellitus with diabetic chronic kidney disease: Secondary | ICD-10-CM | POA: Diagnosis present

## 2020-03-29 DIAGNOSIS — M5117 Intervertebral disc disorders with radiculopathy, lumbosacral region: Secondary | ICD-10-CM | POA: Diagnosis present

## 2020-03-29 DIAGNOSIS — Z981 Arthrodesis status: Secondary | ICD-10-CM

## 2020-03-29 DIAGNOSIS — D62 Acute posthemorrhagic anemia: Secondary | ICD-10-CM | POA: Diagnosis present

## 2020-03-29 DIAGNOSIS — Z79890 Hormone replacement therapy (postmenopausal): Secondary | ICD-10-CM

## 2020-03-29 DIAGNOSIS — Z7189 Other specified counseling: Secondary | ICD-10-CM | POA: Diagnosis not present

## 2020-03-29 DIAGNOSIS — K922 Gastrointestinal hemorrhage, unspecified: Secondary | ICD-10-CM | POA: Diagnosis not present

## 2020-03-29 DIAGNOSIS — F0391 Unspecified dementia with behavioral disturbance: Secondary | ICD-10-CM | POA: Diagnosis present

## 2020-03-29 DIAGNOSIS — Z8546 Personal history of malignant neoplasm of prostate: Secondary | ICD-10-CM

## 2020-03-29 DIAGNOSIS — Z8249 Family history of ischemic heart disease and other diseases of the circulatory system: Secondary | ICD-10-CM

## 2020-03-29 DIAGNOSIS — Z8711 Personal history of peptic ulcer disease: Secondary | ICD-10-CM

## 2020-03-29 DIAGNOSIS — I639 Cerebral infarction, unspecified: Secondary | ICD-10-CM | POA: Diagnosis not present

## 2020-03-29 DIAGNOSIS — D500 Iron deficiency anemia secondary to blood loss (chronic): Secondary | ICD-10-CM | POA: Diagnosis not present

## 2020-03-30 ENCOUNTER — Inpatient Hospital Stay (HOSPITAL_COMMUNITY)
Admission: EM | Admit: 2020-03-30 | Discharge: 2020-04-08 | DRG: 378 | Disposition: A | Discharge status: Hospice | Payer: Medicare HMO | Attending: Family Medicine | Admitting: Family Medicine

## 2020-03-30 ENCOUNTER — Other Ambulatory Visit: Payer: Self-pay

## 2020-03-30 ENCOUNTER — Encounter (HOSPITAL_COMMUNITY): Payer: Self-pay

## 2020-03-30 ENCOUNTER — Inpatient Hospital Stay (HOSPITAL_COMMUNITY): Payer: Medicare HMO

## 2020-03-30 ENCOUNTER — Ambulatory Visit: Payer: Medicare HMO | Admitting: Internal Medicine

## 2020-03-30 DIAGNOSIS — E039 Hypothyroidism, unspecified: Secondary | ICD-10-CM | POA: Diagnosis present

## 2020-03-30 DIAGNOSIS — G2581 Restless legs syndrome: Secondary | ICD-10-CM | POA: Diagnosis present

## 2020-03-30 DIAGNOSIS — I10 Essential (primary) hypertension: Secondary | ICD-10-CM | POA: Diagnosis present

## 2020-03-30 DIAGNOSIS — E876 Hypokalemia: Secondary | ICD-10-CM

## 2020-03-30 DIAGNOSIS — F419 Anxiety disorder, unspecified: Secondary | ICD-10-CM | POA: Diagnosis present

## 2020-03-30 DIAGNOSIS — D62 Acute posthemorrhagic anemia: Secondary | ICD-10-CM | POA: Diagnosis present

## 2020-03-30 DIAGNOSIS — E538 Deficiency of other specified B group vitamins: Secondary | ICD-10-CM | POA: Diagnosis present

## 2020-03-30 DIAGNOSIS — K5790 Diverticulosis of intestine, part unspecified, without perforation or abscess without bleeding: Secondary | ICD-10-CM | POA: Diagnosis present

## 2020-03-30 DIAGNOSIS — F03918 Unspecified dementia, unspecified severity, with other behavioral disturbance: Secondary | ICD-10-CM | POA: Diagnosis present

## 2020-03-30 DIAGNOSIS — E785 Hyperlipidemia, unspecified: Secondary | ICD-10-CM | POA: Diagnosis present

## 2020-03-30 DIAGNOSIS — Z66 Do not resuscitate: Secondary | ICD-10-CM | POA: Diagnosis not present

## 2020-03-30 DIAGNOSIS — Z20822 Contact with and (suspected) exposure to covid-19: Secondary | ICD-10-CM | POA: Diagnosis present

## 2020-03-30 DIAGNOSIS — K279 Peptic ulcer, site unspecified, unspecified as acute or chronic, without hemorrhage or perforation: Secondary | ICD-10-CM | POA: Diagnosis not present

## 2020-03-30 DIAGNOSIS — E7800 Pure hypercholesterolemia, unspecified: Secondary | ICD-10-CM | POA: Diagnosis present

## 2020-03-30 DIAGNOSIS — D500 Iron deficiency anemia secondary to blood loss (chronic): Secondary | ICD-10-CM

## 2020-03-30 DIAGNOSIS — N179 Acute kidney failure, unspecified: Secondary | ICD-10-CM | POA: Diagnosis present

## 2020-03-30 DIAGNOSIS — F0391 Unspecified dementia with behavioral disturbance: Secondary | ICD-10-CM | POA: Diagnosis present

## 2020-03-30 DIAGNOSIS — N183 Chronic kidney disease, stage 3 unspecified: Secondary | ICD-10-CM | POA: Diagnosis present

## 2020-03-30 DIAGNOSIS — E1122 Type 2 diabetes mellitus with diabetic chronic kidney disease: Secondary | ICD-10-CM | POA: Diagnosis present

## 2020-03-30 DIAGNOSIS — Z823 Family history of stroke: Secondary | ICD-10-CM | POA: Diagnosis not present

## 2020-03-30 DIAGNOSIS — K922 Gastrointestinal hemorrhage, unspecified: Secondary | ICD-10-CM | POA: Diagnosis not present

## 2020-03-30 DIAGNOSIS — Z7189 Other specified counseling: Secondary | ICD-10-CM | POA: Diagnosis not present

## 2020-03-30 DIAGNOSIS — L409 Psoriasis, unspecified: Secondary | ICD-10-CM | POA: Diagnosis present

## 2020-03-30 DIAGNOSIS — K219 Gastro-esophageal reflux disease without esophagitis: Secondary | ICD-10-CM | POA: Diagnosis present

## 2020-03-30 DIAGNOSIS — Z515 Encounter for palliative care: Secondary | ICD-10-CM

## 2020-03-30 DIAGNOSIS — I129 Hypertensive chronic kidney disease with stage 1 through stage 4 chronic kidney disease, or unspecified chronic kidney disease: Secondary | ICD-10-CM | POA: Diagnosis present

## 2020-03-30 DIAGNOSIS — M5117 Intervertebral disc disorders with radiculopathy, lumbosacral region: Secondary | ICD-10-CM | POA: Diagnosis present

## 2020-03-30 DIAGNOSIS — K921 Melena: Secondary | ICD-10-CM | POA: Diagnosis present

## 2020-03-30 DIAGNOSIS — E1142 Type 2 diabetes mellitus with diabetic polyneuropathy: Secondary | ICD-10-CM | POA: Diagnosis present

## 2020-03-30 DIAGNOSIS — H9190 Unspecified hearing loss, unspecified ear: Secondary | ICD-10-CM | POA: Diagnosis present

## 2020-03-30 LAB — HEMOGLOBIN AND HEMATOCRIT, BLOOD
HCT: 25.5 % — ABNORMAL LOW (ref 39.0–52.0)
HCT: 26.5 % — ABNORMAL LOW (ref 39.0–52.0)
Hemoglobin: 7.3 g/dL — ABNORMAL LOW (ref 13.0–17.0)
Hemoglobin: 7.6 g/dL — ABNORMAL LOW (ref 13.0–17.0)

## 2020-03-30 LAB — COMPREHENSIVE METABOLIC PANEL
ALT: 16 U/L (ref 0–44)
AST: 14 U/L — ABNORMAL LOW (ref 15–41)
Albumin: 3.5 g/dL (ref 3.5–5.0)
Alkaline Phosphatase: 67 U/L (ref 38–126)
Anion gap: 11 (ref 5–15)
BUN: 53 mg/dL — ABNORMAL HIGH (ref 8–23)
CO2: 29 mmol/L (ref 22–32)
Calcium: 8.7 mg/dL — ABNORMAL LOW (ref 8.9–10.3)
Chloride: 94 mmol/L — ABNORMAL LOW (ref 98–111)
Creatinine, Ser: 1.66 mg/dL — ABNORMAL HIGH (ref 0.61–1.24)
GFR calc Af Amer: 44 mL/min — ABNORMAL LOW (ref >60)
GFR calc non Af Amer: 38 mL/min — ABNORMAL LOW (ref >60)
Glucose, Bld: 139 mg/dL — ABNORMAL HIGH (ref 70–99)
Potassium: 2.7 mmol/L — CL (ref 3.5–5.1)
Sodium: 134 mmol/L — ABNORMAL LOW (ref 135–145)
Total Bilirubin: 0.7 mg/dL (ref 0.3–1.2)
Total Protein: 6.4 g/dL — ABNORMAL LOW (ref 6.5–8.1)

## 2020-03-30 LAB — CBC
HCT: 23.7 % — ABNORMAL LOW (ref 39.0–52.0)
Hemoglobin: 6.6 g/dL — CL (ref 13.0–17.0)
MCH: 20.4 pg — ABNORMAL LOW (ref 26.0–34.0)
MCHC: 27.8 g/dL — ABNORMAL LOW (ref 30.0–36.0)
MCV: 73.4 fL — ABNORMAL LOW (ref 80.0–100.0)
Platelets: 248 10*3/uL (ref 150–400)
RBC: 3.23 MIL/uL — ABNORMAL LOW (ref 4.22–5.81)
RDW: 22.5 % — ABNORMAL HIGH (ref 11.5–15.5)
WBC: 4.8 10*3/uL (ref 4.0–10.5)
nRBC: 0 % (ref 0.0–0.2)

## 2020-03-30 LAB — GLUCOSE, CAPILLARY
Glucose-Capillary: 102 mg/dL — ABNORMAL HIGH (ref 70–99)
Glucose-Capillary: 110 mg/dL — ABNORMAL HIGH (ref 70–99)
Glucose-Capillary: 117 mg/dL — ABNORMAL HIGH (ref 70–99)
Glucose-Capillary: 82 mg/dL (ref 70–99)
Glucose-Capillary: 93 mg/dL (ref 70–99)
Glucose-Capillary: 95 mg/dL (ref 70–99)

## 2020-03-30 LAB — PREPARE RBC (CROSSMATCH)

## 2020-03-30 LAB — POC OCCULT BLOOD, ED: Fecal Occult Bld: POSITIVE — AB

## 2020-03-30 LAB — SARS CORONAVIRUS 2 BY RT PCR (HOSPITAL ORDER, PERFORMED IN ~~LOC~~ HOSPITAL LAB): SARS Coronavirus 2: NEGATIVE

## 2020-03-30 LAB — ABO/RH: ABO/RH(D): O POS

## 2020-03-30 MED ORDER — ONDANSETRON HCL 4 MG/2ML IJ SOLN
4.0000 mg | Freq: Four times a day (QID) | INTRAMUSCULAR | Status: DC | PRN
  Administered 2020-04-04 (×2): 4 mg via INTRAVENOUS
  Filled 2020-03-30 (×2): qty 2

## 2020-03-30 MED ORDER — PANTOPRAZOLE SODIUM 40 MG IV SOLR
40.0000 mg | Freq: Once | INTRAVENOUS | Status: AC
  Administered 2020-03-30: 40 mg via INTRAVENOUS
  Filled 2020-03-30: qty 40

## 2020-03-30 MED ORDER — ONDANSETRON HCL 4 MG/2ML IJ SOLN
4.0000 mg | Freq: Once | INTRAMUSCULAR | Status: AC
  Administered 2020-03-30: 4 mg via INTRAVENOUS
  Filled 2020-03-30: qty 2

## 2020-03-30 MED ORDER — ONDANSETRON HCL 4 MG PO TABS: 4.0000 mg | ORAL_TABLET | Freq: Four times a day (QID) | ORAL | Status: DC | PRN

## 2020-03-30 MED ORDER — SODIUM CHLORIDE 0.9 % IV SOLN
10.0000 mL/h | Freq: Once | INTRAVENOUS | Status: AC
  Administered 2020-03-30: 10 mL/h via INTRAVENOUS

## 2020-03-30 MED ORDER — INSULIN ASPART 100 UNIT/ML ~~LOC~~ SOLN
0.0000 [IU] | SUBCUTANEOUS | Status: DC
  Administered 2020-03-31 (×2): 1 [IU] via SUBCUTANEOUS
  Administered 2020-03-31: 3 [IU] via SUBCUTANEOUS
  Administered 2020-04-01 (×2): 1 [IU] via SUBCUTANEOUS
  Administered 2020-04-02: 3 [IU] via SUBCUTANEOUS
  Administered 2020-04-03: 1 [IU] via SUBCUTANEOUS
  Administered 2020-04-03: 2 [IU] via SUBCUTANEOUS
  Administered 2020-04-04: 1 [IU] via SUBCUTANEOUS
  Administered 2020-04-04: 2 [IU] via SUBCUTANEOUS
  Administered 2020-04-05 (×2): 1 [IU] via SUBCUTANEOUS
  Filled 2020-03-30: qty 0.06

## 2020-03-30 MED ORDER — SODIUM CHLORIDE 0.9 % IV SOLN
8.0000 mg/h | INTRAVENOUS | Status: DC
  Administered 2020-03-30 (×2): 8 mg/h via INTRAVENOUS
  Filled 2020-03-30: qty 48
  Filled 2020-03-30 (×3): qty 80

## 2020-03-30 MED ORDER — LEVOTHYROXINE SODIUM 100 MCG/5ML IV SOLN: 50.0000 ug | Freq: Every day | INTRAVENOUS | Status: DC

## 2020-03-30 MED ORDER — QUETIAPINE FUMARATE 25 MG PO TABS
25.0000 mg | ORAL_TABLET | Freq: Two times a day (BID) | ORAL | Status: DC
  Administered 2020-03-30 – 2020-04-08 (×19): 25 mg via ORAL
  Filled 2020-03-30 (×20): qty 1

## 2020-03-30 MED ORDER — PANTOPRAZOLE SODIUM 40 MG IV SOLR: 40.0000 mg | Freq: Two times a day (BID) | INTRAVENOUS | Status: DC

## 2020-03-30 MED ORDER — POTASSIUM CHLORIDE 10 MEQ/100ML IV SOLN
10.0000 meq | Freq: Once | INTRAVENOUS | Status: AC
  Administered 2020-03-30: 10 meq via INTRAVENOUS
  Filled 2020-03-30: qty 100

## 2020-03-30 MED ORDER — POTASSIUM CHLORIDE 10 MEQ/100ML IV SOLN
10.0000 meq | INTRAVENOUS | Status: AC
  Administered 2020-03-30: 10 meq via INTRAVENOUS
  Filled 2020-03-30: qty 100

## 2020-03-30 MED ORDER — ALPRAZOLAM 0.25 MG PO TABS
0.2500 mg | ORAL_TABLET | Freq: Every day | ORAL | Status: DC | PRN
  Administered 2020-04-01 – 2020-04-07 (×2): 0.25 mg via ORAL
  Filled 2020-03-30 (×3): qty 1

## 2020-03-30 NOTE — ED Notes (Signed)
Pt now has a condom cath. Now waiting for Pt to give urine sample.

## 2020-03-30 NOTE — ED Notes (Signed)
Nurse busy at this time, will call back

## 2020-03-30 NOTE — Plan of Care (Signed)
  Problem: Education: Goal: Knowledge of General Education information will improve Description Including pain rating scale, medication(s)/side effects and non-pharmacologic comfort measures Outcome: Progressing   

## 2020-03-30 NOTE — Consult Note (Signed)
Referring Provider:  [facility]         Primary Care Physician:  Biagio Borg, MD Primary Gastroenterologist:   Lucio Edward, MD            We were asked to see this patient for:   GI bleed               ASSESSMENT /  PLAN    Roger Hanson is a 85 y.o. male PMH significant for, but not necessarily limited to,  GERD, diverticulosis. dementia, hypothyroidism, prostate cancer, hyperlipidemia, DM, CKD III                                                                                                                                 # Painless hematochezia / microcytic anemia --There were reports of coffee ground emesis in ED triage but family says patient hasn't had any vomiting, just red blood in stool. His is hemodynamically stable. Upper GI bleed seems unlikely but will leave on PPI infusion but reevaluate its need tomorrow.   --No stool or blood in vault on DRE.  --No exactly clear about duration of the bleeding, could be more subacute. Patient cannot provide history.  --Could be diverticular hemorrhage if acute. Cannot yet exclude neoplasm or other etiologies.  --Getting 1uPRBCs now. I spoke with Daughter ( POA). She wouldn't want colonoscopy unless it was absolutely necessary to further evaluate / manage the bleeding.  --Clears okay.   # Hypokalemia, K+ 2.7 --Repletion in progress  # AKI on CKD --Cr 1.09 >> 1.66  # Dementia --unable to provide history  --Daughter Roger Hanson is POA   HPI:    Chief Complaint: blood in stool per family  Roger Hanson is a 85 y.o. male with dementia, unable to provide history who presented to ED early this am with coffee ground emesis and rectal bleeding. Patient's daughter , Roger Hanson says patient he had a BM with red blood in it last night around 9 pm but he hasn't been vomiting. Patient's son lived with him, was main caregiver. Roger Hanson found brother deceased in the father's home last night from what sounds like a variceal bleed.    Daughter not exactly sure if patient was bleeding prior to episode last night. She showed me a photo of red blood in toilet. Daugher and son-in-law says patient  complained of weakness over last few days. He hasn't been complaining of any abdominal pain. Appetite has been okay, at baseline. It sounds like he has chronic loose stool. Family says patient isn't on blood thinners.  In ED Hgb 6.6, down from mid 12 in January. MCV 73. He has AKI with BUN of 53, Cr 1.6. Stool heme positive.    PREVIOUS ENDOSCOPIC EVALUATIONS / GI STUDIES :  EGD Feb 2019 for GERD Z-line variable, at the gastroesophageal junction. Biopsied. - Enlarged gastric folds. Biopsied. - Erythematous mucosa in the gastric fundus and gastric body. -  Duodenitis.  Sept 2004 Colonoscopy --diverticulosis  Past Medical History:  Diagnosis Date  . Anxiety   . Arthritis of knee, degenerative 04/17/2019  . Chronic gastritis 04/17/2019  . Chronic insomnia 04/17/2019  . CKD (chronic kidney disease) stage 3, GFR 30-59 ml/min 04/17/2019  . DDD (degenerative disc disease), lumbosacral   . Diabetes mellitus without complication ([place])   . Diverticulosis   . GERD (gastroesophageal reflux disease)   . Hearing loss   . HLD (hyperlipidemia) 04/17/2019  . Hypercholesteremia   . Hypertension   . Hypothyroidism   . Insomnia   . Iron deficiency anemia 04/17/2019  . Lumbar disc disease 04/17/2019  . Lumbar radiculopathy   . Peripheral neuropathy 04/17/2019  . Prostate cancer ([place])   . Psoriasis   . PUD (peptic ulcer disease)   . Pulmonary nodules   . Restless legs syndrome (RLS)   . RLS (restless legs syndrome) 04/17/2019  . Vitamin D deficiency 04/17/2019  . Vitamin D deficiency disease     Past Surgical History:  Procedure Laterality Date  . ANKLE SURGERY  2005  . ARTHROSCOPY KNEE W/ DRILLING Right 2007  . CARPAL TUNNEL RELEASE Bilateral 1999  . HEMORROIDECTOMY  1980  . LUMBAR FUSION  2013  . PROSTATECTOMY  2005  . SQUAMOUS  CELL CARCINOMA EXCISION  2008   from scrotum    Prior to Admission medications   Medication Sig Start Date End Date Taking? Authorizing Provider  ALPRAZolam (XANAX) 0.25 MG tablet Take 1 tablet (0.25 mg total) by mouth daily as needed for anxiety. 10/08/19  Yes Biagio Borg, MD  citalopram (CELEXA) 10 MG tablet Take 1 tablet (10 mg total) by mouth daily. 12/23/19 12/22/20 Yes Biagio Borg, MD  donepezil (ARICEPT) 10 MG tablet Take 1 tablet (10 mg total) by mouth at bedtime. 02/04/20  Yes Biagio Borg, MD  Esomeprazole Magnesium (NEXIUM PO) Take 80 mg by mouth in the morning and at bedtime. BRAND NAME ONLY   Yes [provider]  levothyroxine (SYNTHROID) 100 MCG tablet TAKE 1 TABLET BY MOUTH EVERYDAY AT BEDTIME Patient taking differently: Take 100 mcg by mouth daily before breakfast.  06/11/19  Yes Biagio Borg, MD  pantoprazole (PROTONIX) 40 MG tablet Take 1 tablet (40 mg total) by mouth daily. 02/23/20  Yes Biagio Borg, MD  pioglitazone (ACTOS) 15 MG tablet Take 1 tablet (15 mg total) by mouth daily. 01/08/20  Yes Biagio Borg, MD  pravastatin (PRAVACHOL) 80 MG tablet TAKE 1 TABLET BY MOUTH EVERY EVENING Patient taking differently: Take 80 mg by mouth daily.  11/23/19  Yes Biagio Borg, MD  QUEtiapine (SEROQUEL) 25 MG tablet Take 1 tablet (25 mg total) by mouth 2 (two) times daily. 02/04/20  Yes Biagio Borg, MD  TRADJENTA 5 MG TABS tablet Take 1 tablet (5 mg total) by mouth every morning. 06/18/19  Yes Biagio Borg, MD  Accu-Chek Softclix Lancets lancets Use as instructed once daily E11.9 11/30/19   Biagio Borg, MD  Alcohol Swabs (B-D SINGLE USE SWABS REGULAR) PADS Apply 1 Device topically daily. E11.9 11/30/19   Biagio Borg, MD  Blood Glucose Calibration (ACCU-CHEK AVIVA) SOLN 1 Device by In Vitro route daily. E11.9 11/30/19   Biagio Borg, MD  Blood Glucose Monitoring Suppl (ACCU-CHEK AVIVA PLUS) w/Device KIT Apply 1 Device topically daily. Once daily E11.9 11/30/19   Biagio Borg, MD   furosemide (LASIX) 20 MG tablet Take 1 tablet (20 mg total) by  mouth daily as needed. Patient not taking: Reported on 03/30/2020 12/10/19   Biagio Borg, MD  glucose blood (ACCU-CHEK AVIVA PLUS) test strip CHECK GLUCOSES ONCE DAILY E11.9 11/30/19   Biagio Borg, MD  ondansetron (ZOFRAN) 4 MG tablet Take 1 tablet (4 mg total) by mouth 2 (two) times daily as needed for nausea or vomiting. Patient not taking: Reported on 03/30/2020 10/08/19   Biagio Borg, MD  Suvorexant (BELSOMRA) 5 MG TABS Take 5 mg by mouth at bedtime as needed. Patient not taking: Reported on 03/30/2020 05/08/19   Biagio Borg, MD  eszopiclone (LUNESTA) 2 MG TABS tablet Take 1 tablet (2 mg total) by mouth at bedtime as needed for sleep. Take immediately before bedtime 05/07/19 05/08/19  Biagio Borg, MD    Current Facility-Administered Medications  Medication Dose Route Frequency Provider Last Rate Last Admin  . ALPRAZolam (XANAX) tablet 0.25 mg  0.25 mg Oral Daily PRN Rise Patience, MD      . insulin aspart (novoLOG) injection 0-6 Units  0-6 Units Subcutaneous Q4H Rise Patience, MD      . Derrill Memo ON 04/02/2020] levothyroxine (SYNTHROID, LEVOTHROID) injection 50 mcg  50 mcg Intravenous Daily Rise Patience, MD      . ondansetron [facility]) tablet 4 mg  4 mg Oral Q6H PRN Rise Patience, MD       Or  . ondansetron [facility]) injection 4 mg  4 mg Intravenous Q6H PRN Rise Patience, MD      . pantoprazole (PROTONIX) 80 mg in sodium chloride 0.9 % 100 mL (0.8 mg/mL) infusion  8 mg/hr Intravenous Continuous Rise Patience, MD 10 mL/hr at 03/30/20 1359 8 mg/hr at 03/30/20 1359  . [START ON 04/02/2020] pantoprazole (PROTONIX) injection 40 mg  40 mg Intravenous Q12H Rise Patience, MD      . QUEtiapine (SEROQUEL) tablet 25 mg  25 mg Oral BID Rise Patience, MD   25 mg at 03/30/20 0945    Allergies as of 03/29/2020 - Review Complete 02/06/2020  Allergen Reaction Noted  . Glipizide Other (See  Comments) 08/07/2013  . Lovastatin  08/07/2013  . Omeprazole Nausea And Vomiting 08/07/2013  . Protonix [pantoprazole sodium] Nausea Only 08/07/2013    Family History  Problem Relation Age of Onset  . CVA Mother   . Breast cancer Mother   . Diabetes Father   . Diabetes Brother   . Hypertension Brother   . Colon cancer Neg Hx   . Esophageal cancer Neg Hx   . Liver cancer Neg Hx   . Stomach cancer Neg Hx   . Rectal cancer Neg Hx   . Pancreatic cancer Neg Hx     Social History   Socioeconomic History  . Marital status: Widowed    Spouse name: Not on file  . Number of children: Not on file  . Years of education: Not on file  . Highest education level: Not on file  Occupational History  . Not on file  Tobacco Use  . Smoking status: Never Smoker  . Smokeless tobacco: Never Used  Vaping Use  . Vaping Use: Never used  Substance and Sexual Activity  . Alcohol use: No  . Drug use: No  . Sexual activity: Yes    Partners: Female  Other Topics Concern  . Not on file  Social History Narrative  . Not on file   Social Determinants of Health   Financial Resource Strain:   . Difficulty  of Paying Living Expenses:   Food Insecurity: No Food Insecurity  . Worried About Charity fundraiser in the Last Year: Never true  . Ran Out of Food in the Last Year: Never true  Transportation Needs: No Transportation Needs  . Lack of Transportation (Medical): No  . Lack of Transportation (Non-Medical): No  Physical Activity:   . Days of Exercise per Week:   . Minutes of Exercise per Session:   Stress:   . Feeling of Stress :   Social Connections:   . Frequency of Communication with Friends and Family:   . Frequency of Social Gatherings with Friends and Family:   . Attends Religious Services:   . Active Member of Clubs or Organizations:   . Attends Archivist Meetings:   Marland Kitchen Marital Status:   Intimate Partner Violence:   . Fear of Current or Ex-Partner:   . Emotionally  Abused:   Marland Kitchen Physically Abused:   . Sexually Abused:     Review of Systems: All systems reviewed and negative except where noted in HPI.  Physical Exam: Vital signs in last 24 hours: Temp:  [97.5 F (36.4 C)-97.8 F (36.6 C)] 97.5 F (36.4 C) (07/14 1425) Pulse Rate:  [64-85] 78 (07/14 1425) Resp:  [12-18] 14 (07/14 1425) BP: (96-155)/(47-74) 155/74 (07/14 1425) SpO2:  [98 %-100 %] 99 % (07/14 1425) Weight:  [51.9 kg] 51.9 kg (07/14 9833) Last BM Date: 03/29/20 General:   Alert, thin male in NAD Psych:  Pleasant, cooperative. . Eyes:  Pupils equal, sclera clear, no icterus.   Conjunctiva pink. Ears:  Normal auditory acuity. Nose:  No deformity, discharge,  or lesions. Neck:  Supple; no masses Lungs:  Clear throughout to auscultation.   No wheezes, crackles, or rhonchi.  Heart:  Regular rate and rhythm;  no lower extremity edema Abdomen:  Soft, non-distended, nontender, BS active, no palp mass   Rectal:  No stool or blood in vault. No lesions appreciated  Msk:  Symmetrical without gross deformities. . Neurologic:  Alert and  oriented x4;  grossly normal neurologically. Skin:  Intact without significant lesions or rashes.   Intake/Output from previous day: 07/13 0701 - 07/14 0700 In: -  Out: 575 [Urine:575] Intake/Output this shift: Total I/O In: 532 [I.V.:396.9; Blood:30; IV Piggyback:105.1] Out: -   Lab Results: Recent Labs    03/30/20 0114  WBC 4.8  HGB 6.6*  HCT 23.7*  PLT 248   BMET Recent Labs    03/30/20 0114  NA 134*  K 2.7*  CL 94*  CO2 29  GLUCOSE 139*  BUN 53*  CREATININE 1.66*  CALCIUM 8.7*   LFT Recent Labs    03/30/20 0114  PROT 6.4*  ALBUMIN 3.5  AST 14*  ALT 16  ALKPHOS 67  BILITOT 0.7   PT/INR No results for input(s): LABPROT, INR in the last 72 hours. Hepatitis Panel No results for input(s): HEPBSAG, HCVAB, HEPAIGM, HEPBIGM in the last 72 hours.   . CBC Latest Ref Rng & Units 03/30/2020 10/08/2019 04/07/2019  WBC 4.0 -  10.5 K/uL 4.8 5.0 5.3  Hemoglobin 13.0 - 17.0 g/dL 6.6(LL) 12.4(L) 13.7  Hematocrit 39 - 52 % 23.7(L) 37.3(L) 41.5  Platelets 150 - 400 K/uL 248 135.0(L) 164.0    . CMP Latest Ref Rng & Units 03/30/2020 01/07/2020 10/08/2019  Glucose 70 - 99 mg/dL 139(H) 156(H) 184(H)  BUN 8 - 23 mg/dL 53(H) 19 18  Creatinine 0.61 - 1.24 mg/dL 1.66(H) 1.09 1.10  Sodium 135 - 145 mmol/L 134(L) 140 140  Potassium 3.5 - 5.1 mmol/L 2.7(LL) 4.2 3.8  Chloride 98 - 111 mmol/L 94(L) 105 104  CO2 22 - 32 mmol/L 29 29 32  Calcium 8.9 - 10.3 mg/dL 8.7(L) 9.2 9.5  Total Protein 6.5 - 8.1 g/dL 6.4(L) - 6.2  Total Bilirubin 0.3 - 1.2 mg/dL 0.7 - 1.0  Alkaline Phos 38 - 126 U/L 67 - 107  AST 15 - 41 U/L 14(L) - 12  ALT 0 - 44 U/L 16 - 14   Studies/Results: No results found.  Principal Problem:   Acute GI bleeding Active Problems:   Hypertension   Dementia with behavioral disturbance (HCC)   CKD (chronic kidney disease) stage 3, GFR 30-59 ml/min   Hypothyroidism   PUD (peptic ulcer disease)   Acute blood loss anemia    Tye Savoy, NP-C @  03/30/2020, 3:42 PM

## 2020-03-30 NOTE — ED Notes (Signed)
Spoke with floor about bed pending, room to be ready in approx. 10 minutes

## 2020-03-30 NOTE — H&P (Addendum)
History and Physical    COPELAN MAULTSBY YFV:[id number] DOB: 12-Feb-1935 DOA: 03/30/2020  PCP: Biagio Borg, MD  Patient coming from: Home.  Chief Complaint: Rectal bleeding.  History obtained from patient's son-in-law.  Patient has dementia.  HPI: Roger Hanson is a 85 y.o. male with history of diabetes mellitus type 2, B12 deficiency, hypertension, hypothyroidism, dementia and chronic kidney disease stage III was brought to the ER the patient's son-in-law found that patient had rectal bleeding when he went to check on him.  For the last couple of days patient has been found to be increasingly weak and not as ambulatory as he was.  Did not have any nausea vomiting.  Takes baby aspirin usually.  In the ER patient did complain of some abdominal discomfort.  No fever or chills.  Takes occasionally NSAIDs trazodone was not known.  Has had EGD in 2019.  ED Course: In the ER patient's hemoglobin is found to be around 6.6 which is almost a 6 g drop from the hemoglobin done in January 2021.  Creatinine is increased to 1.6 potassium was low at 2.7 LFTs were largely normal.  EKG shows normal sinus rhythm.  Patient was empirically started on Protonix and 1 unit of PRBC transfusion has been ordered and admitted for acute GI bleed.  On exam patient abdomen appears benign.  Covid test is pending.  Review of Systems: As per HPI, rest all negative.   Past Medical History:  Diagnosis Date  . Anxiety   . Arthritis of knee, degenerative 04/17/2019  . Chronic gastritis 04/17/2019  . Chronic insomnia 04/17/2019  . CKD (chronic kidney disease) stage 3, GFR 30-59 ml/min 04/17/2019  . DDD (degenerative disc disease), lumbosacral   . Diabetes mellitus without complication ([place])   . Diverticulosis   . GERD (gastroesophageal reflux disease)   . Hearing loss   . HLD (hyperlipidemia) 04/17/2019  . Hypercholesteremia   . Hypertension   . Hypothyroidism   . Insomnia   . Iron deficiency anemia 04/17/2019  . Lumbar  disc disease 04/17/2019  . Lumbar radiculopathy   . Peripheral neuropathy 04/17/2019  . Prostate cancer ([place])   . Psoriasis   . PUD (peptic ulcer disease)   . Pulmonary nodules   . Restless legs syndrome (RLS)   . RLS (restless legs syndrome) 04/17/2019  . Vitamin D deficiency 04/17/2019  . Vitamin D deficiency disease     Past Surgical History:  Procedure Laterality Date  . ANKLE SURGERY  2005  . ARTHROSCOPY KNEE W/ DRILLING Right 2007  . CARPAL TUNNEL RELEASE Bilateral 1999  . HEMORROIDECTOMY  1980  . LUMBAR FUSION  2013  . PROSTATECTOMY  2005  . SQUAMOUS CELL CARCINOMA EXCISION  2008   from scrotum     reports that he has never smoked. He has never used smokeless tobacco. He reports that he does not drink alcohol and does not use drugs.  Allergies  Allergen Reactions  . Glipizide Other (See Comments)    Erythema multiforme  . Lovastatin     Erythema multiforme  . Omeprazole Nausea And Vomiting  . Protonix [Pantoprazole Sodium] Nausea Only    Family History  Problem Relation Age of Onset  . CVA Mother   . Breast cancer Mother   . Diabetes Father   . Diabetes Brother   . Hypertension Brother   . Colon cancer Neg Hx   . Esophageal cancer Neg Hx   . Liver cancer Neg Hx   . Stomach  cancer Neg Hx   . Rectal cancer Neg Hx   . Pancreatic cancer Neg Hx     Prior to Admission medications   Medication Sig Start Date End Date Taking? Authorizing Provider  ALPRAZolam (XANAX) 0.25 MG tablet Take 1 tablet (0.25 mg total) by mouth daily as needed for anxiety. 10/08/19  Yes Biagio Borg, MD  citalopram (CELEXA) 10 MG tablet Take 1 tablet (10 mg total) by mouth daily. 12/23/19 12/22/20 Yes Biagio Borg, MD  donepezil (ARICEPT) 10 MG tablet Take 1 tablet (10 mg total) by mouth at bedtime. 02/04/20  Yes Biagio Borg, MD  Esomeprazole Magnesium (NEXIUM PO) Take 80 mg by mouth in the morning and at bedtime. BRAND NAME ONLY   Yes [provider]  levothyroxine (SYNTHROID) 100  MCG tablet TAKE 1 TABLET BY MOUTH EVERYDAY AT BEDTIME Patient taking differently: Take 100 mcg by mouth daily before breakfast.  06/11/19  Yes Biagio Borg, MD  pantoprazole (PROTONIX) 40 MG tablet Take 1 tablet (40 mg total) by mouth daily. 02/23/20  Yes Biagio Borg, MD  pioglitazone (ACTOS) 15 MG tablet Take 1 tablet (15 mg total) by mouth daily. 01/08/20  Yes Biagio Borg, MD  pravastatin (PRAVACHOL) 80 MG tablet TAKE 1 TABLET BY MOUTH EVERY EVENING Patient taking differently: Take 80 mg by mouth daily.  11/23/19  Yes Biagio Borg, MD  QUEtiapine (SEROQUEL) 25 MG tablet Take 1 tablet (25 mg total) by mouth 2 (two) times daily. 02/04/20  Yes Biagio Borg, MD  TRADJENTA 5 MG TABS tablet Take 1 tablet (5 mg total) by mouth every morning. 06/18/19  Yes Biagio Borg, MD  Accu-Chek Softclix Lancets lancets Use as instructed once daily E11.9 11/30/19   Biagio Borg, MD  Alcohol Swabs (B-D SINGLE USE SWABS REGULAR) PADS Apply 1 Device topically daily. E11.9 11/30/19   Biagio Borg, MD  Blood Glucose Calibration (ACCU-CHEK AVIVA) SOLN 1 Device by In Vitro route daily. E11.9 11/30/19   Biagio Borg, MD  Blood Glucose Monitoring Suppl (ACCU-CHEK AVIVA PLUS) w/Device KIT Apply 1 Device topically daily. Once daily E11.9 11/30/19   Biagio Borg, MD  furosemide (LASIX) 20 MG tablet Take 1 tablet (20 mg total) by mouth daily as needed. Patient not taking: Reported on 03/30/2020 12/10/19   Biagio Borg, MD  glucose blood (ACCU-CHEK AVIVA PLUS) test strip CHECK GLUCOSES ONCE DAILY E11.9 11/30/19   Biagio Borg, MD  ondansetron (ZOFRAN) 4 MG tablet Take 1 tablet (4 mg total) by mouth 2 (two) times daily as needed for nausea or vomiting. Patient not taking: Reported on 03/30/2020 10/08/19   Biagio Borg, MD  Suvorexant (BELSOMRA) 5 MG TABS Take 5 mg by mouth at bedtime as needed. Patient not taking: Reported on 03/30/2020 05/08/19   Biagio Borg, MD  eszopiclone (LUNESTA) 2 MG TABS tablet Take 1 tablet (2 mg total) by  mouth at bedtime as needed for sleep. Take immediately before bedtime 05/07/19 05/08/19  Biagio Borg, MD    Physical Exam: Constitutional: Moderately built and nourished. Vitals:   03/30/20 0214 03/30/20 0215 03/30/20 0300 03/30/20 0421  BP: 133/60 (!) 125/59 (!) 105/52 (!) 119/51  Pulse: 71 70 69 74  Resp: 12 12 15 15   Temp:      TempSrc:      SpO2: 100% 100% 100% 100%   Eyes: Anicteric mild pallor. ENMT: No discharge from the ears eyes nose or mouth. Neck:  No mass felt.  No neck rigidity. Respiratory: No rhonchi or crepitations. Cardiovascular: S1-S2 heard. Abdomen: Soft nontender bowel sounds present. Musculoskeletal: No edema. Skin: No rash. Neurologic: Alert awake oriented to person and place moves all extremities. Psychiatric: Oriented to person and place.   Labs on Admission: I have personally reviewed following labs and imaging studies  CBC: Recent Labs  Lab 03/30/20 0114  WBC 4.8  HGB 6.6*  HCT 23.7*  MCV 73.4*  PLT 591   Basic Metabolic Panel: Recent Labs  Lab 03/30/20 0114  NA 134*  K 2.7*  CL 94*  CO2 29  GLUCOSE 139*  BUN 53*  CREATININE 1.66*  CALCIUM 8.7*   GFR: CrCl cannot be calculated (Unknown ideal weight.). Liver Function Tests: Recent Labs  Lab 03/30/20 0114  AST 14*  ALT 16  ALKPHOS 67  BILITOT 0.7  PROT 6.4*  ALBUMIN 3.5   No results for input(s): LIPASE, AMYLASE in the last 168 hours. No results for input(s): AMMONIA in the last 168 hours. Coagulation Profile: No results for input(s): INR, PROTIME in the last 168 hours. Cardiac Enzymes: No results for input(s): CKTOTAL, CKMB, CKMBINDEX, TROPONINI in the last 168 hours. BNP (last 3 results) No results for input(s): PROBNP in the last 8760 hours. HbA1C: No results for input(s): HGBA1C in the last 72 hours. CBG: No results for input(s): GLUCAP in the last 168 hours. Lipid Profile: No results for input(s): CHOL, HDL, LDLCALC, TRIG, CHOLHDL, LDLDIRECT in the last 72  hours. Thyroid Function Tests: No results for input(s): TSH, T4TOTAL, FREET4, T3FREE, THYROIDAB in the last 72 hours. Anemia Panel: No results for input(s): VITAMINB12, FOLATE, FERRITIN, TIBC, IRON, RETICCTPCT in the last 72 hours. Urine analysis:    Component Value Date/Time   COLORURINE YELLOW 07/16/2019 1456   APPEARANCEUR Cloudy (A) 07/16/2019 1456   LABSPEC 1.015 07/16/2019 1456   PHURINE 5.5 07/16/2019 1456   GLUCOSEU NEGATIVE 07/16/2019 1456   HGBUR MODERATE (A) 07/16/2019 1456   BILIRUBINUR NEGATIVE 07/16/2019 [place] 07/16/2019 1456   PROTEINUR NEGATIVE 08/07/2017 1830   UROBILINOGEN 0.2 07/16/2019 1456   NITRITE NEGATIVE 07/16/2019 1456   LEUKOCYTESUR MODERATE (A) 07/16/2019 1456   Sepsis Labs: @LABRCNTIP (procalcitonin:4,lacticidven:4) )No results found for this or any previous visit (from the past 240 hour(s)).   Radiological Exams on Admission: No results found.  EKG: Independently reviewed.  Normal sinus rhythm.  Assessment/Plan Principal Problem:   Acute GI bleeding Active Problems:   Hypertension   Dementia with behavioral disturbance (HCC)   CKD (chronic kidney disease) stage 3, GFR 30-59 ml/min   Hypothyroidism   PUD (peptic ulcer disease)   Acute blood loss anemia    1. Acute GI bleed -suspect patient likely has lower GI bleed given the patient has frank rectal bleeding.  Patient is presently hemodynamically stable.  Does take baby aspirin.  Not on any other anticoagulants.  Patient was started empirically on Protonix.  1 unit of PRBC transfusion has been ordered.  Will consult GI in the morning.  Patient has had EGD in 2019 which showed diffuse mildly erythematous mucosa of the gastric fundus and mild inflammation of the duodenum.  Colonoscopy in 2004 showed some diverticulosis.  Patient did say about abdominal pain abdomen appears benign.  We will try to get a CT scan of the abdomen. 2. Acute blood loss anemia for which patient is  receiving 1 unit of PRBC transfusion. 3. History of B12 deficiency on oral B12 supplements. 4. Acute renal failure  with hypokalemia could be from poor oral intake -replace potassium and patient is receiving PRBC transfusion recheck metabolic panel. 5. Diabetes mellitus type 2 we will keep patient on sliding scale coverage for now. 6. Hypothyroidism we will change to IV Synthroid and change to p.o. once patient can take orally. 7. History of dementia recently started on Seroquel and Xanax.  Family was planning to get further work-up on dementia through the primary care with neurologist.  Covid test is pending.  Patient lives with his son who had died tonight.  Patient's healthcare power of attorney is patient's daughter.  Since patient has significant GI bleed will need close monitoring for any further worsening and inpatient status.   DVT prophylaxis: SCDs.  Avoiding anticoagulation due to GI bleed. Code Status: DNR as confirmed with patient's son-in-law. Family Communication: Patient's son-in-law. Disposition Plan: To be determined. Consults called: None. Admission status: Inpatient.   Rise Patience MD [facility] Pager [phone].  If 7PM-7AM, please contact night-coverage [email or web address] Password [facility]  03/30/2020, 4:49 AM

## 2020-03-30 NOTE — Progress Notes (Signed)
This patient is an 85 yr old man who was admitted by my colleague, Dr. Hal Hope earlier this morning for suspected lower GI bleed due to frank rectal bleeding. He was transfused with one unit PRBC's at admission due to hemoglobin at presentation of 6.6. Hemoglobin 6 months ago was 12.4. Will monitor Hemoglobin and hematocrit. Consult GI. He has been started on protonix. He has a history of chronic gastritis. FOBT positive.  His creatinine was elevated over his baseline of 1.1 at 1.66. Monitor creatinine, electrolytes, and volume status. Avoid nephrotoxins and hypotension.  Potassium was low at 2.7. Supplement and monitor.  He carries a past medical history significant for CKD, chronic gastritis, CKD, DDD, DM II, GERD, Hyperlipidemia, hypertension, hypothyroidism, Iron deficiency anemia, peripheral neuropathy, prostate cancer, restless leg syndrome, peptic ulcer disease.  The patient is resting comfortably. No new complaints. Heart and lung exam is within normal limits. Abdomen is soft, non-tender, non-distended.  I have seen and examined this patient myself. I have spent 34 minutes in his evaluation and care.

## 2020-03-30 NOTE — ED Triage Notes (Signed)
Patient arrived with complaints of coffee ground emesis and dark tarry stools over the last few days. Picture shown in triage shows bright red blood.

## 2020-03-30 NOTE — Plan of Care (Signed)

## 2020-03-30 NOTE — ED Notes (Signed)
Pt is too weak to perform orthostatic vitals.

## 2020-03-30 NOTE — ED Provider Notes (Signed)
[facility] DEPT Provider Note   CSN: [id number] Arrival date & time: 03/29/20  2337     History Chief Complaint  Patient presents with  . Rectal Bleeding    KADRIAN PARTCH is a 85 y.o. male.  HPI     This is an 85 year old male with a history of anxiety, dementia, chronic kidney disease, diabetes, hypertension who presents with bright red blood per rectum.  Per the patient's son-in-law, they noted that he had a fairly large bloody bowel movement.  The patient lives with his son but was staying with his daughter when daughter and son-in-law noted the bloody stools.  Patient reportedly ate a good meal and was without complaint otherwise.  Son reports that patient has a history of significant reflux and takes Protonix twice daily.  Not known to be on any anticoagulants.  Son-in-law is unsure how long he may have been having GI bleed.  He did not have any vomiting tonight that they know of.  Patient does not have any complaints at this time.  Of note, son-in-law informed me that they went by the son's house to pick up the patient's insurance card on the way to the hospital.  The patient's son was found in the house facedown and deceased.  Per the son-in-law, there was extensive coffee-ground emesis and blood noted in the patient's bathroom at the son's house.  Chart reviewed.  Patient followed by [facility], Lucio Edward.  Only colonoscopy from 2004 which did show diverticulosis.  Last endoscopy 2019.  Duodenitis noted as well as multiple biopsies taken.  Past Medical History:  Diagnosis Date  . Anxiety   . Arthritis of knee, degenerative 04/17/2019  . Chronic gastritis 04/17/2019  . Chronic insomnia 04/17/2019  . CKD (chronic kidney disease) stage 3, GFR 30-59 ml/min 04/17/2019  . DDD (degenerative disc disease), lumbosacral   . Diabetes mellitus without complication ([place])   . Diverticulosis   . GERD (gastroesophageal reflux disease)   . Hearing loss     . HLD (hyperlipidemia) 04/17/2019  . Hypercholesteremia   . Hypertension   . Hypothyroidism   . Insomnia   . Iron deficiency anemia 04/17/2019  . Lumbar disc disease 04/17/2019  . Lumbar radiculopathy   . Peripheral neuropathy 04/17/2019  . Prostate cancer ([place])   . Psoriasis   . PUD (peptic ulcer disease)   . Pulmonary nodules   . Restless legs syndrome (RLS)   . RLS (restless legs syndrome) 04/17/2019  . Vitamin D deficiency 04/17/2019  . Vitamin D deficiency disease     Patient Active Problem List   Diagnosis Date Noted  . Peripheral edema 12/10/2019  . B12 deficiency 07/16/2019  . Urinary frequency 07/16/2019  . Fever 07/16/2019  . Weight loss 07/16/2019  . Left rotator cuff tear 05/06/2019  . Left shoulder pain 04/20/2019  . CKD (chronic kidney disease) stage 3, GFR 30-59 ml/min 04/17/2019  . GERD (gastroesophageal reflux disease) 04/17/2019  . HLD (hyperlipidemia) 04/17/2019  . Chronic insomnia 04/17/2019  . Hypothyroidism 04/17/2019  . RLS (restless legs syndrome) 04/17/2019  . Arthritis of knee, degenerative 04/17/2019  . Lumbar disc disease 04/17/2019  . PUD (peptic ulcer disease) 04/17/2019  . Anxiety 04/17/2019  . Iron deficiency anemia 04/17/2019  . Peripheral neuropathy 04/17/2019  . Chronic gastritis 04/17/2019  . Vitamin D deficiency 04/17/2019  . Psoriasis 04/17/2019  . Hypertension 04/07/2019  . Dementia with behavioral disturbance ([place]) 04/07/2019  . Preventative health care 04/07/2019  . Hypovolemia   .  AKI (acute kidney injury) ([place]) 08/07/2017  . Enteritis 08/07/2013  . Acute renal failure ([place]) 08/07/2013  . Diabetes mellitus ([place]) 08/07/2013  . Prostate cancer ([place]) 02/04/2012  . Mixed incontinence 02/04/2012    Past Surgical History:  Procedure Laterality Date  . ANKLE SURGERY  2005  . ARTHROSCOPY KNEE W/ DRILLING Right 2007  . CARPAL TUNNEL RELEASE Bilateral 1999  . HEMORROIDECTOMY  1980  . LUMBAR FUSION  2013  . PROSTATECTOMY  2005   . SQUAMOUS CELL CARCINOMA EXCISION  2008   from scrotum       Family History  Problem Relation Age of Onset  . CVA Mother   . Breast cancer Mother   . Diabetes Father   . Diabetes Brother   . Hypertension Brother   . Colon cancer Neg Hx   . Esophageal cancer Neg Hx   . Liver cancer Neg Hx   . Stomach cancer Neg Hx   . Rectal cancer Neg Hx   . Pancreatic cancer Neg Hx     Social History   Tobacco Use  . Smoking status: Never Smoker  . Smokeless tobacco: Never Used  Vaping Use  . Vaping Use: Never used  Substance Use Topics  . Alcohol use: No  . Drug use: No    Home Medications Prior to Admission medications   Medication Sig Start Date End Date Taking? Authorizing Provider  Accu-Chek Softclix Lancets lancets Use as instructed once daily E11.9 11/30/19   Biagio Borg, MD  Alcohol Swabs (B-D SINGLE USE SWABS REGULAR) PADS Apply 1 Device topically daily. E11.9 11/30/19   Biagio Borg, MD  ALPRAZolam Duanne Moron) 0.25 MG tablet Take 1 tablet (0.25 mg total) by mouth daily as needed for anxiety. 10/08/19   Biagio Borg, MD  Blood Glucose Calibration (ACCU-CHEK AVIVA) SOLN 1 Device by In Vitro route daily. E11.9 11/30/19   Biagio Borg, MD  Blood Glucose Monitoring Suppl (ACCU-CHEK AVIVA PLUS) w/Device KIT Apply 1 Device topically daily. Once daily E11.9 11/30/19   Biagio Borg, MD  cholecalciferol (VITAMIN D) 1000 UNITS tablet Take 2,000 Units by mouth daily.    [provider]  citalopram (CELEXA) 10 MG tablet Take 1 tablet (10 mg total) by mouth daily. 12/23/19 12/22/20  Biagio Borg, MD  donepezil (ARICEPT) 10 MG tablet Take 1 tablet (10 mg total) by mouth at bedtime. 02/04/20   Biagio Borg, MD  furosemide (LASIX) 20 MG tablet Take 1 tablet (20 mg total) by mouth daily as needed. 12/10/19   Biagio Borg, MD  glucose blood (ACCU-CHEK AVIVA PLUS) test strip CHECK GLUCOSES ONCE DAILY E11.9 11/30/19   Biagio Borg, MD  levothyroxine (SYNTHROID) 100 MCG tablet TAKE 1 TABLET  BY MOUTH EVERYDAY AT BEDTIME 06/11/19   Biagio Borg, MD  ondansetron (ZOFRAN) 4 MG tablet Take 1 tablet (4 mg total) by mouth 2 (two) times daily as needed for nausea or vomiting. 10/08/19   Biagio Borg, MD  pantoprazole (PROTONIX) 40 MG tablet Take 1 tablet (40 mg total) by mouth daily. 02/23/20   Biagio Borg, MD  pioglitazone (ACTOS) 15 MG tablet Take 1 tablet (15 mg total) by mouth daily. 01/08/20   Biagio Borg, MD  pravastatin (PRAVACHOL) 80 MG tablet TAKE 1 TABLET BY MOUTH EVERY EVENING 11/23/19   Biagio Borg, MD  QUEtiapine (SEROQUEL) 25 MG tablet Take 1 tablet (25 mg total) by mouth 2 (two) times daily. 02/04/20   Jenny Reichmann,  Hunt Oris, MD  Suvorexant (BELSOMRA) 5 MG TABS Take 5 mg by mouth at bedtime as needed. 05/08/19   Biagio Borg, MD  TRADJENTA 5 MG TABS tablet Take 1 tablet (5 mg total) by mouth every morning. 06/18/19   Biagio Borg, MD  eszopiclone (LUNESTA) 2 MG TABS tablet Take 1 tablet (2 mg total) by mouth at bedtime as needed for sleep. Take immediately before bedtime 05/07/19 05/08/19  Biagio Borg, MD    Allergies    Glipizide, Lovastatin, Omeprazole, and Protonix [pantoprazole sodium]  Review of Systems   Review of Systems  Constitutional: Negative for fever.  Respiratory: Negative for shortness of breath.   Cardiovascular: Negative for chest pain.  Gastrointestinal: Positive for blood in stool. Negative for abdominal pain, constipation, diarrhea and vomiting.  Genitourinary: Negative for dysuria.  Psychiatric/Behavioral: Positive for confusion.  All other systems reviewed and are negative.   Physical Exam Updated Vital Signs BP 133/60 (BP Location: Right Arm)   Pulse 71   Temp 97.8 F (36.6 C) (Oral)   Resp 12   SpO2 100%   Physical Exam Vitals and nursing note reviewed.  Constitutional:      Appearance: He is well-developed.     Comments: Chronically ill-appearing, pale, no acute distress  HENT:     Head: Normocephalic and atraumatic.     Nose: Nose normal.      Mouth/Throat:     Mouth: Mucous membranes are moist.  Eyes:     Pupils: Pupils are equal, round, and reactive to light.  Cardiovascular:     Rate and Rhythm: Normal rate and regular rhythm.     Heart sounds: Normal heart sounds. No murmur heard.   Pulmonary:     Effort: Pulmonary effort is normal. No respiratory distress.     Breath sounds: Normal breath sounds. No wheezing.  Abdominal:     General: Bowel sounds are normal.     Palpations: Abdomen is soft.     Tenderness: There is no abdominal tenderness. There is no rebound.  Genitourinary:    Comments: Slightly pink-tinged brown stool noted on rectal exam Musculoskeletal:     Cervical back: Neck supple.     Right lower leg: No edema.     Left lower leg: No edema.  Lymphadenopathy:     Cervical: No cervical adenopathy.  Skin:    General: Skin is warm and dry.  Neurological:     Mental Status: He is alert.     Comments: Oriented to person and place but not time  Psychiatric:     Comments: Confused but pleasant     ED Results / Procedures / Treatments   Labs (all labs ordered are listed, but only abnormal results are displayed) Labs Reviewed  COMPREHENSIVE METABOLIC PANEL - Abnormal; Notable for the following components:      Result Value   Sodium 134 (*)    Potassium 2.7 (*)    Chloride 94 (*)    Glucose, Bld 139 (*)    BUN 53 (*)    Creatinine, Ser 1.66 (*)    Calcium 8.7 (*)    Total Protein 6.4 (*)    AST 14 (*)    GFR calc non Af Amer 38 (*)    GFR calc Af Amer 44 (*)    All other components within normal limits  CBC - Abnormal; Notable for the following components:   RBC 3.23 (*)    Hemoglobin 6.6 (*)    HCT 23.7 (*)  MCV 73.4 (*)    MCH 20.4 (*)    MCHC 27.8 (*)    RDW 22.5 (*)    All other components within normal limits  POC OCCULT BLOOD, ED - Abnormal; Notable for the following components:   Fecal Occult Bld POSITIVE (*)    All other components within normal limits  URINALYSIS, ROUTINE W  REFLEX MICROSCOPIC  TYPE AND SCREEN  PREPARE RBC (CROSSMATCH)  ABO/RH    EKG EKG Interpretation  Date/Time:  Wednesday March 30 2020 02:13:40 EDT Ventricular Rate:  72 PR Interval:    QRS Duration: 98 QT Interval:  425 QTC Calculation: 466 R Axis:   87 Text Interpretation: Sinus rhythm Borderline right axis deviation Confirmed by Thayer Jew [phone]) on 03/30/2020 3:34:19 AM   Radiology No results found.  Procedures .Critical Care Performed by: Merryl Hacker, MD Authorized by: Merryl Hacker, MD   Critical care provider statement:    Critical care time (minutes):  45   Critical care was necessary to treat or prevent imminent or life-threatening deterioration of the following conditions:  Metabolic crisis   Critical care was time spent personally by me on the following activities:  Discussions with consultants, evaluation of patient's response to treatment, examination of patient, ordering and performing treatments and interventions, ordering and review of laboratory studies, ordering and review of radiographic studies, pulse oximetry, re-evaluation of patient's condition, obtaining history from patient or surrogate and review of old charts   I assumed direction of critical care for this patient from another provider in my specialty: no     (including critical care time)  Medications Ordered in ED Medications  pantoprazole (PROTONIX) injection 40 mg (has no administration in time range)  ondansetron (ZOFRAN) injection 4 mg (has no administration in time range)  0.9 %  sodium chloride infusion (has no administration in time range)  potassium chloride 10 mEq in 100 mL IVPB (has no administration in time range)  pantoprazole (PROTONIX) 80 mg in sodium chloride 0.9 % 100 mL (0.8 mg/mL) infusion (has no administration in time range)  pantoprazole (PROTONIX) injection 40 mg (has no administration in time range)    ED Course  I have reviewed the triage vital signs and  the nursing notes.  Pertinent labs & imaging results that were available during my care of the patient were reviewed by me and considered in my medical decision making (see chart for details).    MDM Rules/Calculators/A&P                           Patient presents with bright red blood per rectum.  He is unable to provide history but noted bloody bowel movement by son-in-law.  He is pale appearing but nontoxic and vital signs are reassuring.  He is not in shock or hemodynamically unstable.  He does appear chronically ill-appearing.  Abdomen is soft.  He has some pink-tinged stool but no active bright red blood per rectum.  History of significant reflux and peptic ulcer.  He was given IV Protonix.  Hemoglobin noted to be 6.6.  Given his history, would like to transfuse.  Also noted to be hypokalemic.  No EKG changes.  Patient was ordered 2 rounds of IV potassium.  He is not clinically unstable.  We will plan for admission to the hospitalist.  He will need GI evaluation later this morning.  Do not feel he needs this emergently.  He has remained hemodynamically stable.  Final Clinical Impression(s) / ED Diagnoses Final diagnoses:  Acute GI bleeding  Hypokalemia  Anemia, blood loss    Rx / DC Orders ED Discharge Orders    None       Merryl Hacker, MD 03/30/20 [phone]

## 2020-03-31 LAB — HEMOGLOBIN AND HEMATOCRIT, BLOOD
HCT: 26.4 % — ABNORMAL LOW (ref 39.0–52.0)
HCT: 23.5 % — ABNORMAL LOW (ref 39.0–52.0)
Hemoglobin: 7.5 g/dL — ABNORMAL LOW (ref 13.0–17.0)
Hemoglobin: 6.7 g/dL — CL (ref 13.0–17.0)

## 2020-03-31 LAB — BASIC METABOLIC PANEL
Anion gap: 10 (ref 5–15)
BUN: 36 mg/dL — ABNORMAL HIGH (ref 8–23)
CO2: 27 mmol/L (ref 22–32)
Calcium: 8.7 mg/dL — ABNORMAL LOW (ref 8.9–10.3)
Chloride: 106 mmol/L (ref 98–111)
Creatinine, Ser: 1.05 mg/dL (ref 0.61–1.24)
GFR calc Af Amer: >60 mL/min (ref >60)
GFR calc non Af Amer: >60 mL/min (ref >60)
Glucose, Bld: 95 mg/dL (ref 70–99)
Potassium: 3.3 mmol/L — ABNORMAL LOW (ref 3.5–5.1)
Sodium: 143 mmol/L (ref 135–145)

## 2020-03-31 LAB — GLUCOSE, CAPILLARY
Glucose-Capillary: 116 mg/dL — ABNORMAL HIGH (ref 70–99)
Glucose-Capillary: 171 mg/dL — ABNORMAL HIGH (ref 70–99)
Glucose-Capillary: 188 mg/dL — ABNORMAL HIGH (ref 70–99)
Glucose-Capillary: 276 mg/dL — ABNORMAL HIGH (ref 70–99)
Glucose-Capillary: 76 mg/dL (ref 70–99)
Glucose-Capillary: 97 mg/dL (ref 70–99)

## 2020-03-31 LAB — PREPARE RBC (CROSSMATCH)

## 2020-03-31 LAB — MAGNESIUM: Magnesium: 1.7 mg/dL (ref 1.7–2.4)

## 2020-03-31 MED ORDER — SODIUM CHLORIDE 0.9% IV SOLUTION: Freq: Once | INTRAVENOUS | Status: DC

## 2020-03-31 MED ORDER — POTASSIUM CHLORIDE 10 MEQ/100ML IV SOLN
10.0000 meq | INTRAVENOUS | Status: AC
  Administered 2020-03-31 (×4): 10 meq via INTRAVENOUS
  Filled 2020-03-31 (×5): qty 100

## 2020-03-31 NOTE — Progress Notes (Signed)
[facility] Progress Note  CC:  Hematochezia/GI bleed/anemia  Subjective:  Feels good.  He denies any bleeding, but is very confused/demented.  Wants to eat "something substantial".  Per nurse, no sign of bleeding.  Hgb is stable.  Objective:  Vital signs in last 24 hours: Temp:  [97.5 F (36.4 C)-98.1 F (36.7 C)] 98.1 F (36.7 C) (07/15 0515) Pulse Rate:  [64-78] 75 (07/15 0515) Resp:  [14-16] 16 (07/15 0515) BP: (96-155)/(57-89) 149/67 (07/15 0515) SpO2:  [98 %-100 %] 100 % (07/15 0515) Last BM Date: 03/29/20 General:  Alert, Well-developed, in NAD Heart:  Regular rate and rhythm; no murmurs Pulm:  CTAB.  No increased WOB.   Abdomen:  Soft, non-distended.  BS present.  Non-tender. Extremities:  Without edema. Neurologic:  Alert, pleasant but very confused.  Oriented only to self.  Intake/Output from previous day: 07/14 0701 - 07/15 0700 In: 1120.1 [P.O.:120; I.V.:543; Blood:352; IV Piggyback:105.1] Out: 1800 [Urine:1800]  Lab Results: Recent Labs    03/30/20 0114 03/30/20 0114 03/30/20 1859 03/30/20 2157 03/31/20 0542  WBC 4.8  --   --   --   --   HGB 6.6*   < > 7.3* 7.6* 7.5*  HCT 23.7*   < > 25.5* 26.5* 26.4*  PLT 248  --   --   --   --    < > = values in this interval not displayed.   BMET Recent Labs    03/30/20 0114 03/31/20 0542  NA 134* 143  K 2.7* 3.3*  CL 94* 106  CO2 29 27  GLUCOSE 139* 95  BUN 53* 36*  CREATININE 1.66* 1.05  CALCIUM 8.7* 8.7*   LFT Recent Labs    03/30/20 0114  PROT 6.4*  ALBUMIN 3.5  AST 14*  ALT 16  ALKPHOS 67  BILITOT 0.7   CT HEAD WO CONTRAST  Result Date: 03/30/2020 CLINICAL DATA:  Stroke, follow-up. EXAM: CT HEAD WITHOUT CONTRAST TECHNIQUE: Contiguous axial images were obtained from the base of the skull through the vertex without intravenous contrast. COMPARISON:  Brain MRI 11/12/2016 FINDINGS: Brain: Stable, moderate generalized parenchymal atrophy. There is mild ill-defined  hypoattenuation within the cerebral white matter which is nonspecific, but consistent with chronic small vessel ischemic disease. There is no acute intracranial hemorrhage. No demarcated cortical infarct is identified. No extra-axial fluid collection. No evidence of intracranial mass. No midline shift. Vascular: No hyperdense vessel.  Atherosclerotic calcifications. Skull: Normal. Negative for fracture or focal lesion. Sinuses/Orbits: Visualized orbits show no acute finding. Mild ethmoid and maxillary sinus mucosal thickening. Small left maxillary sinus mucous retention cyst. No significant mastoid effusion. IMPRESSION: No CT evidence of acute intracranial abnormality. Stable moderate generalized parenchymal atrophy and mild chronic small vessel ischemic disease. Mild ethmoid and maxillary sinus mucosal thickening. Small left maxillary sinus mucous retention cyst. Electronically Signed   By: Kellie Simmering DO   On: 03/30/2020 18:24   Assessment / Plan: Roger Hanson is a 85 y.o. male PMH significant for but not necessarily limited to GERD, diverticulosis, dementia, hypothyroidism, prostate cancer, hyperlipidemia, DM, CKD III                                                                                                                              #  Painless hematochezia / microcytic anemia --There were reports of coffee ground emesis in ED triage but family says patient hasn't had any vomiting, just red blood in stool.  Is on PPI gtt for the questionable CGE.  His is hemodynamically stable.  He has had no further sign of GI bleeding. --No stool or blood in vault on DRE yesterday.  --Not exactly clear about duration of the bleeding, could be more subacute. Patient cannot provide history and actually declines any bleeding at all.  --Could be diverticular hemorrhage if acute. Cannot yet exclude neoplasm or other etiologies.  --Received 1uPRBCs. Tye Savoy spoke with Daughter (POA). She wouldn't want  colonoscopy unless it was absolutely necessary to further evaluate / manage the bleeding.  Hgb now stable in the 7 gram range. --Will discontinue PPI gtt.  Can resume home PPI at discharge. --Will advance to soft diet. --If no further bleeding then home soon, ? Later today vs tomorrow AM.  # Hypokalemia, K+ 2.7 on admission and up to 3.3 this AM  # AKI on CKD --Cr improving  # Dementia --unable to provide history  --Daughter [name] is POA   LOS: 1 day   Laban Emperor. Vanya Carberry  03/31/2020, 9:31 AM

## 2020-03-31 NOTE — Evaluation (Signed)
Physical Therapy Evaluation Patient Details Name: Roger Hanson MRN: [id number] DOB: July 03, 1935 Today's Date: 03/31/2020   History of Present Illness  Roger Hanson is a 85 y.o. male PMH significant for  GERD, diverticulosis. dementia, hypothyroidism, prostate cancer, hyperlipidemia, DM, CKD III, brought to Ed for GIB.  Clinical Impression  The patient is pleasantly confused and very participatory. Patient ambulated with Rw and min assist for safety. Patient's son ,caregiver, is deceased since patient's admission. Pt admitted with above diagnosis.  Pt currently with functional limitations due to the deficits listed below (see PT Problem List). Pt will benefit from skilled PT to increase their independence and safety with mobility to allow discharge to the venue listed below.       Follow Up Recommendations SNF;Supervision/Assistance - 24 hour    Equipment Recommendations  None recommended by PT    Recommendations for Other Services       Precautions / Restrictions Precautions Precautions: Fall      Mobility  Bed Mobility Overal bed mobility: Needs Assistance Bed Mobility: Supine to Sit     Supine to sit: Supervision     General bed mobility comments: no assistance  Transfers Overall transfer level: Needs assistance Equipment used: Rolling walker (2 wheeled) Transfers: Sit to/from Stand Sit to Stand: Min guard            Ambulation/Gait Ambulation/Gait assistance: Min guard Gait Distance (Feet): 120 Feet Assistive device: Rolling walker (2 wheeled) Gait Pattern/deviations: Step-through pattern Gait velocity: decr   General Gait Details: gait steady with RW, moved around objects in hallway, turns without LOB  Stairs            Wheelchair Mobility    Modified Rankin (Stroke Patients Only)       Balance Overall balance assessment: Needs assistance Sitting-balance support: No upper extremity supported;Feet supported Sitting balance-Leahy Scale:  Good Sitting balance - Comments: good reach over an pull up socks     Standing balance-Leahy Scale: Fair Standing balance comment: reliant on Rw today                             Pertinent Vitals/Pain      Home Living Family/patient expects to be discharged to:: Unsure Living Arrangements: Children               Additional Comments: lived with son who is now deceased, per Rn, plans SNF    Prior Function Level of Independence: Independent               Hand Dominance   Dominant Hand: Right    Extremity/Trunk Assessment   Upper Extremity Assessment Upper Extremity Assessment: Defer to OT evaluation    Lower Extremity Assessment Lower Extremity Assessment: Generalized weakness    Cervical / Trunk Assessment Cervical / Trunk Assessment: Normal  Communication   Communication: No difficulties  Cognition Arousal/Alertness: Awake/alert Behavior During Therapy: WFL for tasks assessed/performed Overall Cognitive Status: History of cognitive impairments - at baseline                                 General Comments: pleasantly confused, repeats  that he is [name] and his father owns a [employer] store      General Comments      Exercises     Assessment/Plan    PT Assessment Patient needs continued PT services  PT  Problem List Decreased strength;Decreased safety awareness;Decreased mobility;Decreased activity tolerance;Decreased balance;Decreased knowledge of use of DME;Decreased cognition;Decreased knowledge of precautions       PT Treatment Interventions DME instruction;Therapeutic activities;Gait training;Therapeutic exercise;Patient/family education;Functional mobility training    PT Goals (Current goals can be found in the Care Plan section)  Acute Rehab PT Goals PT Goal Formulation: Patient unable to participate in goal setting Time For Goal Achievement: 04/14/20 Potential to Achieve Goals: Good    Frequency Min  2X/week   Barriers to discharge Decreased caregiver support      Co-evaluation               AM-PAC PT "6 Clicks" Mobility  Outcome Measure Help needed turning from your back to your side while in a flat bed without using bedrails?: A Little Help needed moving from lying on your back to sitting on the side of a flat bed without using bedrails?: A Little Help needed moving to and from a bed to a chair (including a wheelchair)?: A Little Help needed standing up from a chair using your arms (e.g., wheelchair or bedside chair)?: A Little Help needed to walk in hospital room?: A Little Help needed climbing 3-5 steps with a railing? : A Lot 6 Click Score: 17    End of Session Equipment Utilized During Treatment: Gait belt Activity Tolerance: Patient tolerated treatment well Patient left: in chair;with call bell/phone within reach;with chair alarm set Nurse Communication: Mobility status PT Visit Diagnosis: Unsteadiness on feet (R26.81)    Time: 8016-5537 PT Time Calculation (min) (ACUTE ONLY): 30 min   Charges:   PT Evaluation $PT Eval Low Complexity: 1 Low PT Treatments $Gait Training: 8-22 mins        [place] Pager [phone] Office [phone]   Claretha Cooper 03/31/2020, 10:50 AM

## 2020-03-31 NOTE — Progress Notes (Signed)
Assumed care of this patient at 1500.  I agree with the previous nurses assessment.   

## 2020-03-31 NOTE — Progress Notes (Signed)
PROGRESS NOTE  BELMONT VALLI AVW:[id number] DOB: Jan 22, 1935 DOA: 03/30/2020 PCP: Biagio Borg, MD  Brief History   This patient is an 85 yr old man who was admitted by my colleague, Dr. Hal Hope earlier this morning for suspected lower GI bleed due to frank rectal bleeding. He was transfused with one unit PRBC's at admission due to hemoglobin at presentation of 6.6. Hemoglobin 6 months ago was 12.4. Will monitor Hemoglobin and hematocrit. Consult GI. He has been started on protonix. He has a history of chronic gastritis. FOBT positive. He was given a transfusion of 1 unit PRBC's.  His creatinine was elevated over his baseline of 1.1 at 1.66. Monitor creatinine, electrolytes, and volume status. Avoid nephrotoxins and hypotension.  Potassium was low at 2.7. Supplement and monitor.  He carries a past medical history significant for CKD, chronic gastritis, CKD, DDD, DM II, GERD, Hyperlipidemia, hypertension, hypothyroidism, Iron deficiency anemia, peripheral neuropathy, prostate cancer, restless leg syndrome, peptic ulcer disease.  The patient was admitted to a telemetry bed. GI was consulted and it was determined that the family did not desire for the patient to undergo invasive procedure such as EGD. GI has signed off. The patient's diet will be advanced, and his hemoglobin will be monitored. He was continued on daily PPI.   Today his hemoglobin dropped to 6.7 again. He will receive 1 unit PRBC's in transfusion.  Consultants  . Gastroenterology  Procedures  . None  Antibiotics   Anti-infectives (From admission, onward)   None    .  Subjective  The patient is resting quietly. No new complaints.  Objective   Vitals:  Vitals:   03/31/20 0515 03/31/20 1138  BP: (!) 149/67 (!) 104/51  Pulse: 75 85  Resp: 16 18  Temp: 98.1 F (36.7 C) 98 F (36.7 C)  SpO2: 100% 100%   Exam:  Constitutional:  . The patient is awake, alert, and oriented x 3. No acute  distress. Respiratory:  . No increased work of breathing. . No wheezes, rales, or rhonchi . No tactile fremitus Cardiovascular:  . Regular rate and rhythm . No murmurs, ectopy, or gallups. . No lateral PMI. No thrills. Abdomen:  . Abdomen is soft, non-tender, non-distended . No hernias, masses, or organomegaly . Normoactive bowel sounds.  Musculoskeletal:  . No cyanosis, clubbing, or edema Skin:  . No rashes, lesions, ulcers . palpation of skin: no induration or nodules Neurologic:  . CN 2-12 intact . Sensation all 4 extremities intact   I have personally reviewed the following:   Today's Data  . Vitals, CBC, BMP  Scheduled Meds: . sodium chloride   Intravenous Once  . insulin aspart  0-6 Units Subcutaneous Q4H  . [START ON 04/02/2020] levothyroxine  50 mcg Intravenous Daily  . QUEtiapine  25 mg Oral BID   Continuous Infusions:  Principal Problem:   Acute GI bleeding Active Problems:   Hypertension   Dementia with behavioral disturbance (HCC)   CKD (chronic kidney disease) stage 3, GFR 30-59 ml/min   Hypothyroidism   PUD (peptic ulcer disease)   Acute blood loss anemia   LOS: 1 day   A & P  Acute GI bleed -suspect patient likely has lower GI bleed given the patient has frank rectal bleeding.  Patient is presently hemodynamically stable.  Does take baby aspirin.  Not on any other anticoagulants.  Patient was started empirically on Protonix.  1 unit of PRBC transfusion has been ordered.  Will consult GI in the morning.  Patient has had EGD in 2019 which showed diffuse mildly erythematous mucosa of the gastric fundus and mild inflammation of the duodenum.  Colonoscopy in 2004 showed some diverticulosis.  Patient did say about abdominal pain abdomen appears benign.  The patient was admitted to a med surg bed. GI was consulted. They have discussed the patient with his family. The family states that they do not wish invasive studies. The patient's diet will be advanced as  tolerated, and he will be continued on his PPI. This afternoon the patient's hemoglobin dropped to 6.7 after being stable in the 7.5 range for the last day. He will receive another unit of blood in transfusion. Continue to monitor.  Acute blood loss anemia for which patient has received s unit of PRBC transfusion. Continue to monitor hemoglobin. Re-consult GI for evidence of frank and brisk bleeding.  B12 deficiency: Continue the patient on oral B12 supplements.  Acute renal failure with hypokalemia could be from poor oral intake -replace potassium and patient is receiving PRBC transfusion recheck metabolic panel.  Diabetes mellitus type 2 we will keep patient on sliding scale coverage for now.  Hypothyroidism we will change to IV Synthroid and change to p.o. once patient can take orally.  History of dementia recently started on Seroquel and Xanax.  Family was planning to get further work-up on dementia through the primary care with neurologist.  I have seen and examined this patient myself. I have spent 38 minutes in his evaluation and care.  DVT Prophylaxis: SCD's CODE STATUS: DNR Family Communication: None available. Disposition: Patient is from home. Anticipate discharge to home. Barriers to discharge - unstable hemoglobin.  Status is: Inpatient  Remains inpatient appropriate because:Inpatient level of care appropriate due to severity of illness   Dispo: The patient is from: Home              Anticipated d/c is to: Home              Anticipated d/c date is: 1 day              Patient currently is not medically stable to d/c.  Cosby Proby, DO [facility] Direct contact: see [email or web address]  7PM-7AM contact night coverage as above 03/31/2020, 5:56 PM  LOS: 1 day

## 2020-03-31 NOTE — Consult Note (Signed)
   [facility] Inpatient Consult   03/31/2020  Roger Hanson 1935/08/26 [id number]   Patient is currently active with [facility] Care Management (CM) services for chronic disease management, diabetes.  Patient has been engaged by a [place] community Tourist information centre manager.   Will continue to follow for progression and disposition plans and update community RN of patient admission.  Of note, [facility] Care Management services does not replace or interfere with any services that are arranged by inpatient case management or social work.  Netta Cedars, MSN, [place] [facility] Liaison Nurse Mobile Phone [phone]  Toll free office [phone]

## 2020-03-31 NOTE — Evaluation (Signed)
Occupational Therapy Evaluation Patient Details Name: Roger Hanson MRN: [id number] DOB: 1935/04/24 Today's Date: 03/31/2020    History of Present Illness Roger Hanson is a 85 y.o. male PMH significant for  GERD, diverticulosis. dementia, hypothyroidism, prostate cancer, hyperlipidemia, DM, CKD III, brought to [facility] for GIB.   Clinical Impression   Patient seated in recliner upon arrival, pleasantly confused and can easily redirect to tasks. Patient ambulate with rolling walker with min A for safety navigating obstacles in room. Min A for transfer back to recliner with cues for safety with body mechanics. Patient min G standing sink side to brush teeth. Patient require mod cues for visual scanning and problem solving during session. Patient's caregiver, his son, is recently deceased since patient's admission. Patient currently with functional limitations due to deficits listed below. Patient will benefit from continued OT to increase independence and safety with self care.     Follow Up Recommendations  Supervision/Assistance - 24 hour;SNF    Equipment Recommendations  None recommended by OT       Precautions / Restrictions Precautions Precautions: Fall Restrictions Weight Bearing Restrictions: No      Mobility Bed Mobility               General bed mobility comments: in recliner upon arrival  Transfers Overall transfer level: Needs assistance Equipment used: Rolling walker (2 wheeled) Transfers: Sit to/from Stand Sit to Stand: Min assist         General transfer comment: cues for safety with body mechanics    Balance Overall balance assessment: Needs assistance Sitting-balance support: No upper extremity supported;Feet supported Sitting balance-Leahy Scale: Good     Standing balance support: Bilateral upper extremity supported;During functional activity Standing balance-Leahy Scale: Fair Standing balance comment: stood sink side without walker                            ADL either performed or assessed with clinical judgement   ADL Overall ADL's : Needs assistance/impaired     Grooming: Oral care;Min guard;Standing   Upper Body Bathing: Supervision/ safety;Sitting   Lower Body Bathing: Minimal assistance;Sit to/from stand   Upper Body Dressing : Supervision/safety;Sitting   Lower Body Dressing: Minimal assistance;Sit to/from stand;Sitting/lateral leans   Toilet Transfer: Minimal assistance;Cueing for safety;RW Toilet Transfer Details (indicate cue type and reason): ambulated to bathroom to brush teeth then transferred back to recliner, cues for safety to reach back for chair. requires mod cues for problem solving Toileting- Clothing Manipulation and Hygiene: Minimal assistance;Sit to/from stand       Functional mobility during ADLs: Minimal assistance;Cueing for safety;Cueing for sequencing;Rolling walker General ADL Comments: per PT eval patient I at baseline, currently min A for safety                   Pertinent Vitals/Pain Pain Assessment: Faces Faces Pain Scale: No hurt     Hand Dominance Right   Extremity/Trunk Assessment Upper Extremity Assessment Upper Extremity Assessment: Overall WFL for tasks assessed   Lower Extremity Assessment Lower Extremity Assessment: Defer to PT evaluation       Communication Communication Communication: No difficulties   Cognition Arousal/Alertness: Awake/alert Behavior During Therapy: WFL for tasks assessed/performed Overall Cognitive Status: History of cognitive impairments - at baseline                                 General  Comments: pleasantly confused, repeats  that he is [name] and his father owns a [employer] store              Home Living Family/patient expects to be discharged to:: Unsure Living Arrangements: Children                               Additional Comments: lived with son who is now deceased, per RN,  plan is SNF      Prior Functioning/Environment Level of Independence: Independent                 OT Problem List: Decreased activity tolerance;Impaired balance (sitting and/or standing);Decreased safety awareness      OT Treatment/Interventions: Self-care/ADL training;Therapeutic activities;Patient/family education;Balance training;DME and/or AE instruction    OT Goals(Current goals can be found in the care plan section) Acute Rehab OT Goals Patient Stated Goal: "can you call my daddy?" OT Goal Formulation: With patient Time For Goal Achievement: 04/14/20 Potential to Achieve Goals: Good  OT Frequency: Min 2X/week    AM-PAC OT "6 Clicks" Daily Activity     Outcome Measure Help from another person eating meals?: A Little Help from another person taking care of personal grooming?: A Little Help from another person toileting, which includes using toliet, bedpan, or urinal?: A Little Help from another person bathing (including washing, rinsing, drying)?: A Little Help from another person to put on and taking off regular upper body clothing?: A Little Help from another person to put on and taking off regular lower body clothing?: A Little 6 Click Score: 18   End of Session Equipment Utilized During Treatment: Rolling walker Nurse Communication: Mobility status  Activity Tolerance: Patient tolerated treatment well Patient left: in chair;with call bell/phone within reach;with chair alarm set  OT Visit Diagnosis: Other abnormalities of gait and mobility (R26.89)                Time: 1324-1340 OT Time Calculation (min): 16 min Charges:  OT General Charges $OT Visit: 1 Visit OT Evaluation $OT Eval Low Complexity: 1 Low  Delbert Phenix OT Pager: [place] 03/31/2020, 2:51 PM

## 2020-04-01 LAB — URINALYSIS, ROUTINE W REFLEX MICROSCOPIC
Bilirubin Urine: NEGATIVE
Glucose, UA: 50 mg/dL — AB
Hgb urine dipstick: NEGATIVE
Ketones, ur: NEGATIVE mg/dL
Leukocytes,Ua: NEGATIVE
Nitrite: NEGATIVE
Protein, ur: NEGATIVE mg/dL
Specific Gravity, Urine: 1.015 (ref 1.005–1.030)
pH: 6 (ref 5.0–8.0)

## 2020-04-01 LAB — BPAM RBC
Blood Product Expiration Date: 202108072359
Blood Product Expiration Date: 202108082359
ISSUE DATE / TIME: 202107141404
ISSUE DATE / TIME: 202107152045
Unit Type and Rh: 5100
Unit Type and Rh: 5100

## 2020-04-01 LAB — CBC WITH DIFFERENTIAL/PLATELET
Abs Immature Granulocytes: 0.01 10*3/uL (ref 0.00–0.07)
Basophils Absolute: 0 10*3/uL (ref 0.0–0.1)
Basophils Relative: 1 %
Eosinophils Absolute: 0.1 10*3/uL (ref 0.0–0.5)
Eosinophils Relative: 3 %
HCT: 25.2 % — ABNORMAL LOW (ref 39.0–52.0)
Hemoglobin: 7.3 g/dL — ABNORMAL LOW (ref 13.0–17.0)
Immature Granulocytes: 0 %
Lymphocytes Relative: 36 %
Lymphs Abs: 1.6 10*3/uL (ref 0.7–4.0)
MCH: 21.8 pg — ABNORMAL LOW (ref 26.0–34.0)
MCHC: 29 g/dL — ABNORMAL LOW (ref 30.0–36.0)
MCV: 75.2 fL — ABNORMAL LOW (ref 80.0–100.0)
Monocytes Absolute: 0.3 10*3/uL (ref 0.1–1.0)
Monocytes Relative: 7 %
Neutro Abs: 2.3 10*3/uL (ref 1.7–7.7)
Neutrophils Relative %: 53 %
Platelets: 232 10*3/uL (ref 150–400)
RBC: 3.35 MIL/uL — ABNORMAL LOW (ref 4.22–5.81)
RDW: 21.1 % — ABNORMAL HIGH (ref 11.5–15.5)
WBC: 4.4 10*3/uL (ref 4.0–10.5)
nRBC: 0 % (ref 0.0–0.2)

## 2020-04-01 LAB — GLUCOSE, CAPILLARY
Glucose-Capillary: 116 mg/dL — ABNORMAL HIGH (ref 70–99)
Glucose-Capillary: 107 mg/dL — ABNORMAL HIGH (ref 70–99)
Glucose-Capillary: 177 mg/dL — ABNORMAL HIGH (ref 70–99)
Glucose-Capillary: 147 mg/dL — ABNORMAL HIGH (ref 70–99)
Glucose-Capillary: 181 mg/dL — ABNORMAL HIGH (ref 70–99)

## 2020-04-01 LAB — TYPE AND SCREEN
ABO/RH(D): O POS
Antibody Screen: NEGATIVE
Unit division: 0
Unit division: 0

## 2020-04-01 LAB — HEMOGLOBIN AND HEMATOCRIT, BLOOD
HCT: 22.9 % — ABNORMAL LOW (ref 39.0–52.0)
HCT: 30.1 % — ABNORMAL LOW (ref 39.0–52.0)
HCT: 25.7 % — ABNORMAL LOW (ref 39.0–52.0)
Hemoglobin: 6.8 g/dL — CL (ref 13.0–17.0)
Hemoglobin: 8.7 g/dL — ABNORMAL LOW (ref 13.0–17.0)
Hemoglobin: 7.7 g/dL — ABNORMAL LOW (ref 13.0–17.0)

## 2020-04-01 LAB — BASIC METABOLIC PANEL
Anion gap: 7 (ref 5–15)
BUN: 40 mg/dL — ABNORMAL HIGH (ref 8–23)
CO2: 28 mmol/L (ref 22–32)
Calcium: 8.9 mg/dL (ref 8.9–10.3)
Chloride: 104 mmol/L (ref 98–111)
Creatinine, Ser: 1.16 mg/dL (ref 0.61–1.24)
GFR calc Af Amer: >60 mL/min (ref >60)
GFR calc non Af Amer: 58 mL/min — ABNORMAL LOW (ref >60)
Glucose, Bld: 128 mg/dL — ABNORMAL HIGH (ref 70–99)
Potassium: 4.6 mmol/L (ref 3.5–5.1)
Sodium: 139 mmol/L (ref 135–145)

## 2020-04-01 MED ORDER — LEVOTHYROXINE SODIUM 100 MCG PO TABS
100.0000 ug | ORAL_TABLET | Freq: Every day | ORAL | Status: DC
  Administered 2020-04-02 – 2020-04-05 (×4): 100 ug via ORAL
  Filled 2020-04-01 (×4): qty 1

## 2020-04-01 NOTE — Plan of Care (Signed)
  Problem: Education: Goal: Knowledge of General Education information will improve Description: Including pain rating scale, medication(s)/side effects and non-pharmacologic comfort measures Outcome: Progressing   Problem: Health Behavior/Discharge Planning: Goal: Ability to manage health-related needs will improve Outcome: Progressing   Problem: Clinical Measurements: Goal: Ability to maintain clinical measurements within normal limits will improve Outcome: Progressing Goal: Will remain free from infection Outcome: Progressing Goal: Diagnostic test results will improve Outcome: Progressing Goal: Respiratory complications will improve Outcome: Progressing Goal: Cardiovascular complication will be avoided Outcome: Progressing   Problem: Activity: Goal: Risk for activity intolerance will decrease Outcome: Progressing   Problem: Nutrition: Goal: Adequate nutrition will be maintained Outcome: Progressing   Problem: Coping: Goal: Level of anxiety will decrease Outcome: Progressing   Problem: Elimination: Goal: Will not experience complications related to bowel motility Outcome: Progressing Goal: Will not experience complications related to urinary retention Outcome: Progressing   Problem: Pain Managment: Goal: General experience of comfort will improve Outcome: Progressing   Problem: Safety: Goal: Ability to remain free from injury will improve Outcome: Progressing   Problem: Skin Integrity: Goal: Risk for impaired skin integrity will decrease Outcome: Progressing   Problem: Education: Goal: Ability to identify signs and symptoms of gastrointestinal bleeding will improve Outcome: Progressing   Problem: Bowel/Gastric: Goal: Will show no signs and symptoms of gastrointestinal bleeding Outcome: Progressing   Problem: Fluid Volume: Goal: Will show no signs and symptoms of excessive bleeding Outcome: Progressing   Problem: Clinical Measurements: Goal:  Complications related to the disease process, condition or treatment will be avoided or minimized Outcome: Progressing   Problem: Education: Goal: Knowledge of General Education information will improve Description: Including pain rating scale, medication(s)/side effects and non-pharmacologic comfort measures Outcome: Progressing   Problem: Health Behavior/Discharge Planning: Goal: Ability to manage health-related needs will improve Outcome: Progressing   Problem: Clinical Measurements: Goal: Ability to maintain clinical measurements within normal limits will improve Outcome: Progressing Goal: Will remain free from infection Outcome: Progressing Goal: Diagnostic test results will improve Outcome: Progressing Goal: Respiratory complications will improve Outcome: Progressing Goal: Cardiovascular complication will be avoided Outcome: Progressing   Problem: Activity: Goal: Risk for activity intolerance will decrease Outcome: Progressing   Problem: Nutrition: Goal: Adequate nutrition will be maintained Outcome: Progressing   Problem: Coping: Goal: Level of anxiety will decrease Outcome: Progressing   Problem: Elimination: Goal: Will not experience complications related to bowel motility Outcome: Progressing Goal: Will not experience complications related to urinary retention Outcome: Progressing   Problem: Pain Managment: Goal: General experience of comfort will improve Outcome: Progressing   Problem: Safety: Goal: Ability to remain free from injury will improve Outcome: Progressing   Problem: Skin Integrity: Goal: Risk for impaired skin integrity will decrease Outcome: Progressing

## 2020-04-01 NOTE — Progress Notes (Signed)
PROGRESS NOTE  JANTZEN PILGER BPZ:[id number] DOB: 1934-11-10 DOA: 03/30/2020 PCP: Biagio Borg, MD  Brief History   This patient is an 85 yr old man who was admitted by my colleague, Dr. Hal Hope earlier this morning for suspected lower GI bleed due to frank rectal bleeding. He was transfused with one unit PRBC's at admission due to hemoglobin at presentation of 6.6. Hemoglobin 6 months ago was 12.4. Will monitor Hemoglobin and hematocrit. Consult GI. He has been started on protonix. He has a history of chronic gastritis. FOBT positive. He was given a transfusion of 1 unit PRBC's.  His creatinine was elevated over his baseline of 1.1 at 1.66. Monitor creatinine, electrolytes, and volume status. Avoid nephrotoxins and hypotension.  Potassium was low at 2.7. Supplement and monitor.  He carries a past medical history significant for CKD, chronic gastritis, CKD, DDD, DM II, GERD, Hyperlipidemia, hypertension, hypothyroidism, Iron deficiency anemia, peripheral neuropathy, prostate cancer, restless leg syndrome, peptic ulcer disease.  The patient was admitted to a telemetry bed. GI was consulted and it was determined that the family did not desire for the patient to undergo invasive procedure such as EGD. GI has signed off. The patient's diet will be advanced, and his hemoglobin will be monitored. He was continued on daily PPI.   On 03/31/2020 his hemoglobin dropped to 6.7 again. He will receive 1 unit PRBC's in transfusion.  Consultants   Gastroenterology  Procedures   None  Antibiotics   Anti-infectives (From admission, onward)   None     Subjective  The patient is resting quietly. No new complaints. He is in good spirits.  Objective   Vitals:  Vitals:   04/01/20 1020 04/01/20 1344  BP: (!) 112/55 (!) 108/58  Pulse: 74 74  Resp: 16 18  Temp: (!) 97.5 F (36.4 C) 97.6 F (36.4 C)  SpO2: 91% 100%   Exam:  Constitutional:   The patient is awake, alert, and oriented x  3. No acute distress. Respiratory:   No increased work of breathing.  No wheezes, rales, or rhonchi  No tactile fremitus Cardiovascular:   Regular rate and rhythm  No murmurs, ectopy, or gallups.  No lateral PMI. No thrills. Abdomen:   Abdomen is soft, non-tender, non-distended  No hernias, masses, or organomegaly  Normoactive bowel sounds.  Musculoskeletal:   No cyanosis, clubbing, or edema Skin:   No rashes, lesions, ulcers  palpation of skin: no induration or nodules Neurologic:   CN 2-12 intact  Sensation all 4 extremities intact  I have personally reviewed the following:   Today's Data   Vitals, BMP, CBC, H&H  Scheduled Meds:  sodium chloride   Intravenous Once   insulin aspart  0-6 Units Subcutaneous Q4H   [START ON 04/02/2020] levothyroxine  100 mcg Oral QAC breakfast   QUEtiapine  25 mg Oral BID   Continuous Infusions:  Principal Problem:   Acute GI bleeding Active Problems:   Hypertension   Dementia with behavioral disturbance (HCC)   CKD (chronic kidney disease) stage 3, GFR 30-59 ml/min   Hypothyroidism   PUD (peptic ulcer disease)   Acute blood loss anemia   LOS: 2 days   A & P  Acute GI bleed -suspect patient likely has lower GI bleed given the patient has frank rectal bleeding.  Patient is presently hemodynamically stable.  Does take baby aspirin.  Not on any other anticoagulants.  Patient was started empirically on Protonix.  1 unit of PRBC transfusion has been ordered.  Will consult GI in the morning.  Patient has had EGD in 2019 which showed diffuse mildly erythematous mucosa of the gastric fundus and mild inflammation of the duodenum.  Colonoscopy in 2004 showed some diverticulosis.  Patient did say about abdominal pain abdomen appears benign.  The patient was admitted to a med surg bed. GI was consulted. They have discussed the patient with his family. The family states that they do not wish invasive studies. The patient's diet will  be advanced as tolerated, and he will be continued on his PPI. This afternoon the patient's hemoglobin dropped to 6.7 after being stable in the 7.5 range for the last day. He will receive another unit of blood in transfusion. Hgb this am was still only 7.3. Continue to monitor.  Acute blood loss anemia for which patient has received s unit of PRBC transfusion. Continue to monitor hemoglobin. Re-consult GI for evidence of frank and brisk bleeding.  B12 deficiency: Continue the patient on oral B12 supplements.  Acute renal failure with hypokalemia could be from poor oral intake -replace potassium and patient is receiving PRBC transfusion recheck metabolic panel. Creatinine improved from 1.66 to 1.15. Monitor.  Diabetes mellitus type 2 we will keep patient on sliding scale coverage for now. 116 - 188.  Hypothyroidism we will change to IV Synthroid and change to p.o. once patient can take orally.  History of dementia recently started on Seroquel and Xanax.  Family was planning to get further work-up on dementia through the primary care with neurologist.  I have seen and examined this patient myself. I have spent 32 minutes in his evaluation and care.  DVT Prophylaxis: SCD's CODE STATUS: DNR Family Communication: None available. Disposition: Patient is from home. Anticipate discharge to home. Barriers to discharge - unstable hemoglobin.  Status is: Inpatient  Remains inpatient appropriate because:Inpatient level of care appropriate due to severity of illness   Dispo: The patient is from: Home              Anticipated d/c is to: Home              Anticipated d/c date is: 1 day              Patient currently is not medically stable to d/c.  Juanitta Earnhardt, DO [facility] Direct contact: see [email or web address]  7PM-7AM contact night coverage as above 04/01/2020, 4:43 PM  LOS: 1 day

## 2020-04-01 NOTE — TOC Progression Note (Signed)
Transition of Care [facility]) - Progression Note    Patient Details  Name: Roger Hanson MRN: [id number] Date of Birth: 24-Apr-1935  [facility] CM/SW Contact  Sandar Krinke, [name], RN Phone Number: 04/01/2020, 4:05 PM  Clinical Narrative:Spoke to dtr michele-agree to SNF-faxed out await pasrr, & bed offers. MD to sign 30 day note in shadow chart.       Expected Discharge Plan: [place] Barriers to Discharge: Continued Medical Work up  Expected Discharge Plan and Services Expected Discharge Plan: [place]   Discharge Planning Services: CM Consult Post Acute Care Choice: [place] Living arrangements for the past 2 months: Single Family Home                                       Social Determinants of Health (SDOH) Interventions    Readmission Risk Interventions No flowsheet data found.

## 2020-04-01 NOTE — NC FL2 (Signed)
[place] LEVEL OF CARE SCREENING TOOL     IDENTIFICATION  Patient Name: Roger Hanson Birthdate: [birth year] Sex: male Admission Date (Current Location): 03/30/2020  [facility] and [place] Number:  Herbalist and Address:  [facility],  [place] [address], [place]      Provider Number: [id number]  Attending Physician Name and Address:  Karie Kirks, DO  Relative Name and Phone Number:  Charolette Child [phone]    Current Level of Care: Hospital Recommended Level of Care: [place] Prior Approval Number:    Date Approved/Denied:   PASRR Number:    Discharge Plan: SNF    Current Diagnoses: Patient Active Problem List   Diagnosis Date Noted  . Acute GI bleeding 03/30/2020  . Acute blood loss anemia 03/30/2020  . Hypokalemia   . Peripheral edema 12/10/2019  . B12 deficiency 07/16/2019  . Urinary frequency 07/16/2019  . Fever 07/16/2019  . Weight loss 07/16/2019  . Left rotator cuff tear 05/06/2019  . Left shoulder pain 04/20/2019  . CKD (chronic kidney disease) stage 3, GFR 30-59 ml/min 04/17/2019  . GERD (gastroesophageal reflux disease) 04/17/2019  . HLD (hyperlipidemia) 04/17/2019  . Chronic insomnia 04/17/2019  . Hypothyroidism 04/17/2019  . RLS (restless legs syndrome) 04/17/2019  . Arthritis of knee, degenerative 04/17/2019  . Lumbar disc disease 04/17/2019  . PUD (peptic ulcer disease) 04/17/2019  . Anxiety 04/17/2019  . Iron deficiency anemia 04/17/2019  . Peripheral neuropathy 04/17/2019  . Chronic gastritis 04/17/2019  . Vitamin D deficiency 04/17/2019  . Psoriasis 04/17/2019  . Hypertension 04/07/2019  . Dementia with behavioral disturbance ([place]) 04/07/2019  . Preventative health care 04/07/2019  . Hypovolemia   . AKI (acute kidney injury) ([place]) 08/07/2017  . Enteritis 08/07/2013  . Acute renal failure ([place]) 08/07/2013  . Diabetes mellitus ([place]) 08/07/2013  . Prostate cancer ([place])  02/04/2012  . Mixed incontinence 02/04/2012    Orientation RESPIRATION BLADDER Height & Weight     Self  Normal Incontinent Weight: 51.9 kg Height:  5' 8.5" (174 cm)  BEHAVIORAL SYMPTOMS/MOOD NEUROLOGICAL BOWEL NUTRITION STATUS      Incontinent Diet (Soft)  AMBULATORY STATUS COMMUNICATION OF NEEDS Skin   Limited Assist Verbally Normal                       Personal Care Assistance Level of Assistance  Bathing, Feeding, Dressing Bathing Assistance: Limited assistance Feeding assistance: Limited assistance Dressing Assistance: Limited assistance     Functional Limitations Info  Sight, Hearing, Speech Sight Info: Impaired (eyeglasses) Hearing Info: Adequate Speech Info: Adequate    SPECIAL CARE FACTORS FREQUENCY  PT (By licensed PT), OT (By licensed OT)     PT Frequency: 5x week OT Frequency: 5x week            Contractures Contractures Info: Not present    Additional Factors Info  Code Status, Allergies, Psychotropic Code Status Info: DNR Allergies Info: glipizide,lovastatin,omeprazole,protonix Psychotropic Info: seroquel,xanax         Current Medications (04/01/2020):  This is the current hospital active medication list Current Facility-Administered Medications  Medication Dose Route Frequency Provider Last Rate Last Admin  . 0.9 %  sodium chloride infusion (Manually program via Guardrails IV Fluids)   Intravenous Once Swayze, Ava, DO      . ALPRAZolam (XANAX) tablet 0.25 mg  0.25 mg Oral Daily PRN Rise Patience, MD   0.25 mg at 04/01/20 0048  .  insulin aspart (novoLOG) injection 0-6 Units  0-6 Units Subcutaneous Q4H Rise Patience, MD   1 Units at 04/01/20 1226  . [START ON 04/02/2020] levothyroxine (SYNTHROID) tablet 100 mcg  100 mcg Oral QAC breakfast Lenis Noon, RPH      . ondansetron [facility]) tablet 4 mg  4 mg Oral Q6H PRN Rise Patience, MD       Or  . ondansetron [facility]) injection 4 mg  4 mg Intravenous Q6H PRN Rise Patience, MD      . QUEtiapine (SEROQUEL) tablet 25 mg  25 mg Oral BID Rise Patience, MD   25 mg at 04/01/20 7494     Discharge Medications: Please see discharge summary for a list of discharge medications.  Relevant Imaging Results:  Relevant Lab Results:   Additional Information ss#[id number]  Zara Wendt, [name], [place]

## 2020-04-01 NOTE — Progress Notes (Signed)
PHARMACIST - PHYSICIAN COMMUNICATION  DR:   Benny Lennert  CONCERNING: IV to Oral Route Change Policy  RECOMMENDATION: This patient is receiving levothyroxine by the intravenous route.  Based on criteria approved by the Pharmacy and Therapeutics Committee, the intravenous medication(s) is/are being converted to the equivalent oral dose form(s).   DESCRIPTION: These criteria include:  The patient is eating (either orally or via tube) and/or has been taking other orally administered medications for a least 24 hours  The patient has no evidence of active gastrointestinal bleeding or impaired GI absorption (gastrectomy, short bowel, patient on TNA or NPO).  Pt is taking quetiapine PO for >24 hrs. GI notes no ongoing evidence of bleeding and signed off.   If you have questions about this conversion, please contact the Pharmacy Department  []   [phone] )  Roger Hanson []   [phone] )  Roger Hanson []   [phone] )  Roger Hanson []   [phone] )  Roger Hanson [x]   [phone] )  Roger Hanson, Roger Hanson 04/01/2020 12:17 PM

## 2020-04-01 NOTE — TOC Initial Note (Signed)
Transition of Care [facility]) - Initial/Assessment Note    Patient Details  Name: Roger Hanson MRN: [id number] Date of Birth: 10-23-1934  Transition of Care [facility]) CM/SW Contact:    Roger Phi, RN Phone Number: 04/01/2020, 2:18 PM  Clinical Narrative: patient confused, unable to discuss d/cplans-left vm w/dtr Roger Hanson-await return call. Noted patient  's son who lives w/patient died in the home since admission. P-recc SNF-amb 198ft min guard. Will f/u once dtr has returned call.                  Expected Discharge Plan: Skilled Nursing Facility Barriers to Discharge: Continued Medical Work up   Patient Goals and CMS Choice Patient states their goals for this hospitalization and ongoing recovery are:: get well CMS Medicare.gov Compare Post Acute Care list provided to:: Patient Represenative (must comment) (dtr Roger Hanson) Choice offered to / list presented to : Adult Children  Expected Discharge Plan and Services Expected Discharge Plan: [place]   Discharge Planning Services: CM Consult Post Acute Care Choice: [place] Living arrangements for the past 2 months: Single Family Home                                      Prior Living Arrangements/Services Living arrangements for the past 2 months: Single Family Home Lives with:: Adult Children Patient language and need for interpreter reviewed:: Yes Do you feel safe going back to the place where you live?: Yes      Need for Family Participation in Patient Care: No (Comment) Care giver support system in place?: Yes (comment)   Criminal Activity/Legal Involvement Pertinent to Current Situation/Hospitalization: No - Comment as needed  Activities of Daily Living Home Assistive Devices/Equipment: Cane (specify quad or straight), Eyeglasses, Walker (specify type) (rolling walker and straight cane) ADL Screening (condition at time of admission) Patient's cognitive ability adequate to safely  complete daily activities?: Yes Is the patient deaf or have difficulty hearing?: No Does the patient have difficulty seeing, even when wearing glasses/contacts?: No Does the patient have difficulty concentrating, remembering, or making decisions?: Yes Patient able to express need for assistance with ADLs?: Yes Does the patient have difficulty dressing or bathing?: No Independently performs ADLs?: Yes (appropriate for developmental age) Does the patient have difficulty walking or climbing stairs?: No Weakness of Legs: Left Weakness of Arms/Hands: None  Permission Sought/Granted Permission sought to share information with : Case Manager Permission granted to share information with : Yes, Verbal Permission Granted  Share Information with NAME: Case Manager           Emotional Assessment Appearance:: Appears stated age Attitude/Demeanor/Rapport: Gracious Affect (typically observed): Accepting Orientation: : Oriented to Self Alcohol / Substance Use: Not Applicable Psych Involvement: No (comment)  Admission diagnosis:  Hypokalemia [E87.6] Acute GI bleeding [K92.2] Anemia, blood loss [D50.0] Patient Active Problem List   Diagnosis Date Noted   Acute GI bleeding 03/30/2020   Acute blood loss anemia 03/30/2020   Hypokalemia    Peripheral edema 12/10/2019   B12 deficiency 07/16/2019   Urinary frequency 07/16/2019   Fever 07/16/2019   Weight loss 07/16/2019   Left rotator cuff tear 05/06/2019   Left shoulder pain 04/20/2019   CKD (chronic kidney disease) stage 3, GFR 30-59 ml/min 04/17/2019   GERD (gastroesophageal reflux disease) 04/17/2019   HLD (hyperlipidemia) 04/17/2019   Chronic insomnia 04/17/2019   Hypothyroidism 04/17/2019   RLS (restless  legs syndrome) 04/17/2019   Arthritis of knee, degenerative 04/17/2019   Lumbar disc disease 04/17/2019   PUD (peptic ulcer disease) 04/17/2019   Anxiety 04/17/2019   Iron deficiency anemia 04/17/2019    Peripheral neuropathy 04/17/2019   Chronic gastritis 04/17/2019   Vitamin D deficiency 04/17/2019   Psoriasis 04/17/2019   Hypertension 04/07/2019   Dementia with behavioral disturbance ([place]) 04/07/2019   Preventative health care 04/07/2019   Hypovolemia    AKI (acute kidney injury) ([place]) 08/07/2017   Enteritis 08/07/2013   Acute renal failure ([place]) 08/07/2013   Diabetes mellitus ([place]) 08/07/2013   Prostate cancer ([place]) 02/04/2012   Mixed incontinence 02/04/2012   PCP:  Roger Borg, MD Pharmacy:   [facility] [phone] - [name], [name] [address] [place] [address] Phone: [phone] Fax: [phone]     Social Determinants of Health (SDOH) Interventions    Readmission Risk Interventions No flowsheet data found.

## 2020-04-02 LAB — GLUCOSE, CAPILLARY
Glucose-Capillary: 116 mg/dL — ABNORMAL HIGH (ref 70–99)
Glucose-Capillary: 119 mg/dL — ABNORMAL HIGH (ref 70–99)
Glucose-Capillary: 156 mg/dL — ABNORMAL HIGH (ref 70–99)
Glucose-Capillary: 233 mg/dL — ABNORMAL HIGH (ref 70–99)
Glucose-Capillary: 262 mg/dL — ABNORMAL HIGH (ref 70–99)
Glucose-Capillary: 97 mg/dL (ref 70–99)

## 2020-04-02 LAB — HEMOGLOBIN AND HEMATOCRIT, BLOOD
HCT: 27 % — ABNORMAL LOW (ref 39.0–52.0)
HCT: 28.4 % — ABNORMAL LOW (ref 39.0–52.0)
HCT: 23.5 % — ABNORMAL LOW (ref 39.0–52.0)
Hemoglobin: 8.1 g/dL — ABNORMAL LOW (ref 13.0–17.0)
Hemoglobin: 8.3 g/dL — ABNORMAL LOW (ref 13.0–17.0)
Hemoglobin: 7 g/dL — ABNORMAL LOW (ref 13.0–17.0)

## 2020-04-02 LAB — BPAM RBC
Blood Product Expiration Date: 202108072359
ISSUE DATE / TIME: 202107141404
Unit Type and Rh: 5100

## 2020-04-02 LAB — TYPE AND SCREEN
ABO/RH(D): O POS
Antibody Screen: NEGATIVE
Unit division: 0

## 2020-04-02 MED ORDER — LORAZEPAM 2 MG/ML IJ SOLN
0.5000 mg | Freq: Once | INTRAMUSCULAR | Status: AC
  Administered 2020-04-02: 0.5 mg via INTRAVENOUS
  Filled 2020-04-02: qty 1

## 2020-04-02 MED ORDER — LORAZEPAM 2 MG/ML IJ SOLN
0.5000 mg | Freq: Four times a day (QID) | INTRAMUSCULAR | Status: DC | PRN
  Administered 2020-04-02 – 2020-04-04 (×3): 0.5 mg via INTRAVENOUS
  Filled 2020-04-02 (×3): qty 1

## 2020-04-02 NOTE — Clinical Social Work Note (Signed)
30 Day Passar Note  RE: Standley Bargo    Date of Birth: Mar 30, 1935    Date:04/02/2020      To Whom It May Concern:  Please be advised that the above-named patient will require a short-term nursing home stay - anticipated 30 days or less for rehabilitation and strengthening.  The plan is for return home.   Evette Cristal, MSW, Marlinda Mike [phone]

## 2020-04-02 NOTE — Progress Notes (Signed)
PROGRESS NOTE  LEVIN DAGOSTINO TAV:[id number] DOB: 1934-11-24 DOA: 03/30/2020 PCP: Biagio Borg, MD  Brief History   This patient is an 85 yr old man who was admitted by my colleague, Dr. Hal Hope earlier this morning for suspected lower GI bleed due to frank rectal bleeding. He was transfused with one unit PRBC's at admission due to hemoglobin at presentation of 6.6. Hemoglobin 6 months ago was 12.4. Will monitor Hemoglobin and hematocrit. Consult GI. He has been started on protonix. He has a history of chronic gastritis. FOBT positive. He was given a transfusion of 1 unit PRBC's.  His creatinine was elevated over his baseline of 1.1 at 1.66. Monitor creatinine, electrolytes, and volume status. Avoid nephrotoxins and hypotension.  Potassium was low at 2.7. Supplement and monitor.  He carries a past medical history significant for CKD, chronic gastritis, CKD, DDD, DM II, GERD, Hyperlipidemia, hypertension, hypothyroidism, Iron deficiency anemia, peripheral neuropathy, prostate cancer, restless leg syndrome, peptic ulcer disease.  It seems that the patient was initially in the ED with his son who was taken to the ED with a GI bleed due to esophageal varices. The patient left the ED and the patient's daughter took his place at the patient's son's side in the ED. The son hemorrhaged and died in the ED. The patient could not be reached by phone. He was found down at home and was brought back to the ED. He still does not know that his son has died. Given this patient's dementia, I will leave this to the rest of his family.  The patient was admitted to a telemetry bed. GI was consulted and it was determined that the family did not desire for the patient to undergo invasive procedure such as EGD. GI has signed off. The patient's diet will be advanced, and his hemoglobin will be monitored. He was continued on daily PPI.   On 03/31/2020 his hemoglobin dropped to 6.7 again. He received 1 unit PRBC's in  transfusion. Since the second unit, his hemoglobin has largely been stable.   Consultants  . Gastroenterology  Procedures  . None  Antibiotics   Anti-infectives (From admission, onward)   None     Subjective  The patient is resting quietly. No new complaints.  Objective   Vitals:  Vitals:   04/02/20 0438 04/02/20 1216  BP: (!) 153/81 126/63  Pulse: 95 89  Resp: 18   Temp: 97.6 F (36.4 C)   SpO2: 100% 100%   Exam:  Constitutional:  . The patient is awake, alert, and oriented x 3. No acute distress. Respiratory:  . No increased work of breathing. . No wheezes, rales, or rhonchi . No tactile fremitus Cardiovascular:  . Regular rate and rhythm . No murmurs, ectopy, or gallups. . No lateral PMI. No thrills. Abdomen:  . Abdomen is soft, non-tender, non-distended . No hernias, masses, or organomegaly . Normoactive bowel sounds.  Musculoskeletal:  . No cyanosis, clubbing, or edema Skin:  . No rashes, lesions, ulcers . palpation of skin: no induration or nodules Neurologic:  . CN 2-12 intact . Sensation all 4 extremities intact  I have personally reviewed the following:   Today's Data  . Vitals, H&H  Scheduled Meds: . sodium chloride   Intravenous Once  . insulin aspart  0-6 Units Subcutaneous Q4H  . levothyroxine  100 mcg Oral QAC breakfast  . QUEtiapine  25 mg Oral BID   Continuous Infusions:  Principal Problem:   Acute GI bleeding Active Problems:  Hypertension   Dementia with behavioral disturbance (HCC)   CKD (chronic kidney disease) stage 3, GFR 30-59 ml/min   Hypothyroidism   PUD (peptic ulcer disease)   Acute blood loss anemia   LOS: 3 days   A & P  Acute GI bleed -suspect patient likely has lower GI bleed given the patient has frank rectal bleeding.  Patient is presently hemodynamically stable.  Does take baby aspirin.  Not on any other anticoagulants.  Patient was started empirically on Protonix.  1 unit of PRBC transfusion has been  ordered.  Will consult GI in the morning.  Patient has had EGD in 2019 which showed diffuse mildly erythematous mucosa of the gastric fundus and mild inflammation of the duodenum.  Colonoscopy in 2004 showed some diverticulosis.  Patient did say about abdominal pain abdomen appears benign.  The patient was admitted to a med surg bed. GI was consulted. They have discussed the patient with his family. The family states that they do not wish invasive studies. The patient's diet will be advanced as tolerated, and he will be continued on his PPI. This afternoon the patient's hemoglobin dropped to 6.7 after being stable in the 7.5 range for the last day. He will receive another unit of blood in transfusion. Hgb this am was up to 8.1. Continue to monitor.  Acute blood loss anemia for which patient has received s unit of PRBC transfusion. Continue to monitor hemoglobin. Re-consult GI for evidence of frank and brisk bleeding. Will continue to monitor H&H.  B12 deficiency: Continue the patient on oral B12 supplements.  Acute renal failure with hypokalemia could be from poor oral intake -replace potassium and patient is receiving PRBC transfusion recheck metabolic panel. Creatinine improved from 1.66 to 1.15. Monitor.  Diabetes mellitus type 2 we will keep patient on sliding scale coverage for now. 97 - 262.  Hypothyroidism we will change to IV Synthroid and change to p.o. once patient can take orally.  History of dementia recently started on Seroquel and Xanax.  Family was planning to get further work-up on dementia through the primary care with neurologist.  I have seen and examined this patient myself. I have spent 36 minutes in his evaluation and care. More than 50% of this was spent in counseling with the patient's daughter.   DVT Prophylaxis: SCD's CODE STATUS: DNR Family Communication: I have discussed the patient in detail with his daughter Sharyn Lull Disposition: Patient is from home. Anticipate  discharge to home. Barriers to discharge - no safe discharge. Status is: Inpatient  Remains inpatient appropriate because:Inpatient level of care appropriate due to severity of illness   Dispo: The patient is from: Home              Anticipated d/c is to: Home              Anticipated d/c date is: 1 day              Patient currently is medically stable for discharge.  Zayin Valadez, DO [facility] Direct contact: see [email or web address]  7PM-7AM contact night coverage as above 04/02/2020, 2:36 PM  LOS: 1 day

## 2020-04-03 LAB — GLUCOSE, CAPILLARY
Glucose-Capillary: 107 mg/dL — ABNORMAL HIGH (ref 70–99)
Glucose-Capillary: 123 mg/dL — ABNORMAL HIGH (ref 70–99)
Glucose-Capillary: 133 mg/dL — ABNORMAL HIGH (ref 70–99)
Glucose-Capillary: 151 mg/dL — ABNORMAL HIGH (ref 70–99)
Glucose-Capillary: 163 mg/dL — ABNORMAL HIGH (ref 70–99)
Glucose-Capillary: 199 mg/dL — ABNORMAL HIGH (ref 70–99)

## 2020-04-03 LAB — HEMOGLOBIN AND HEMATOCRIT, BLOOD
HCT: 30.5 % — ABNORMAL LOW (ref 39.0–52.0)
HCT: 24.9 % — ABNORMAL LOW (ref 39.0–52.0)
Hemoglobin: 8.9 g/dL — ABNORMAL LOW (ref 13.0–17.0)
Hemoglobin: 7.1 g/dL — ABNORMAL LOW (ref 13.0–17.0)

## 2020-04-03 NOTE — Plan of Care (Signed)
  Problem: Education: Goal: Knowledge of General Education information will improve Description: Including pain rating scale, medication(s)/side effects and non-pharmacologic comfort measures Outcome: Progressing   Problem: Health Behavior/Discharge Planning: Goal: Ability to manage health-related needs will improve Outcome: Progressing   Problem: Clinical Measurements: Goal: Ability to maintain clinical measurements within normal limits will improve Outcome: Progressing Goal: Will remain free from infection Outcome: Progressing Goal: Diagnostic test results will improve Outcome: Progressing Goal: Respiratory complications will improve Outcome: Progressing Goal: Cardiovascular complication will be avoided Outcome: Progressing   Problem: Activity: Goal: Risk for activity intolerance will decrease Outcome: Progressing   Problem: Nutrition: Goal: Adequate nutrition will be maintained Outcome: Progressing   Problem: Coping: Goal: Level of anxiety will decrease Outcome: Progressing   Problem: Elimination: Goal: Will not experience complications related to bowel motility Outcome: Progressing Goal: Will not experience complications related to urinary retention Outcome: Progressing   Problem: Pain Managment: Goal: General experience of comfort will improve Outcome: Progressing   Problem: Safety: Goal: Ability to remain free from injury will improve Outcome: Progressing   Problem: Skin Integrity: Goal: Risk for impaired skin integrity will decrease Outcome: Progressing   Problem: Education: Goal: Ability to identify signs and symptoms of gastrointestinal bleeding will improve Outcome: Progressing   Problem: Bowel/Gastric: Goal: Will show no signs and symptoms of gastrointestinal bleeding Outcome: Progressing   Problem: Fluid Volume: Goal: Will show no signs and symptoms of excessive bleeding Outcome: Progressing   Problem: Clinical Measurements: Goal:  Complications related to the disease process, condition or treatment will be avoided or minimized Outcome: Progressing   Problem: Education: Goal: Knowledge of General Education information will improve Description: Including pain rating scale, medication(s)/side effects and non-pharmacologic comfort measures Outcome: Progressing   Problem: Health Behavior/Discharge Planning: Goal: Ability to manage health-related needs will improve Outcome: Progressing   Problem: Clinical Measurements: Goal: Ability to maintain clinical measurements within normal limits will improve Outcome: Progressing Goal: Will remain free from infection Outcome: Progressing Goal: Diagnostic test results will improve Outcome: Progressing Goal: Respiratory complications will improve Outcome: Progressing Goal: Cardiovascular complication will be avoided Outcome: Progressing   Problem: Activity: Goal: Risk for activity intolerance will decrease Outcome: Progressing   Problem: Nutrition: Goal: Adequate nutrition will be maintained Outcome: Progressing   Problem: Coping: Goal: Level of anxiety will decrease Outcome: Progressing   Problem: Elimination: Goal: Will not experience complications related to bowel motility Outcome: Progressing Goal: Will not experience complications related to urinary retention Outcome: Progressing   Problem: Pain Managment: Goal: General experience of comfort will improve Outcome: Progressing   Problem: Safety: Goal: Ability to remain free from injury will improve Outcome: Progressing   Problem: Skin Integrity: Goal: Risk for impaired skin integrity will decrease Outcome: Progressing

## 2020-04-03 NOTE — Progress Notes (Signed)
PROGRESS NOTE  Roger Hanson IEP:[id number] DOB: [birth year] DOA: 03/30/2020 PCP: Biagio Borg, MD  Brief History   This patient is an 85 yr old man who was admitted by my colleague, Dr. Hal Hope earlier this morning for suspected lower GI bleed due to frank rectal bleeding. He was transfused with one unit PRBC's at admission due to hemoglobin at presentation of 6.6. Hemoglobin 6 months ago was 12.4. Will monitor Hemoglobin and hematocrit. Consult GI. He has been started on protonix. He has a history of chronic gastritis. FOBT positive. He was given a transfusion of 1 unit PRBC's.  He carries a past medical history significant for CKD, chronic gastritis, CKD, DDD, DM II, GERD, Hyperlipidemia, hypertension, hypothyroidism, Iron deficiency anemia, peripheral neuropathy, prostate cancer, restless leg syndrome, peptic ulcer disease.  It seems that the patient was initially in the ED with his son who was taken to the ED with a GI bleed due to esophageal varices. The patient left the ED and the patient's daughter took his place at the patient's son's side in the ED. The son hemorrhaged and died in the ED. The patient could not be reached by phone. He was found down at home and was brought back to the ED. He still does not know that his son has died. Given this patient's dementia, I will leave this to the rest of his family.  The patient was admitted to a telemetry bed. GI was consulted and it was determined that the family did not desire for the patient to undergo invasive procedure such as EGD. GI has signed off. The patient's diet will be advanced, and his hemoglobin will be monitored. He was continued on daily PPI. His creatinine was elevated over his baseline of 1.1 at 1.66.  On 03/31/2020 his hemoglobin dropped to 6.7 again. He received 1 unit PRBC's in transfusion. Since the second unit, his hemoglobin has largely been stable.   Consultants   Gastroenterology  Procedures    None  Antibiotics   Anti-infectives (From admission, onward)   None     Subjective  The patient is resting quietly. No new complaints. Nursing relates that the patient had a large dark BM this morning.  Objective   Vitals:  Vitals:   04/03/20 0517 04/03/20 1231  BP: (!) 149/89 (!) 115/55  Pulse: 90 82  Resp: 20 16  Temp: (!) 97.5 F (36.4 C) 97.9 F (36.6 C)  SpO2: 99% 100%   Exam:  Constitutional:   The patient is awake, alert, and oriented x 3. No acute distress. Respiratory:   No increased work of breathing.  No wheezes, rales, or rhonchi  No tactile fremitus Cardiovascular:   Regular rate and rhythm  No murmurs, ectopy, or gallups.  No lateral PMI. No thrills. Abdomen:   Abdomen is soft, non-tender, non-distended  No hernias, masses, or organomegaly  Normoactive bowel sounds.  Musculoskeletal:   No cyanosis, clubbing, or edema Skin:   No rashes, lesions, ulcers  palpation of skin: no induration or nodules Neurologic:   CN 2-12 intact  Sensation all 4 extremities intact  I have personally reviewed the following:   Today's Data   Vitals, H&H  Scheduled Meds:  sodium chloride   Intravenous Once   insulin aspart  0-6 Units Subcutaneous Q4H   levothyroxine  100 mcg Oral QAC breakfast   QUEtiapine  25 mg Oral BID   Continuous Infusions:  Principal Problem:   Acute GI bleeding Active Problems:   Hypertension   Dementia  with behavioral disturbance (HCC)   CKD (chronic kidney disease) stage 3, GFR 30-59 ml/min   Hypothyroidism   PUD (peptic ulcer disease)   Acute blood loss anemia   LOS: 4 days   A & P  Acute GI bleed -suspect patient likely has lower GI bleed given the patient has frank rectal bleeding.  Patient is presently hemodynamically stable.  Does take baby aspirin.  Not on any other anticoagulants.  Patient was started empirically on Protonix.  1 unit of PRBC transfusion has been ordered.  Will consult GI in the  morning.  Patient has had EGD in 2019 which showed diffuse mildly erythematous mucosa of the gastric fundus and mild inflammation of the duodenum.  Colonoscopy in 2004 showed some diverticulosis.  Patient did say about abdominal pain abdomen appears benign.  The patient was admitted to a med surg bed. GI was consulted. They have discussed the patient with his family. The family states that they do not wish invasive studies. The patient's diet will be advanced as tolerated, and he will be continued on his PPI. This afternoon the patient's hemoglobin dropped to 6.7 after being stable in the 7.5 range for the last day. He will receive another unit of blood in transfusion. Hgb this am was up to 8.9 despite reports of a large dark BM. Continue to monitor.  Acute blood loss anemia for which patient has received s unit of PRBC transfusion. Continue to monitor hemoglobin. Re-consult GI for evidence of frank and brisk bleeding. Hgb this am was up to 8.9 despite reports of a large dark BM. Will continue to monitor H&H.  B12 deficiency: Continue the patient on oral B12 supplements.  Acute renal failure with hypokalemia could be from poor oral intake -replace potassium and patient is receiving PRBC transfusion recheck metabolic panel. Creatinine improved from 1.66 to 1.15. Monitor.  Diabetes mellitus type 2 we will keep patient on sliding scale coverage for now. 97 - 262.  Hypothyroidism we will change to IV Synthroid and change to p.o. once patient can take orally.  History of dementia recently started on Seroquel and Xanax.  Family was planning to get further work-up on dementia through the primary care with neurologist.  I have seen and examined this patient myself. I have spent 32 minutes in his evaluation and care.  DVT Prophylaxis: SCD's CODE STATUS: DNR Family Communication: None available Disposition: Patient is from home. Anticipate discharge to home. Barriers to discharge - no safe  discharge. Status is: Inpatient  Remains inpatient appropriate because:Inpatient level of care appropriate due to severity of illness   Dispo: The patient is from: Home              Anticipated d/c is to: Home              Anticipated d/c date is: 1 day              Patient currently is medically stable for discharge.  Mischell Branford, DO [facility] Direct contact: see [email or web address]  7PM-7AM contact night coverage as above 04/03/2020, 1:30 PM  LOS: 1 day

## 2020-04-04 LAB — HEMOGLOBIN AND HEMATOCRIT, BLOOD
HCT: 24.6 % — ABNORMAL LOW (ref 39.0–52.0)
HCT: 28 % — ABNORMAL LOW (ref 39.0–52.0)
HCT: 28.2 % — ABNORMAL LOW (ref 39.0–52.0)
HCT: 29.5 % — ABNORMAL LOW (ref 39.0–52.0)
Hemoglobin: 7.3 g/dL — ABNORMAL LOW (ref 13.0–17.0)
Hemoglobin: 8.1 g/dL — ABNORMAL LOW (ref 13.0–17.0)
Hemoglobin: 8.1 g/dL — ABNORMAL LOW (ref 13.0–17.0)
Hemoglobin: 8.6 g/dL — ABNORMAL LOW (ref 13.0–17.0)

## 2020-04-04 LAB — GLUCOSE, CAPILLARY
Glucose-Capillary: 114 mg/dL — ABNORMAL HIGH (ref 70–99)
Glucose-Capillary: 149 mg/dL — ABNORMAL HIGH (ref 70–99)
Glucose-Capillary: 149 mg/dL — ABNORMAL HIGH (ref 70–99)
Glucose-Capillary: 234 mg/dL — ABNORMAL HIGH (ref 70–99)
Glucose-Capillary: 150 mg/dL — ABNORMAL HIGH (ref 70–99)

## 2020-04-04 LAB — BASIC METABOLIC PANEL
Anion gap: 10 (ref 5–15)
BUN: 30 mg/dL — ABNORMAL HIGH (ref 8–23)
CO2: 23 mmol/L (ref 22–32)
Calcium: 8.8 mg/dL — ABNORMAL LOW (ref 8.9–10.3)
Chloride: 104 mmol/L (ref 98–111)
Creatinine, Ser: 0.98 mg/dL (ref 0.61–1.24)
GFR calc Af Amer: >60 mL/min (ref >60)
GFR calc non Af Amer: >60 mL/min (ref >60)
Glucose, Bld: 173 mg/dL — ABNORMAL HIGH (ref 70–99)
Potassium: 4 mmol/L (ref 3.5–5.1)
Sodium: 137 mmol/L (ref 135–145)

## 2020-04-04 MED ORDER — SODIUM CHLORIDE 0.9 % IV SOLN: INTRAVENOUS | Status: DC

## 2020-04-04 MED ORDER — ALUM & MAG HYDROXIDE-SIMETH 200-200-20 MG/5ML PO SUSP
30.0000 mL | Freq: Four times a day (QID) | ORAL | Status: DC | PRN
  Administered 2020-04-05 – 2020-04-07 (×3): 30 mL via ORAL
  Filled 2020-04-04 (×3): qty 30

## 2020-04-04 MED ORDER — PANTOPRAZOLE SODIUM 40 MG PO TBEC
40.0000 mg | DELAYED_RELEASE_TABLET | Freq: Two times a day (BID) | ORAL | Status: DC
  Administered 2020-04-04 – 2020-04-08 (×8): 40 mg via ORAL
  Filled 2020-04-04 (×8): qty 1

## 2020-04-04 MED ORDER — PANTOPRAZOLE SODIUM 40 MG PO TBEC
40.0000 mg | DELAYED_RELEASE_TABLET | Freq: Every day | ORAL | Status: DC
  Administered 2020-04-04: 40 mg via ORAL
  Filled 2020-04-04: qty 1

## 2020-04-04 NOTE — Care Management (Cosign Needed)
Transition of Care (TOC) -30 day Note          Patient Details   Name: Roger Hanson  OZY:[id number]  Date of Birth:1935/03/24     Transition of Care [facility]) CM/SW Contact   Name:Brooklee Michelin [facility]  Phone Number:[phone]  Date:04/04/2020  Time:9:28a     MUST [id number]     To Whom it May Concern:     Please be advised that the above patient will require a short-term nursing home stay, anticipated 30 days or less rehabilitation and strengthening. The plan is for return home.

## 2020-04-04 NOTE — Progress Notes (Signed)
PALLIATIVE NOTE:  Chart Reviewed. Patient assessed at the bedside. Daughter is at the bedside offering support. In introduced myself and Palliative's role in her father's care while hospitalized. Daughter verbalized understanding and appreciation.   Patient somewhat agitated. Alert and awake expressing feelings of burning and indigestion. Daughter reports patient generally takes Protonix or Nexium. Shares longstanding issues with indigestion causing him nausea and increased agitation due to discomfort.   Discussed with Dr. Benny Lennert with plans to restart Protonix and/or Mylanta.   Request made for goals of care discussion tomorrow 04/05/20 @12pm . Daughter aware I will meet her at the patient's bedside.   Detailed note and recommendation to follow.   Alda Lea, AGPCNP-BC Palliative Medicine Team  Phone: [phone]  NO CHARGE

## 2020-04-04 NOTE — TOC Progression Note (Signed)
Transition of Care [facility]) - Progression Note    Patient Details  Name: Roger Hanson MRN: [id number] Date of Birth: 06/24/1935  Transition of Care [facility]) CM/SW Contact  Christella App, [name], RN Phone Number: 04/04/2020, 12:14 PM  Clinical Narrative:Bed offers given to dtr [name]-await choice, & Pasrr.       Expected Discharge Plan: [place] Barriers to Discharge: Continued Medical Work up  Expected Discharge Plan and Services Expected Discharge Plan: [place]   Discharge Planning Services: CM Consult Post Acute Care Choice: [place] Living arrangements for the past 2 months: Single Family Home                                       Social Determinants of Health (SDOH) Interventions    Readmission Risk Interventions No flowsheet data found.

## 2020-04-04 NOTE — Progress Notes (Addendum)
Paged MD to make aware of no urine output since 1115, bladder scan showed <28ml, see new orders

## 2020-04-04 NOTE — TOC Progression Note (Signed)
Transition of Care [facility]) - Progression Note    Patient Details  Name: Roger Hanson MRN: [id number] Date of Birth: Jul 03, 1935  Transition of Care [facility]) CM/SW Contact  Letonya Mangels, [name], RN Phone Number: 04/04/2020, 11:55 AM  Clinical Narrative:   1. 1.7 mi [place] at [place], [place] [address] [phone] Overall rating Below average 2. 2 mi [facility] at the [place], [place] [address] [phone] Overall rating Below average 3. 2.1 mi [place] at [place], [place] [address] [phone] Overall rating Much below average 4. 2.2 mi [place] A [employer] [employer] [place], [place] [address] [phone] Overall rating Much above average 5. 2.2 mi [place], [place] [address] [phone] Overall rating Average 6. 2.7 [place], [place] [address] [phone] Overall rating Above average 7. 3.2 mi [place] [address] [place], [place] [address] [phone] Overall rating Below average 8. 3.3 [place] [address] [place], [place] [address] [phone] Overall rating Below average 9. 3.5 mi [facility] [place], [place] [address] [phone] Overall rating Average 10. 4.3 [place], [place] [address] [phone] Overall rating Below average 11. 4.7 mi [place] at [place], [place] [address] [phone] Overall rating Much above average 12. 5.2 mi [facility] [address] [place], [place] [address] [phone] Overall rating Much above  average 13. 6.2 mi [place], [place] [address] [phone] Overall rating Average 14. 8.1 [place], [place] [address] [phone] Overall rating Above average 15. 8.1 mi [facility] and [place], [place] [address] [phone] Overall rating Much below average  16. 9.7 mi The [facility] [address] [place], [place] [address] [phone] Overall rating Below average 17. [place] [address] [place], [place] [address] [phone] Overall rating Much above average 18. 11.4 mi [place] at [facility] [address] [place], [place] [address] [phone] Overall rating Much above average 19. 13.5 [facility] [address] [place], [place] [address] [phone] Overall rating Much below average 20. 13.5 mi [facility] [address] [place], [place] [address] [phone] Overall rating Much below average 21. 14.5 [place] [address] [place], [place] [address] [phone] Overall rating Much below average 22. 14.9 mi The [place] [facility] [address] [place], [place] [address] [phone] Overall rating Much below average 23. 15.1 mi [facility] at [place], [place] [address] [phone] Overall rating Below average 24. 15.3 mi [place] and [facility] [place], [place] [address] [phone] Overall rating Above average 25. [place], [place] [address] [phone] Overall rating Much above average 26. 16.2 [place], [place] [address] [phone] Overall rating Much above average 27. 16.6 mi [place] [address] [place], [place]  [address] [phone] Overall rating Below average 28. 17.2 mi [facility] N[facility]r [place], [place] [address] [phone] Overall rating Below average 29. 73.2 [facility] [address] [place], [place] [address] [phone] Overall rating Much below average 30. 19.4 mi [address] at [employer] at [facility], [place] [address] [phone] Overall rating Much above average 31. 20.6 mi [facility] and [facility] [address] [place], [place] [address] [phone] Overall rating Below average 32. 20.7 [place], [place] [address] [phone] Overall rating Average 33. 89.3 [facility] [address] [place], [place] [address] [phone] Overall rating Below average 34. 21.1 [place], [place] [address] [phone] Overall rating Much above average 35. [place] [address] [place], [place] [address] [phone] Overall rating Below average 36. 22.4 [place], [place] [address] [phone] Overall rating Below average 37. 22.7 mi [facility] [place] [address] [place], [place] [address] [phone] Overall rating Above average 38. 22.8 [address] [place], [place] [address] [phone] Overall rating Much above average 39. 22.9 [place], [place] [address] [phone] Overall rating Average 40. [place], [place] [address] [phone] Overall rating Much below average 41. 24.4 mi [facility] and [facility] [address] [place], [place] [address] [phone] Overall rating Much below average 42. 24.6 [facility] [address] [place], [place] [address] [phone] Overall rating Much below average 43. 24.6 mi [place], [place] [address] [phone] Overall rating Much below average 44. 24.9 mi [place], [place] [address] [phone] Overall rating Much below average    Expected Discharge Plan: [place] Barriers to Discharge: Continued Medical Work up  Expected Discharge Plan and Services Expected Discharge Plan: [place]   Discharge Planning Services: CM Consult Post Acute Care Choice: [place] arrangements for the past 2 months: Single Family Home                                       Social Determinants of Health (SDOH) Interventions    Readmission Risk Interventions No flowsheet data found.

## 2020-04-04 NOTE — Care Management Important Message (Signed)
Important Message  Patient Details IM Letter given to Roger Phi RN Case Manager to present to the Patient Name: Roger Hanson MRN: [id number] Date of Birth: [birth year]   Medicare Important Message Given:  Yes     [name] 04/04/2020, 12:21 PM

## 2020-04-04 NOTE — Progress Notes (Addendum)
Physical Therapy Treatment Patient Details Name: Roger Hanson MRN: [id number] DOB: 01-08-1935 Today's Date: 04/04/2020    History of Present Illness Roger Hanson is a 85 y.o. male PMH significant for  GERD, diverticulosis. dementia, hypothyroidism, prostate cancer, hyperlipidemia, DM, CKD III, brought to Ed for GIB.    PT Comments    Pt seemed more distracted, restless/fidgety on today. He followed 1 step commands inconsistently. He required increased assistance on today. Continue to recommend SNF.    Follow Up Recommendations  SNF     Equipment Recommendations  None recommended by PT    Recommendations for Other Services       Precautions / Restrictions Precautions Precautions: Fall Restrictions Weight Bearing Restrictions: No    Mobility  Bed Mobility Overal bed mobility: Needs Assistance Bed Mobility: Rolling;Supine to Sit;Sit to Supine Rolling: Mod assist   Supine to sit: Mod assist;+2 for physical assistance;+2 for safety/equipment Sit to supine: Min assist+2 for physical assistance;+2 for safety/equipment   General bed mobility comments: Pt not following commands well on today. Increased assistance required for that reason.  Transfers Overall transfer level: Needs assistance Equipment used: Rolling walker (2 wheeled) Transfers: Sit to/from Stand Sit to Stand: Mod assist         General transfer comment: Assist to power up, stabilize, control descent. Multimodal cueing required. Increased time.  Ambulation/Gait Ambulation/Gait assistance: Min assist Gait Distance (Feet): 50 Feet Assistive device: Rolling walker (2 wheeled) Gait Pattern/deviations: Step-through pattern;Decreased stride length;Trunk flexed     General Gait Details: Very unsteady on today. Flexed trunk and knee posture. Multimodal cueing for posture, RW proximity. Pt appeared to fatigue easily with ambulation on today. Recliner brought out for pt to sit down and for transport back to  room.   Stairs             Wheelchair Mobility    Modified Rankin (Stroke Patients Only)       Balance Overall balance assessment: Needs assistance         Standing balance support: Bilateral upper extremity supported Standing balance-Leahy Scale: Poor                              Cognition Arousal/Alertness: Awake/alert Behavior During Therapy: Restless Overall Cognitive Status: History of cognitive impairments - at baseline                                 General Comments: fidgety today      Exercises      General Comments        Pertinent Vitals/Pain Pain Assessment: Faces Faces Pain Scale: No hurt    Home Living                      Prior Function            PT Goals (current goals can now be found in the care plan section) Progress towards PT goals: Progressing toward goals    Frequency    Min 2X/week      PT Plan Current plan remains appropriate    Co-evaluation              AM-PAC PT "6 Clicks" Mobility   Outcome Measure  Help needed turning from your back to your side while in a flat bed without using bedrails?: A Lot Help needed moving from lying  on your back to sitting on the side of a flat bed without using bedrails?: A Lot Help needed moving to and from a bed to a chair (including a wheelchair)?: A Lot Help needed standing up from a chair using your arms (e.g., wheelchair or bedside chair)?: A Lot Help needed to walk in hospital room?: A Lot Help needed climbing 3-5 steps with a railing? : Total 6 Click Score: 11    End of Session Equipment Utilized During Treatment: Gait belt Activity Tolerance: Patient limited by fatigue (limited by cognition) Patient left: in bed;with call bell/phone within reach;with bed alarm set;with nursing/sitter in room   PT Visit Diagnosis: Muscle weakness (generalized) (M62.81);Difficulty in walking, not elsewhere classified (R26.2)     Time:  7356-7014 PT Time Calculation (min) (ACUTE ONLY): 17 min  Charges:  $Gait Training: 8-22 mins                         Doreatha Massed, PT Acute Rehabilitation  Office: [phone] Pager: [phone]

## 2020-04-04 NOTE — Progress Notes (Signed)
PROGRESS NOTE  Roger Hanson BJY:[id number] DOB: April 02, 1935 DOA: 03/30/2020 PCP: Biagio Borg, MD  Brief History   This patient is an 85 yr old man who was admitted by my colleague, Dr. Hal Hope earlier this morning for suspected lower GI bleed due to frank rectal bleeding. He was transfused with one unit PRBC's at admission due to hemoglobin at presentation of 6.6. Hemoglobin 6 months ago was 12.4. Will monitor Hemoglobin and hematocrit. Consult GI. He has been started on protonix. He has a history of chronic gastritis. FOBT positive. He was given a transfusion of 1 unit PRBC's.  He carries a past medical history significant for CKD, chronic gastritis, CKD, DDD, DM II, GERD, Hyperlipidemia, hypertension, hypothyroidism, Iron deficiency anemia, peripheral neuropathy, prostate cancer, restless leg syndrome, peptic ulcer disease.  It seems that the patient was initially in the ED with his son who was taken to the ED with a GI bleed due to esophageal varices. The patient left the ED and the patient's daughter took his place at the patient's son's side in the ED. The son hemorrhaged and died in the ED. The patient could not be reached by phone. He was found down at home and was brought back to the ED. He still does not know that his son has died. Given this patient's dementia, I will leave this to the rest of his family.  The patient was admitted to a telemetry bed. GI was consulted and it was determined that the family did not desire for the patient to undergo invasive procedure such as EGD. GI has signed off. The patient's diet will be advanced, and his hemoglobin will be monitored. He was continued on daily PPI. His creatinine was elevated over his baseline of 1.1 at 1.66.  On 03/31/2020 his hemoglobin dropped to 6.7 again. He received 1 unit PRBC's in transfusion. Since the second unit, his hemoglobin has largely been stable.   The plan is for the patient to go to SNF. Palliative care has been  consulted.  Consultants   Gastroenterology  Palliative care  Procedures   None  Antibiotics   Anti-infectives (From admission, onward)   None     Subjective  The patient is resting quietly. No new complaints.  Objective   Vitals:  Vitals:   04/04/20 0500 04/04/20 1242  BP: 110/70 121/70  Pulse: (!) 101 98  Resp: 17 16  Temp: 98 F (36.7 C) 98.2 F (36.8 C)  SpO2: 100% 94%   Exam:  Constitutional:   The patient is awake, alert, and oriented x 3. No acute distress. Respiratory:   No increased work of breathing.  No wheezes, rales, or rhonchi  No tactile fremitus Cardiovascular:   Regular rate and rhythm  No murmurs, ectopy, or gallups.  No lateral PMI. No thrills. Abdomen:   Abdomen is soft, non-tender, non-distended  No hernias, masses, or organomegaly  Normoactive bowel sounds.  Musculoskeletal:   No cyanosis, clubbing, or edema Skin:   No rashes, lesions, ulcers  palpation of skin: no induration or nodules Neurologic:   CN 2-12 intact  Sensation all 4 extremities intact  I have personally reviewed the following:   Today's Data   Vitals, H&H, BMP  Scheduled Meds:  sodium chloride   Intravenous Once   insulin aspart  0-6 Units Subcutaneous Q4H   levothyroxine  100 mcg Oral QAC breakfast   pantoprazole  40 mg Oral Daily   QUEtiapine  25 mg Oral BID   Continuous Infusions:  Principal  Problem:   Acute GI bleeding Active Problems:   Hypertension   Dementia with behavioral disturbance (HCC)   CKD (chronic kidney disease) stage 3, GFR 30-59 ml/min   Hypothyroidism   PUD (peptic ulcer disease)   Acute blood loss anemia   LOS: 5 days   A & P  Acute GI bleed -suspect patient likely has lower GI bleed given the patient has frank rectal bleeding.  Patient is presently hemodynamically stable.  Does take baby aspirin.  Not on any other anticoagulants.  Patient was started empirically on Protonix.  1 unit of PRBC transfusion  has been ordered.  Will consult GI in the morning.  Patient has had EGD in 2019 which showed diffuse mildly erythematous mucosa of the gastric fundus and mild inflammation of the duodenum.  Colonoscopy in 2004 showed some diverticulosis.  Patient did say about abdominal pain abdomen appears benign.  The patient was admitted to a med surg bed. GI was consulted. They have discussed the patient with his family. The family states that they do not wish invasive studies. The patient's diet will be advanced as tolerated, and he will be continued on his PPI. This afternoon the patient's hemoglobin dropped to 6.7 after being stable in the 7.5 range for the last day. He will receive another unit of blood in transfusion. Hgb this am was up to 8.1. Continue to monitor.  Acute blood loss anemia for which patient has received s unit of PRBC transfusion. Continue to monitor hemoglobin. Re-consult GI for evidence of frank and brisk bleeding. Hgb this am was up to 8.1 this morning. Will continue to monitor H&H.  B12 deficiency: Continue the patient on oral B12 supplements.  Acute renal failure with hypokalemia could be from poor oral intake -replace potassium and patient is receiving PRBC transfusion recheck metabolic panel. Creatinine improved from 1.66 to 0.98. Monitor.  Diabetes mellitus type 2 we will keep patient on sliding scale coverage for now. 149 - 234.  Hypothyroidism we will change to IV Synthroid and change to p.o. once patient can take orally.  History of dementia recently started on Seroquel and Xanax.  Family was planning to get further work-up on dementia through the primary care with neurologist.  I have seen and examined this patient myself. I have spent 35 minutes in his evaluation and care.  DVT Prophylaxis: SCD's CODE STATUS: DNR Family Communication: I have discussed the patient in detail with his daughter, [name]. Disposition: Patient is from home. Anticipate discharge to SNF. Barriers to  discharge - no safe discharge. Status is: Inpatient  Remains inpatient appropriate because:Inpatient level of care appropriate due to severity of illness   Dispo: The patient is from: Home              Anticipated d/c is to: SNF              Anticipated d/c date is: 1 day              Patient currently is medically stable for discharge.  Helen Winterhalter, DO [facility] Direct contact: see [email or web address]  7PM-7AM contact night coverage as above 04/04/2020, 5:05 PM  LOS: 1 day

## 2020-04-04 NOTE — Care Management (Signed)
Transition of Care (TOC) -30 day Note          Patient Details   Name: Roger Hanson  YWV:[id number]  Date of Birth:Feb 06, 1935     Transition of Care [facility]) CM/SW Contact   Name:Durrel Mcnee [facility]   Phone Number:[phone]  Date:04/04/2020  Time:9:27a     MUST TE:[id number]     To Whom it May Concern:     Please be advised that the above patient will require a short-term nursing home stay, anticipated 30 days or less rehabilitation and strengthening. The plan is for return home.

## 2020-04-04 NOTE — Plan of Care (Signed)
  Problem: Education: Goal: Knowledge of General Education information will improve Description: Including pain rating scale, medication(s)/side effects and non-pharmacologic comfort measures Outcome: Progressing   Problem: Health Behavior/Discharge Planning: Goal: Ability to manage health-related needs will improve Outcome: Progressing   Problem: Clinical Measurements: Goal: Ability to maintain clinical measurements within normal limits will improve Outcome: Progressing Goal: Will remain free from infection Outcome: Progressing Goal: Diagnostic test results will improve Outcome: Progressing Goal: Respiratory complications will improve Outcome: Progressing Goal: Cardiovascular complication will be avoided Outcome: Progressing   Problem: Activity: Goal: Risk for activity intolerance will decrease Outcome: Progressing   Problem: Nutrition: Goal: Adequate nutrition will be maintained Outcome: Progressing   Problem: Coping: Goal: Level of anxiety will decrease Outcome: Progressing   Problem: Elimination: Goal: Will not experience complications related to bowel motility Outcome: Progressing Goal: Will not experience complications related to urinary retention Outcome: Progressing   Problem: Pain Managment: Goal: General experience of comfort will improve Outcome: Progressing   Problem: Safety: Goal: Ability to remain free from injury will improve Outcome: Progressing   Problem: Skin Integrity: Goal: Risk for impaired skin integrity will decrease Outcome: Progressing   Problem: Education: Goal: Ability to identify signs and symptoms of gastrointestinal bleeding will improve Outcome: Progressing   Problem: Bowel/Gastric: Goal: Will show no signs and symptoms of gastrointestinal bleeding Outcome: Progressing   Problem: Fluid Volume: Goal: Will show no signs and symptoms of excessive bleeding Outcome: Progressing   Problem: Clinical Measurements: Goal:  Complications related to the disease process, condition or treatment will be avoided or minimized Outcome: Progressing   Problem: Education: Goal: Knowledge of General Education information will improve Description: Including pain rating scale, medication(s)/side effects and non-pharmacologic comfort measures Outcome: Progressing   Problem: Health Behavior/Discharge Planning: Goal: Ability to manage health-related needs will improve Outcome: Progressing   Problem: Clinical Measurements: Goal: Ability to maintain clinical measurements within normal limits will improve Outcome: Progressing Goal: Will remain free from infection Outcome: Progressing Goal: Diagnostic test results will improve Outcome: Progressing Goal: Respiratory complications will improve Outcome: Progressing Goal: Cardiovascular complication will be avoided Outcome: Progressing   Problem: Activity: Goal: Risk for activity intolerance will decrease Outcome: Progressing   Problem: Nutrition: Goal: Adequate nutrition will be maintained Outcome: Progressing   Problem: Coping: Goal: Level of anxiety will decrease Outcome: Progressing   Problem: Elimination: Goal: Will not experience complications related to bowel motility Outcome: Progressing Goal: Will not experience complications related to urinary retention Outcome: Progressing   Problem: Pain Managment: Goal: General experience of comfort will improve Outcome: Progressing   Problem: Safety: Goal: Ability to remain free from injury will improve Outcome: Progressing   Problem: Skin Integrity: Goal: Risk for impaired skin integrity will decrease Outcome: Progressing

## 2020-04-05 DIAGNOSIS — I10 Essential (primary) hypertension: Secondary | ICD-10-CM

## 2020-04-05 DIAGNOSIS — Z66 Do not resuscitate: Secondary | ICD-10-CM

## 2020-04-05 DIAGNOSIS — Z7189 Other specified counseling: Secondary | ICD-10-CM

## 2020-04-05 DIAGNOSIS — N183 Chronic kidney disease, stage 3 unspecified: Secondary | ICD-10-CM

## 2020-04-05 DIAGNOSIS — K279 Peptic ulcer, site unspecified, unspecified as acute or chronic, without hemorrhage or perforation: Secondary | ICD-10-CM

## 2020-04-05 LAB — CBC
HCT: 25.5 % — ABNORMAL LOW (ref 39.0–52.0)
Hemoglobin: 7.3 g/dL — ABNORMAL LOW (ref 13.0–17.0)
MCH: 22.5 pg — ABNORMAL LOW (ref 26.0–34.0)
MCHC: 28.6 g/dL — ABNORMAL LOW (ref 30.0–36.0)
MCV: 78.5 fL — ABNORMAL LOW (ref 80.0–100.0)
Platelets: 237 10*3/uL (ref 150–400)
RBC: 3.25 MIL/uL — ABNORMAL LOW (ref 4.22–5.81)
RDW: 21.8 % — ABNORMAL HIGH (ref 11.5–15.5)
WBC: 8.6 10*3/uL (ref 4.0–10.5)
nRBC: 0 % (ref 0.0–0.2)

## 2020-04-05 LAB — BASIC METABOLIC PANEL
Anion gap: 7 (ref 5–15)
BUN: 23 mg/dL (ref 8–23)
CO2: 25 mmol/L (ref 22–32)
Calcium: 9.1 mg/dL (ref 8.9–10.3)
Chloride: 109 mmol/L (ref 98–111)
Creatinine, Ser: 0.96 mg/dL (ref 0.61–1.24)
GFR calc Af Amer: >60 mL/min (ref >60)
GFR calc non Af Amer: >60 mL/min (ref >60)
Glucose, Bld: 169 mg/dL — ABNORMAL HIGH (ref 70–99)
Potassium: 3.9 mmol/L (ref 3.5–5.1)
Sodium: 141 mmol/L (ref 135–145)

## 2020-04-05 LAB — GLUCOSE, CAPILLARY
Glucose-Capillary: 133 mg/dL — ABNORMAL HIGH (ref 70–99)
Glucose-Capillary: 165 mg/dL — ABNORMAL HIGH (ref 70–99)
Glucose-Capillary: 193 mg/dL — ABNORMAL HIGH (ref 70–99)
Glucose-Capillary: 219 mg/dL — ABNORMAL HIGH (ref 70–99)

## 2020-04-05 MED ORDER — POLYVINYL ALCOHOL 1.4 % OP SOLN: 1.0000 [drp] | Freq: Four times a day (QID) | OPHTHALMIC | Status: DC | PRN

## 2020-04-05 MED ORDER — LORAZEPAM 2 MG/ML IJ SOLN: 0.5000 mg | INTRAMUSCULAR | Status: DC | PRN

## 2020-04-05 MED ORDER — BIOTENE DRY MOUTH MT LIQD: 15.0000 mL | OROMUCOSAL | Status: DC | PRN

## 2020-04-05 MED ORDER — MORPHINE SULFATE (PF) 2 MG/ML IV SOLN: 1.0000 mg | INTRAVENOUS | Status: DC | PRN

## 2020-04-05 MED ORDER — MORPHINE SULFATE (CONCENTRATE) 10 MG/0.5ML PO SOLN: 5.0000 mg | ORAL | Status: DC | PRN

## 2020-04-05 MED ORDER — GLYCOPYRROLATE 0.2 MG/ML IJ SOLN
0.2000 mg | INTRAMUSCULAR | Status: DC | PRN
  Filled 2020-04-05: qty 1

## 2020-04-05 NOTE — Progress Notes (Signed)
Occupational Therapy Treatment Patient Details Name: Roger Hanson MRN: [id number] DOB: 1935-02-23 Today's Date: 04/05/2020    History of present illness Roger Hanson is a 85 y.o. male PMH significant for  GERD, diverticulosis. dementia, hypothyroidism, prostate cancer, hyperlipidemia, DM, CKD III, brought to Ed for GIB.   OT comments  Patient requiring mod A for bed mobility, functional transfers and ambulation to/from bathroom with HHA x1. Patient with near LE scissoring ambulating back from bathroom requiring mod A for steadying. Upon standing from EOB patient reports "I have to go to the bathroom" then incontinent of bowel/bladder onto floor. Once transferred to toilet OT provide total A to wash feet, don clean socks and for peri care after bowel movement. Patient seated in recliner at end of session with meal tray. Continue to recommend 24/7 supervision/assistance at D/C due to decreased safety and requiring increased assist with functional transfers/self care.   Follow Up Recommendations  Supervision/Assistance - 24 hour;SNF    Equipment Recommendations  None recommended by OT       Precautions / Restrictions Precautions Precautions: Fall Restrictions Weight Bearing Restrictions: No       Mobility Bed Mobility Overal bed mobility: Needs Assistance Bed Mobility: Supine to Sit     Supine to sit: Mod assist;HOB elevated     General bed mobility comments: patient requiring mod cues to follow 1 step directions with assist lowering legs off EOB and to sit trunk upright  Transfers Overall transfer level: Needs assistance Equipment used: 1 person hand held assist Transfers: Sit to/from Stand Sit to Stand: Mod assist         General transfer comment: assist to power up to standing, stabilize and for eccentric control onto toilet and then recliner. multimodal cues for sequencing    Balance Overall balance assessment: Needs assistance Sitting-balance support: No upper  extremity supported;Feet supported Sitting balance-Leahy Scale: Fair     Standing balance support: Single extremity supported Standing balance-Leahy Scale: Poor Standing balance comment: decreased safety with standing balance requiring external support                           ADL either performed or assessed with clinical judgement   ADL Overall ADL's : Needs assistance/impaired Eating/Feeding: Set up;Sitting   Grooming: Wash/dry hands;Set up;Sitting       Lower Body Bathing: Total assistance;Sit to/from stand Lower Body Bathing Details (indicate cue type and reason): wash feet after incontinent stool/urine     Lower Body Dressing: Maximal assistance;Sitting/lateral leans Lower Body Dressing Details (indicate cue type and reason): patient require increased time and min A for balance/safety to don R sock. total A to doff soiled socks and don clean after incontinent bowel/bladder Toilet Transfer: Moderate assistance;Ambulation;Regular Toilet;Cueing for safety;Cueing for sequencing Toilet Transfer Details (indicate cue type and reason): hand held assistance x1, decreased balance with near LE scissoring on way back from bathroom to recliner Toileting- Clothing Manipulation and Hygiene: Total assistance;Sit to/from stand Toileting - Clothing Manipulation Details (indicate cue type and reason): peri care after bowel movement     Functional mobility during ADLs: Moderate assistance;Cueing for safety;Cueing for sequencing (hand held assist x1)                 Cognition Arousal/Alertness: Awake/alert Behavior During Therapy: WFL for tasks assessed/performed Overall Cognitive Status: History of cognitive impairments - at baseline  Pertinent Vitals/ Pain       Pain Assessment: Faces Faces Pain Scale: No hurt         Frequency  Min 2X/week        Progress Toward Goals  OT  Goals(current goals can now be found in the care plan section)  Progress towards OT goals: Not progressing toward goals - comment (patient requiring increased assistance this session)  Acute Rehab OT Goals Patient Stated Goal: "I need to go to the bathroom" OT Goal Formulation: With patient Time For Goal Achievement: 04/14/20 Potential to Achieve Goals: Good ADL Goals Pt Will Perform Grooming: with supervision;standing;sitting Pt Will Perform Lower Body Dressing: with supervision;sit to/from stand;sitting/lateral leans Pt Will Transfer to Toilet: with supervision;ambulating;regular height toilet (walker) Pt Will Perform Toileting - Clothing Manipulation and hygiene: with supervision;sit to/from stand;sitting/lateral leans  Plan Discharge plan remains appropriate       AM-PAC OT "6 Clicks" Daily Activity     Outcome Measure   Help from another person eating meals?: A Little Help from another person taking care of personal grooming?: A Little Help from another person toileting, which includes using toliet, bedpan, or urinal?: A Lot Help from another person bathing (including washing, rinsing, drying)?: Total Help from another person to put on and taking off regular upper body clothing?: A Little Help from another person to put on and taking off regular lower body clothing?: A Lot 6 Click Score: 14    End of Session  OT Visit Diagnosis: Other abnormalities of gait and mobility (R26.89)   Activity Tolerance Patient tolerated treatment well   Patient Left in chair;with call bell/phone within reach;with chair alarm set   Nurse Communication Mobility status        Time: 1610-9604 OT Time Calculation (min): 25 min  Charges: OT General Charges $OT Visit: 1 Visit OT Treatments $Self Care/Home Management : 23-37 mins  Delbert Phenix OT Pager: [place] 04/05/2020, 12:40 PM

## 2020-04-05 NOTE — Consult Note (Signed)
Consultation Note Date: 04/05/2020   Patient Name: Roger Hanson  DOB: Jan 19, 1935  MRN: [id number]  Age / Sex: 85 y.o., male   PCP: Biagio Borg, MD Referring Physician: Karie Kirks, DO   REASON FOR CONSULTATION:Establishing goals of care  Palliative Care consult requested for goals of care discussion in this 85 y.o. male with multiple medical problems including diabetes mellitus type 2, B12 deficiency, hypertension, hypothyroidism, dementia, hyperlipidemia, prostate cancer, GERD, anemia, and chronic kidney disease stage III. He presented to the ED from home with complaints of rectal bleeding, generalized weakness, and decreased mobility for several days prior to admission. During work-up hemoglobin was 6.6. Patient received 1unit of PRBC. Since admission patient has been evaluated by GI. Family has opted not to pursue endoscopy or further work-up.   Clinical Assessment and Goals of Care: I have reviewed medical records including lab results, imaging, Epic notes, and MAR, received report from the bedside RN, and assessed the patient. I met at the bedside with patient's daughter, Roger Hanson and her husband Roger Hanson to discuss diagnosis prognosis, GOC, EOL wishes, disposition and options.  Patient is sitting up in recliner asleep. He is easily aroused. Denies pain. Is not able to fully recognize daughter although he does seem somewhat familiar with her.   I introduced Palliative Medicine as specialized medical care for people living with serious illness. It focuses on providing relief from the symptoms and stress of a serious illness. The goal is to improve quality of life for both the patient and the family. Family verbalized understanding and appreciation.   We discussed a brief life review of the patient, along with his functional and nutritional status.Mr. Ledford served as a [place] [employer]. He is a retired [employer] and [place]. He enjoyed serving in the community and was  a [employer] for many years. He had 2 children (unfortunately patient's son suddenly passed away last 11-08-2022 due to bleeding esophageal varices/etoh cirrhosis) Patient is unaware of this event as it was the same time he became ill and transported to the hospital. His nickname was Doodlebug.   Daughter reports prior to admission patient had noticeable decreased appetite, decreased mobility, and increased signs of progressive dementia. Patient has lost some weight. Patient generally had a great appetite and loved cheese puffs and mashed potatoes, which he now shows no interest in. He would fall asleep when sitting, eating, or when walking requiring reminders and standby assist to prevent falls. He was requiring assistance with most ADLs.   We discussed His current illness and what it means in the larger context of His on-going co-morbidities. With specific discussions regarding his GI bleed, dementia, CKD, functional, and nutritional decline. Natural disease trajectory and expectations at EOL were discussed.  Family verbalized understanding. Daughter tearful expressing noticeable decline in her father. She does not want him to suffer and speaks to his now poor quality of life.   I attempted to elicit values and goals of care important to the patient.    The difference between aggressive medical intervention and comfort care was considered in light of the patient's goals of care. Family is aware of patient's co-morbidities. Daughter states she would like for her father's care to focus on his comfort for what time he has left. She and family are not interested in aggressive interventions or further work-up.   Discussed at length with family and education provided on what comfort care would look like for patient. Family verbalized understanding  and again confirms wishes for comfort care focus.   Family confirms wishes for DNR/DNI, no artificial feeding or hydration, and no dialysis.    Hospice Care services outpatient were explained and offered given expressed goals for comfort and no further work-up or medical interventions. Family verbalized their understanding and awareness of both hospice's goals and philosophy of care. They shared previous experience with other family members including Mr. Roger Hanson late wife.   Questions and concerns were addressed.The family was encouraged to call with questions or concerns.  PMT will continue to support holistically.   SOCIAL HISTORY:     reports that he has never smoked. He has never used smokeless tobacco. He reports that he does not drink alcohol and does not use drugs.  CODE STATUS: DNR  ADVANCE DIRECTIVES: Roger Hanson (Daughter/HCPOA)   SYMPTOM MANAGEMENT: see below   Palliative Prophylaxis:   Aspiration, Bowel Regimen, Delirium Protocol, Eye Care, Frequent Pain Assessment, Oral Care and Turn Reposition  PSYCHO-SOCIAL/SPIRITUAL:  Support System: Family  Desire for further Chaplaincy support:NO  Additional Recommendations (Limitations, Scope, Preferences):  Avoid Hospitalization, Full Comfort Care, Initiate Comfort Feeding, No Artificial Feeding, No Diagnostics and No Lab Draws  Education on hospice    PAST MEDICAL HISTORY: Past Medical History:  Diagnosis Date   Anxiety    Arthritis of knee, degenerative 04/17/2019   Chronic gastritis 04/17/2019   Chronic insomnia 04/17/2019   CKD (chronic kidney disease) stage 3, GFR 30-59 ml/min 04/17/2019   DDD (degenerative disc disease), lumbosacral    Diabetes mellitus without complication (HCC)    Diverticulosis    GERD (gastroesophageal reflux disease)    Hearing loss    HLD (hyperlipidemia) 04/17/2019   Hypercholesteremia    Hypertension    Hypothyroidism    Insomnia    Iron deficiency anemia 04/17/2019   Lumbar disc disease 04/17/2019   Lumbar radiculopathy    Peripheral neuropathy 04/17/2019   Prostate cancer (HCC)    Psoriasis     PUD (peptic ulcer disease)    Pulmonary nodules    Restless legs syndrome (RLS)    RLS (restless legs syndrome) 04/17/2019   Vitamin D deficiency 04/17/2019   Vitamin D deficiency disease     ALLERGIES:  is allergic to glipizide, lovastatin, omeprazole, and protonix [pantoprazole sodium].   MEDICATIONS:  Current Facility-Administered Medications  Medication Dose Route Frequency Provider Last Rate Last Admin   0.9 %  sodium chloride infusion (Manually program via Guardrails IV Fluids)   Intravenous Once Swayze, Ava, DO       0.9 %  sodium chloride infusion   Intravenous Continuous Swayze, Ava, DO 100 mL/hr at 04/05/20 0311 Rate Verify at 04/05/20 0311   ALPRAZolam (XANAX) tablet 0.25 mg  0.25 mg Oral Daily PRN Rise Patience, MD   0.25 mg at 04/01/20 0048   alum & mag hydroxide-simeth (MAALOX/MYLANTA) 200-200-20 MG/5ML suspension 30 mL  30 mL Oral Q6H PRN Swayze, Ava, DO   30 mL at 04/05/20 1159   insulin aspart (novoLOG) injection 0-6 Units  0-6 Units Subcutaneous Q4H Rise Patience, MD   1 Units at 04/05/20 1133   levothyroxine (SYNTHROID) tablet 100 mcg  100 mcg Oral QAC breakfast Lenis Noon, RPH   100 mcg at 04/05/20 1031   LORazepam (ATIVAN) injection 0.5 mg  0.5 mg Intravenous Q6H PRN Swayze, Ava, DO   0.5 mg at 04/04/20 1415   ondansetron (ZOFRAN) tablet 4 mg  4 mg Oral Q6H PRN Rise Patience, MD  Or   ondansetron (ZOFRAN) injection 4 mg  4 mg Intravenous Q6H PRN Rise Patience, MD   4 mg at 04/04/20 1216   pantoprazole (PROTONIX) EC tablet 40 mg  40 mg Oral BID Swayze, Ava, DO   40 mg at 04/05/20 0913   QUEtiapine (SEROQUEL) tablet 25 mg  25 mg Oral BID Rise Patience, MD   25 mg at 04/05/20 0913    VITAL SIGNS: BP 120/70 (BP Location: Left Arm)    Pulse 98    Temp 97.9 F (36.6 C) (Oral)    Resp 17    Ht 5' 8.5" (1.74 m)    Wt 51.9 kg    SpO2 100%    BMI 17.14 kg/m  Filed Weights   03/30/20 0633  Weight: 51.9 kg      Estimated body mass index is 17.14 kg/m as calculated from the following:   Height as of this encounter: 5' 8.5" (1.74 m).   Weight as of this encounter: 51.9 kg.  LABS: CBC:    Component Value Date/Time   WBC 8.6 04/05/2020 0717   HGB 7.3 (L) 04/05/2020 0717   HCT 25.5 (L) 04/05/2020 0717   PLT 237 04/05/2020 0717   Comprehensive Metabolic Panel:    Component Value Date/Time   NA 141 04/05/2020 0717   K 3.9 04/05/2020 0717   BUN 23 04/05/2020 0717   CREATININE 0.96 04/05/2020 0717   ALBUMIN 3.5 03/30/2020 0114     Review of Systems  Unable to perform ROS: Dementia   Physical Exam General: NAD, chronically-ill appearing Cardiovascular: regular rate and rhythm Pulmonary: diminished bilaterally  Abdomen: soft, nontender, + bowel sounds Extremities: no edema, no joint deformities Skin: no rashes, warm and dry Neurological: confused, able to follow simple commands   Prognosis: < 2 weeks in the setting of GI bleed (no further work-up) hemoglobin continues to decline (7.3), dementia progression, CKD, poor po intake, weight loss, anemia, diabetes, GERD, hypertension, prostate cancer.   Discharge Planning:  Hospice facility family requesting [facility].   Recommendations:  DNR/DNI-as confirmed by daughter/HCPOA  Transition all care to full comfort/EOL care as requested by family. No further work-up or aggressive interventions.   Will d/c all medical interventions and medications not comfort focused.   Residential hospice facility (family requesting [facility]) [facility] referral placed and communicated needs with Bevely [place], RN ([place]) Morphine PRN for pain/air hunger/comfort Robinul PRN for excessive secretions Ativan PRN for agitation/anxiety Zofran PRN for nausea Liquifilm tears PRN for dry eyes Haldol PRN for agitation/anxiety May have comfort feeding Comfort cart for family Unrestricted visitations in the setting of EOL (per policy) Oxygen PRN  2L or less for comfort. No escalation.    PMT will continue to support and follow. Please call team line with urgent needs.   Palliative Performance Scale: PPS 20%               Family Roger Hanson, daughter/HCPOA and Roger Hanson, son-in-law) expressed understanding and was in agreement with this plan.   Thank you for allowing the Palliative Medicine Team to assist in the care of this patient.  Time In: 1215 Time Out: 1320 Time Total: 65 min.   Visit consisted of counseling and education dealing with the complex and emotionally intense issues of symptom management and palliative care in the setting of serious and potentially life-threatening illness.Greater than 50%  of this time was spent counseling and coordinating care related to the above assessment and plan.  Signed by:  Alda Lea, AGPCNP-BC Palliative Medicine Team  Phone: [phone] Pager: [phone] Amion: Bjorn Pippin

## 2020-04-05 NOTE — TOC Progression Note (Signed)
Transition of Care [facility]) - Progression Note    Patient Details  Name: Roger Hanson MRN: [id number] Date of Birth: 04-10-1935  [facility]) CM/SW Contact  Jaselyn Nahm, [name], RN Phone Number: 04/05/2020, 2:14 PM  Clinical Narrative:  CM referral for residential hospice [facility] to eval-await outcome.     Expected Discharge Plan: [place] Barriers to Discharge: Continued Medical Work up  Expected Discharge Plan and Services Expected Discharge Plan: [place]   Discharge Planning Services: CM Consult Post Acute Care Choice: [place] Living arrangements for the past 2 months: Single Family Home                                       Social Determinants of Health (SDOH) Interventions    Readmission Risk Interventions No flowsheet data found.

## 2020-04-05 NOTE — Progress Notes (Signed)
[facility] ([facility]) Hospital Liaison note.     Received request from TOC manager for family interest in [facility]. [facility] is unable to offer a room today. Hospital Liaison will follow up tomorrow or sooner if a room becomes available.     A Please do not hesitate to call with questions.     Thank you,    Mary Anne Robertson, RN, CCM       [facility] Hospital Liaison (listed on AMION under Hospice /[facility])     [phone]  

## 2020-04-05 NOTE — Progress Notes (Signed)
PROGRESS NOTE  SOU NOHR UMP:[id number] DOB: 1935/03/14 DOA: 03/30/2020 PCP: Biagio Borg, MD  Brief History   This patient is an 85 yr old man who was admitted by my colleague, Dr. Hal Hope earlier this morning for suspected lower GI bleed due to frank rectal bleeding. He was transfused with one unit PRBC's at admission due to hemoglobin at presentation of 6.6. Hemoglobin 6 months ago was 12.4. Will monitor Hemoglobin and hematocrit. Consult GI. He has been started on protonix. He has a history of chronic gastritis. FOBT positive. He was given a transfusion of 1 unit PRBC's.  He carries a past medical history significant for CKD, chronic gastritis, CKD, DDD, DM II, GERD, Hyperlipidemia, hypertension, hypothyroidism, Iron deficiency anemia, peripheral neuropathy, prostate cancer, restless leg syndrome, peptic ulcer disease.  It seems that the patient was initially in the ED with his son who was taken to the ED with a GI bleed due to esophageal varices. The patient left the ED and the patient's daughter took his place at the patient's son's side in the ED. The son hemorrhaged and died in the ED. The patient could not be reached by phone. He was found down at home and was brought back to the ED. He still does not know that his son has died. Given this patient's dementia, I will leave this to the rest of his family.  The patient was admitted to a telemetry bed. GI was consulted and it was determined that the family did not desire for the patient to undergo invasive procedure such as EGD. GI has signed off. The patient's diet will be advanced, and his hemoglobin will be monitored. He was continued on daily PPI. His creatinine was elevated over his baseline of 1.1 at 1.66.  On 03/31/2020 his hemoglobin dropped to 6.7 again. He received 1 unit PRBC's in transfusion. Since the second unit, his hemoglobin has largely been stable.   The plan is for the patient to go to SNF. Palliative care has been  consulted. With their assistance the family has decided to transition the   Consultants  . Gastroenterology . Palliative care  Procedures  . None  Antibiotics   Anti-infectives (From admission, onward)   None     Subjective  The patient is resting quietly. No new complaints.  Objective   Vitals:  Vitals:   04/05/20 0500 04/05/20 1254  BP: 120/70 (!) 119/48  Pulse: 98 85  Resp: 17 18  Temp: 97.9 F (36.6 C) 98.2 F (36.8 C)  SpO2: 100% 100%   Exam:  Constitutional:  . The patient is awake, alert, and oriented x 3. No acute distress. Respiratory:  . No increased work of breathing. . No wheezes, rales, or rhonchi . No tactile fremitus Cardiovascular:  . Regular rate and rhythm . No murmurs, ectopy, or gallups. . No lateral PMI. No thrills. Abdomen:  . Abdomen is soft, non-tender, non-distended . No hernias, masses, or organomegaly . Normoactive bowel sounds.  Musculoskeletal:  . No cyanosis, clubbing, or edema Skin:  . No rashes, lesions, ulcers . palpation of skin: no induration or nodules Neurologic:  . CN 2-12 intact . Sensation all 4 extremities intact  I have personally reviewed the following:   Today's Data  . Vitals, H&H, BMP  Scheduled Meds: . pantoprazole  40 mg Oral BID  . QUEtiapine  25 mg Oral BID   Continuous Infusions:  Principal Problem:   Acute GI bleeding Active Problems:   Hypertension   Dementia with  behavioral disturbance (HCC)   CKD (chronic kidney disease) stage 3, GFR 30-59 ml/min   Hypothyroidism   PUD (peptic ulcer disease)   Acute blood loss anemia   LOS: 6 days   A & P  Acute GI bleed -suspect patient likely has lower GI bleed given the patient has frank rectal bleeding.  Patient is presently hemodynamically stable.  Does take baby aspirin.  Not on any other anticoagulants.  Patient was started empirically on Protonix.  1 unit of PRBC transfusion has been ordered.  Will consult GI in the morning.  Patient has had  EGD in 2019 which showed diffuse mildly erythematous mucosa of the gastric fundus and mild inflammation of the duodenum.  Colonoscopy in 2004 showed some diverticulosis.  Patient did say about abdominal pain abdomen appears benign.  The patient was admitted to a med surg bed. GI was consulted. They have discussed the patient with his family. The family states that they do not wish invasive studies. The patient's diet will be advanced as tolerated, and he will be continued on his PPI. This afternoon the patient's hemoglobin dropped to 6.7 after being stable in the 7.5 range for the last day. He will receive another unit of blood in transfusion. Hgb this am was down to 7.3. Continue to monitor.  Acute blood loss anemia for which patient has received s unit of PRBC transfusion. Continue to monitor hemoglobin. Re-consult GI for evidence of frank and brisk bleeding. Hgb this am was down to 7.3 this morning. Will continue to monitor H&H.  B12 deficiency: Continue the patient on oral B12 supplements.  Acute renal failure with hypokalemia could be from poor oral intake -replace potassium and patient is receiving PRBC transfusion recheck metabolic panel. Creatinine improved from 1.66 to 0.96. Monitor.  Diabetes mellitus type 2 we will keep patient on sliding scale coverage for now. 133 - 219.  Hypothyroidism we will change to IV Synthroid and change to p.o. once patient can take orally.  History of dementia recently started on Seroquel and Xanax.  Family was planning to get further work-up on dementia through the primary care with neurologist.  I have seen and examined this patient myself. I have spent 32 minutes in his evaluation and care.  DVT Prophylaxis: SCD's CODE STATUS: DNR Family Communication: I have discussed the patient in detail with his daughter, [name]. Disposition: Patient is from home. Anticipate discharge to SNF. Barriers to discharge - no safe discharge. Status is: Inpatient  Remains  inpatient appropriate because:Inpatient level of care appropriate due to severity of illness   Dispo: The patient is from: Home              Anticipated d/c is to: [place], Home with hospice              Anticipated d/c date is: 1 day              Patient currently is medically stable for discharge.  Status is: Inpatient  Remains inpatient appropriate because:Unsafe d/c plan   Dispo: The patient is from: Home              Anticipated d/c is to: Home with hospice              Anticipated d/c date is: 1 day              Patient currently is medically stable to d/c.        Roger Dogan, DO [facility] Direct contact:  see [email or web address]  7PM-7AM contact night coverage as above 04/04/2020, 5:05 PM  LOS: 1 day

## 2020-04-06 NOTE — TOC Progression Note (Signed)
Transition of Care [facility]) - Progression Note    Patient Details  Name: MALACKI MCPHEARSON MRN: [id number] Date of Birth: 1935/06/28  Transition of Care [facility]) CM/SW Contact  Bindu Docter, [name], RN Phone Number: 04/06/2020, 9:24 AM  Clinical Narrative: Per [facility] liason-Maryanne No bed currently will await later this afternoon.      Expected Discharge Plan: [place] Barriers to Discharge: Continued Medical Work up  Expected Discharge Plan and Services Expected Discharge Plan: [place]   Discharge Planning Services: CM Consult Post Acute Care Choice: [place] Living arrangements for the past 2 months: Single Family Home                                       Social Determinants of Health (SDOH) Interventions    Readmission Risk Interventions No flowsheet data found.

## 2020-04-06 NOTE — Progress Notes (Signed)
   Daily Progress Note   Patient Name: Roger Hanson       Date: 04/06/2020 DOB: 03-26-1935  Age: 85 y.o. MRN#: [id number] Attending Physician: Deatra James, MD Primary Care Physician: Biagio Borg, MD Admit Date: 03/30/2020  Reason for Consultation/Follow-up: Establishing goals of care  Subjective: Patient sitting up awake and alert to self only. Denies pain or shortness of breath. Daughter is at the bedside. Patient taking in some po nutrition today. Appetite remains poor however.   Goals remain clear and set for continued comfort care and hospice placement per family. Education provided on changes in mood and alertness in the setting of dementia.   All questions answered and support given.   Length of Stay: 7 days  Vital Signs: BP (!) 141/59 (BP Location: Left Arm)   Pulse 93   Temp 97.8 F (36.6 C) (Oral)   Resp 20   Ht 5' 8.5" (1.74 m)   Wt 51.9 kg   SpO2 100%   BMI 17.14 kg/m  SpO2: SpO2: 100 % O2 Device: O2 Device: Room Air O2 Flow Rate:    Physical Exam: -awake, alert to self -normal breathing pattern -will follow some commands            Palliative Care Assessment & Plan    Code Status:  DNR  Goals of Care/Recommendations:  Continue comfort care measures  Pending [facility] bed availability  PMT will continue to support and follow as needed. Please call team line with urgent needs   Prognosis: POOR   Discharge Planning: Hospice facility  Thank you for allowing the Palliative Medicine Team to assist in the care of this patient.  Time Total: 20 min.   Visit consisted of counseling and education dealing with the complex and emotionally intense issues of symptom management and palliative care in the setting of serious and potentially life-threatening illness.Greater than 50%  of this time was spent counseling and coordinating care related to the above assessment and plan.  Alda Lea, AGPCNP-BC  Palliative Medicine  Team [phone]

## 2020-04-06 NOTE — Progress Notes (Addendum)
PROGRESS NOTE  Roger Hanson YWV:[id number] DOB: 21-Sep-1934 DOA: 03/30/2020 PCP: Roger Borg, MD  Brief History   This patient is an 85 yr old man  for suspected lower GI bleed due to frank rectal bleeding. He was transfused with one unit PRBC's at admission due to hemoglobin of 6.6. Hemoglobin months ago was 12.4. Will monitor Hemoglobin and hematocrit. Consult GI. He has been started on protonix. He has a history of chronic gastritis. FOBT positive. He was given a transfusion of 1 unit PRBC's.  He carries a past medical history significant for CKD, chronic gastritis, CKD, DDD, DM II, GERD, Hyperlipidemia, hypertension, hypothyroidism, Iron deficiency anemia, peripheral neuropathy, prostate cancer, restless leg syndrome, peptic ulcer disease.  It seems that the patient was initially in the ED with his son who was taken to the ED with a GI bleed due to esophageal varices. The patient left the ED and the patient's daughter took his place at the patient's son's side in the ED. The son hemorrhaged and died in the ED. The patient could not be reached by phone. He was found down at home and was brought back to the ED. He still does not know that his son has died. Given this patient's dementia, I will leave this to the rest of his family.  The patient was admitted to a telemetry bed. GI was consulted and it was determined that the family did not desire for the patient to undergo invasive procedure such as EGD. GI has signed off. The patient's diet will be advanced, and his hemoglobin will be monitored. He was continued on daily PPI. His creatinine was elevated over his baseline of 1.1 at 1.66.   03/31/2020 his hemoglobin dropped to 6.7 again. He received 1 unit PRBC's in transfusion. Since the second unit, his hemoglobin has largely been stable.   The plan is for the patient to go to SNF. Palliative care has been consulted. With their assistance the family has may decide to transition to Hospice     ------------------------------------------------------------------------------------------------------------------------------------------  Subjective:  The patient was seen and examined this morning, pleasantly confused no acute distress. Hemodynamically stable. Nursing staff present at bedside. The patient and nursing staff report of no active bleeding overnight. Hemoglobin once again this morning 7.3, dropped from 8.1    A & P   Principal Problem:   Acute GI bleeding Active Problems:   Hypertension   Dementia with behavioral disturbance (HCC)   CKD (chronic kidney disease) stage 3, GFR 30-59 ml/min   Hypothyroidism   PUD (peptic ulcer disease)   Acute blood loss anemia      Acute GI bleed  -Suspected lower GI bleed -on admission frank rectal bleed -Remains hemodynamically stable -Off aspirin --not on any other anticoagulant at home -Status post transfusion of 2 unit PRBC since admission -On Protonix -Last EGD in 2019 which showed diffuse mildly erythematous mucosa of the gastric fundus and mild inflammation of the duodenum.   -Colonoscopy in 2004 showed some diverticulosis.  -GI following -pending CT of abdomen  -per previous records family does not want to pursue any invasive studies -Hemoglobin: 6.7 >>> 8.1, 8.1, 7.3 today -On oral diet -Continue to monitor.  Acute blood loss anemia  - for which patient has received 2 unit of PRBC transfusion. Continue to monitor hemoglobin.  Re-consult GI for evidence of frank and brisk bleeding.  Hgb this am was down to 7.3 this morning.  -Monitoring H&H, pending CT of abdomen this morning  B12 deficiency:  Continue the patient on oral B12 supplements.  Acute renal failure with hypokalemia  -Resolved -Creatinine improved from 1.66 to 0.96.... -Monitoring  Diabetes mellitus type 2 -CBG QA CHS, SSI coverage  Hypothyroidism -was on  IV Synthroid and change to p.o. once patient can take orally.  History of dementia -  recently started on Seroquel and Xanax.  Family was planning to get further work-up on dementia through the primary care with neurologist.    DVT Prophylaxis: SCD's CODE STATUS: DNR Family Communication: I have discussed the patient in detail with his daughter, Roger Hanson. Disposition: Patient is from home. Anticipate discharge to hospice-[facility] Barriers to discharge - no safe discharge yet due to drop in  H&H Status is: Inpatient  Remains inpatient appropriate because:Inpatient level of care appropriate due to severity of illness   Dispo: The patient is from: Home              Anticipated d/c is to: [place], Home with hospice              Anticipated d/c date is: 1 day              Patient currently is medically stable for discharge.  Status is: Inpatient  Remains inpatient appropriate because:Unsafe d/c plan     Consultants  . Gastroenterology . Palliative care  Procedures  . None  Antibiotics   Anti-infectives (From admission, onward)   None     Subjective  The patient is resting quietly. No new complaints.  Objective   Vitals:  Vitals:   04/05/20 0500 04/05/20 1254  BP: 120/70 (!) 119/48  Hanson: 98 85  Resp: 17 18  Temp: 97.9 F (36.6 C) 98.2 F (36.8 C)  SpO2: 100% 100%   Exam:   Physical Exam  Constitution:  Alert, cooperative, no distress,  Psychiatric: AA x1, pleasant, stable mood HEENT: Normocephalic, PERRL, otherwise with in Normal limits  Chest:Chest symmetric Cardio vascular:  S1/S2, RRR, No murmure, No Rubs or Gallops  pulmonary: Clear to auscultation bilaterally, respirations unlabored, negative wheezes / crackles Abdomen: Soft, non-tender, non-distended, bowel sounds,no masses, no organomegaly Muscular skeletal:  Symmetric, generalized weaknesses.  Limited exam - in bed, able to move all 4 extremities, Normal strength,  Neuro: CNII-XII intact. , normal motor and sensation, reflexes intact  Extremities: No pitting edema lower  extremities, +2 pulses  Skin: Dry, warm to touch, negative for any Rashes, No open wounds Wounds: None visible-please see nurses documentation       I have personally reviewed the following:   Today's Data  . Vitals, H&H, BMP  Scheduled Meds: . pantoprazole  40 mg Oral BID  . QUEtiapine  25 mg Oral BID   Continuous Infusions:  Principal Problem:   Acute GI bleeding Active Problems:   Hypertension   Dementia with behavioral disturbance (HCC)   CKD (chronic kidney disease) stage 3, GFR 30-59 ml/min   Hypothyroidism   PUD (peptic ulcer disease)   Acute blood loss anemia   LOS: 7 days     SIGNED: Deatra James, MD, FACP, FHM. [facility],  Pager (please use [email or web address] to page/text)  If 7PM-7AM, please contact night-coverage [email or web address] 04/06/2020, 10:25 AM

## 2020-04-06 NOTE — TOC Progression Note (Signed)
Transition of Care [facility]) - Progression Note    Patient Details  Name: ORVAN PAPADAKIS MRN: [id number] Date of Birth: [birth year]  Transition of Care [facility]) CM/SW Contact  Lakaya Tolen, [name], RN Phone Number: 04/06/2020, 9:22 AM  Clinical Narrative:Awaiting if [facility] has bed available today from rep.       Expected Discharge Plan: [place] Barriers to Discharge: Continued Medical Work up  Expected Discharge Plan and Services Expected Discharge Plan: [place]   Discharge Planning Services: CM Consult Post Acute Care Choice: [place] Living arrangements for the past 2 months: Single Family Home                                       Social Determinants of Health (SDOH) Interventions    Readmission Risk Interventions No flowsheet data found.

## 2020-04-06 NOTE — Progress Notes (Signed)
[facility] ([facility]) Hospital Liaison note.  [facility] is unable to offer a room today. Hospital Liaison will follow up tomorrow or sooner if a room becomes available.     A Please do not hesitate to call with questions.     Thank you,    Mary Anne Robertson, RN, CCM       [facility] Hospital Liaison (listed on AMION under Hospice /[facility])     [phone] 

## 2020-04-07 ENCOUNTER — Other Ambulatory Visit: Payer: Self-pay

## 2020-04-07 DIAGNOSIS — Z515 Encounter for palliative care: Secondary | ICD-10-CM

## 2020-04-07 DIAGNOSIS — F0391 Unspecified dementia with behavioral disturbance: Secondary | ICD-10-CM

## 2020-04-07 LAB — CBC
HCT: 26.6 % — ABNORMAL LOW (ref 39.0–52.0)
Hemoglobin: 7.4 g/dL — ABNORMAL LOW (ref 13.0–17.0)
MCH: 21.9 pg — ABNORMAL LOW (ref 26.0–34.0)
MCHC: 27.8 g/dL — ABNORMAL LOW (ref 30.0–36.0)
MCV: 78.7 fL — ABNORMAL LOW (ref 80.0–100.0)
Platelets: 246 10*3/uL (ref 150–400)
RBC: 3.38 MIL/uL — ABNORMAL LOW (ref 4.22–5.81)
RDW: 20.6 % — ABNORMAL HIGH (ref 11.5–15.5)
WBC: 5.5 10*3/uL (ref 4.0–10.5)
nRBC: 0 % (ref 0.0–0.2)

## 2020-04-07 MED ORDER — ALPRAZOLAM 0.25 MG PO TABS
0.2500 mg | ORAL_TABLET | Freq: Three times a day (TID) | ORAL | Status: DC | PRN
  Administered 2020-04-07 (×2): 0.25 mg via ORAL
  Filled 2020-04-07 (×2): qty 1

## 2020-04-07 MED ORDER — HALOPERIDOL LACTATE 5 MG/ML IJ SOLN: 2.0000 mg | Freq: Four times a day (QID) | INTRAMUSCULAR | Status: DC | PRN

## 2020-04-07 NOTE — Progress Notes (Signed)
PROGRESS NOTE  Roger Hanson SNK:[id number] DOB: 01-Aug-1935 DOA: 03/30/2020 PCP: Biagio Borg, MD  Brief History   This patient is an 85 yr old man  for suspected lower GI bleed due to frank rectal bleeding. He was transfused with one unit PRBC's at admission due to hemoglobin of 6.6. Hemoglobin months ago was 12.4. Will monitor Hemoglobin and hematocrit. Consult GI. He has been started on protonix. He has a history of chronic gastritis. FOBT positive. He was given a transfusion of 1 unit PRBC's.  He carries a past medical history significant for CKD, chronic gastritis, CKD, DDD, DM II, GERD, Hyperlipidemia, hypertension, hypothyroidism, Iron deficiency anemia, peripheral neuropathy, prostate cancer, restless leg syndrome, peptic ulcer disease.  It seems that the patient was initially in the ED with his son who was taken to the ED with a GI bleed due to esophageal varices. The patient left the ED and the patient's daughter took his place at the patient's son's side in the ED. The son hemorrhaged and died in the ED. The patient could not be reached by phone. He was found down at home and was brought back to the ED. He still does not know that his son has died. Given this patient's dementia, I will leave this to the rest of his family.  The patient was admitted to a telemetry bed. GI was consulted and it was determined that the family did not desire for the patient to undergo invasive procedure such as EGD. GI has signed off. The patient's diet will be advanced, and his hemoglobin will be monitored. He was continued on daily PPI. His creatinine was elevated over his baseline of 1.1 at 1.66.   03/31/2020 his hemoglobin dropped to 6.7 again. He received 1 unit PRBC's in transfusion. Since the second unit, his hemoglobin has largely been stable.   The plan is for the patient to go to SNF. Palliative care has been consulted. With their assistance the family has may decide to transition to Hospice     ------------------------------------------------------------------------------------------------------------------------------------------  Subjective:  The patient was seen and examined this morning, remained stable hemodynamically in no acute distress, awake alert pleasantly confused laying in bed.    A & P   Principal Problem:   Acute GI bleeding Active Problems:   Hypertension   Dementia with behavioral disturbance (HCC)   CKD (chronic kidney disease) stage 3, GFR 30-59 ml/min   Hypothyroidism   PUD (peptic ulcer disease)   Acute blood loss anemia      Acute GI bleed  Remained stable -no active bleeding -Suspected lower GI bleed -on admission frank rectal bleed -Remains hemodynamically stable -Off aspirin --not on any other anticoagulant at home -Status post transfusion of 2 unit PRBC since admission -On Protonix -Last EGD in 2019 which showed diffuse mildly erythematous mucosa of the gastric fundus and mild inflammation of the duodenum.   -Colonoscopy in 2004 showed some diverticulosis.  -GI following -pending CT of abdomen  -per previous records family does not want to pursue any invasive studies -Hemoglobin: 6.7 >>> 8.1, 8.1, 7.3 >> 7.4 today -On oral diet -Continue to monitor.  Acute blood loss anemia  -Stable now - for which patient has received 2 unit of PRBC transfusion. Continue to monitor hemoglobin.  Re-consult GI for evidence of frank and brisk bleeding.  Hgb this am was down to 7.3 this morning.  -Monitoring H&H, pending CT of abdomen this morning  B12 deficiency: Continue the patient on oral B12 supplements.  Acute  renal failure with hypokalemia  -Resolved -Creatinine improved from 1.66 to 0.96.... -Monitoring  Diabetes mellitus type 2 -CBG QA CHS, SSI coverage  Hypothyroidism -was on  IV Synthroid and change to p.o. once patient can take orally.  History of dementia - recently started on Seroquel and Xanax.  Family was planning to get  further work-up on dementia through the primary care with neurologist.    DVT Prophylaxis: SCD's CODE STATUS: DNR Family Communication:discussed the patient in detail with his daughter, [name]. Disposition: Patient is from home. Anticipate discharge to hospice-[facility] Barriers to discharge -discussed with social worker who also contacted her daughter, patient is medically stable to be discharged to hospice once bed available  Status is: Inpatient   Dispo: The patient is from: Home              Anticipated d/c is to: [place], Home with hospice              Anticipated d/c date is: 1 day              Patient currently is medically stable for discharge.  Status is: Inpatient  Remains inpatient appropriate because:Unsafe d/c plan     Consultants   Gastroenterology  Palliative care  Procedures   None  Antibiotics   Anti-infectives (From admission, onward)   None     Subjective  The patient is resting quietly. No new complaints.  Objective   Vitals:  Vitals:   04/06/20 1409 04/06/20 1500  BP: (!) 141/59   Pulse: 91 93  Resp: 20   Temp: 97.8 F (36.6 C)   SpO2: (!) 84% 100%    Physical Exam  Constitution:  Alert, cooperative, no distress,  Psychiatric: Pleasantly confused alert oriented x1 HEENT: Normocephalic, PERRL, otherwise with in Normal limits  Chest:Chest symmetric Cardio vascular:  S1/S2, RRR, No murmure, No Rubs or Gallops  pulmonary: Clear to auscultation bilaterally, respirations unlabored, negative wheezes / crackles Abdomen: Soft, non-tender, non-distended, bowel sounds,no masses, no organomegaly Muscular skeletal: Symmetric generalized weaknesses Limited exam - in bed, able to move all 4 extremities,  Neuro: CNII-XII intact. , normal motor and sensation, reflexes intact  Extremities: No pitting edema lower extremities, +2 pulses  Skin: Dry, warm to touch, negative for any Rashes, No open wounds Wounds: None visible please see nurses  documentation        I have personally reviewed the following:   Today's Data   Vitals, H&H, BMP  Scheduled Meds:  pantoprazole  40 mg Oral BID   QUEtiapine  25 mg Oral BID   Continuous Infusions:  Principal Problem:   Acute GI bleeding Active Problems:   Hypertension   Dementia with behavioral disturbance (HCC)   Terminal care   CKD (chronic kidney disease) stage 3, GFR 30-59 ml/min   Hypothyroidism   PUD (peptic ulcer disease)   Acute blood loss anemia   Palliative care by specialist   LOS: 8 days     SIGNED: Deatra James, MD, FACP, FHM. [facility],  Pager (please use [email or web address] to page/text)  If 7PM-7AM, please contact night-coverage [email or web address], Password [facility] 04/07/2020, 2:08 PM

## 2020-04-07 NOTE — Progress Notes (Addendum)
   Daily Progress Note   Patient Name: Roger Hanson       Date: 04/07/2020 DOB: 1935-02-19  Age: 85 y.o. MRN#: [id number] Attending Physician: Deatra James, MD Primary Care Physician: Biagio Borg, MD Admit Date: 03/30/2020  Reason for Consultation/Follow-up: Establishing goals of care  Subjective: Patient opens eyes to voice. He is oriented to name, otherwise disoriented with baseline dementia. He denies pain, shortness of breath or discomfort. He will follow simple commands. He says "yes" when asked if he is hungry. Breakfast tray has not yet arrived.   Discussed with bedside RN. Roger Hanson was awake and impulsive last night. Continued attempts to get OOB and incontinence. PRN xanax was given with minimal relief.  Will add prn haldol, this may work better than xanax or ativan. Will continue scheduled Seroquel for symptom management. No s/s of pain or discomfort during evaluation. Hgb 7.4 this AM. RN has not received report of recent rectal bleeding.   No family at bedside.   Length of Stay: 8 days  Vital Signs: BP (!) 141/59 (BP Location: Left Arm)   Pulse 93   Temp 97.8 F (36.6 C) (Oral)   Resp 20   Ht 5' 8.5" (1.74 m)   Wt 51.9 kg   SpO2 100%   BMI 17.14 kg/m  SpO2: SpO2: 100 % O2 Device: O2 Device: Room Air O2 Flow Rate:    Physical Exam: -awake, alert to self. Otherwise disoriented with baseline dementia.  -normal breathing pattern -will follow some simple commands            Palliative Care Assessment & Plan    Code Status:  DNR  Goals of Care/Recommendations:  Continue comfort care measures  Continue comfort feeds per patient/family request.  Add prn IV Haldol. Continue scheduled Seroquel. PRN IV/SL morphine for pain/dyspnea  Pending [facility] hospice bed availability  PMT will continue to support and follow as needed. Please call team line with urgent needs   Prognosis: poor long-term prognosis with dementia, declining  functional/cognitive/nutritional status. GI bleeding with anemia. Family does not wish to pursue aggressive workup.   Discharge Planning: Hospice facility  Thank you for allowing the Palliative Medicine Team to assist in the care of this patient.  Time Total: 68min  Greater than 50% of this time was spent counseling and coordinating care related to the above assessment and plan.   Ihor Dow, DNP, FNP-C Palliative Medicine Team  Phone: [phone] Fax: [phone]

## 2020-04-07 NOTE — Progress Notes (Signed)
[facility] ([facility]) Hospital Liaison note.    Unfortunately, [facility] is unable to offer a room today. Hospital Liaison will follow up tomorrow or sooner if a room becomes available.    Please do not hesitate to call with questions.  Thank you for the opportunity to participate in this patient's care.  Chrislyn King, BSN, RN [facility] Hospital Liaison (listed on AMION under Hospice/[facility])    [phone]     

## 2020-04-07 NOTE — TOC Progression Note (Signed)
Transition of Care [facility]) - Progression Note    Patient Details  Name: Roger Hanson MRN: [id number] Date of Birth: 1935/05/07  Transition of Care [facility]) CM/SW Contact  Shirlette Scarber, [name], RN Phone Number: 04/07/2020, 10:13 AM  Clinical Narrative: Noted [facility] no beds available today. Spoke to dtr [name]-offered other options of Hospice facilities-she is content on waiting on a bed @ [facility] appreciative of services.      Expected Discharge Plan: [place] Barriers to Discharge: Continued Medical Work up  Expected Discharge Plan and Services Expected Discharge Plan: [place]   Discharge Planning Services: CM Consult Post Acute Care Choice: [place] Living arrangements for the past 2 months: Single Family Home                                       Social Determinants of Health (SDOH) Interventions    Readmission Risk Interventions No flowsheet data found.

## 2020-04-07 NOTE — Care Management Important Message (Signed)
Important Message  Patient Details IM Letter given to Dessa Phi RN Case Manager to present to the Patient Name: Roger Hanson MRN: [id number] Date of Birth: [birth year]   Medicare Important Message Given:  Yes     [name] 04/07/2020, 10:07 AM

## 2020-04-07 NOTE — Patient Outreach (Signed)
[facility]) Care Management  04/07/2020  Roger Hanson [birth year] [id number]   Patient currently hospitalized under comfort care.  Waiting bed placement to [facility].  Plan: RN CM will continue to follow hospitalization.    [name], RN, MSN [place] Management Care Management Coordinator Direct Line [phone] Cell [phone] Toll Free: [phone]  Fax: [phone]

## 2020-04-08 ENCOUNTER — Other Ambulatory Visit: Payer: Self-pay

## 2020-04-08 ENCOUNTER — Ambulatory Visit: Payer: Self-pay

## 2020-04-08 LAB — CBC
HCT: 28.8 % — ABNORMAL LOW (ref 39.0–52.0)
Hemoglobin: 8.2 g/dL — ABNORMAL LOW (ref 13.0–17.0)
MCH: 22.3 pg — ABNORMAL LOW (ref 26.0–34.0)
MCHC: 28.5 g/dL — ABNORMAL LOW (ref 30.0–36.0)
MCV: 78.5 fL — ABNORMAL LOW (ref 80.0–100.0)
Platelets: 270 10*3/uL (ref 150–400)
RBC: 3.67 MIL/uL — ABNORMAL LOW (ref 4.22–5.81)
RDW: 20.4 % — ABNORMAL HIGH (ref 11.5–15.5)
WBC: 4.8 10*3/uL (ref 4.0–10.5)
nRBC: 0 % (ref 0.0–0.2)

## 2020-04-08 MED ORDER — BIOTENE DRY MOUTH MT LIQD: 15.0000 mL | OROMUCOSAL | 0 refills | Status: AC | PRN

## 2020-04-08 MED ORDER — ONDANSETRON HCL 4 MG PO TABS: 4.0000 mg | ORAL_TABLET | Freq: Four times a day (QID) | ORAL | 0 refills | Status: AC | PRN

## 2020-04-08 MED ORDER — ALPRAZOLAM 0.25 MG PO TABS: 0.2500 mg | ORAL_TABLET | Freq: Three times a day (TID) | ORAL | 0 refills | Status: AC | PRN

## 2020-04-08 MED ORDER — PANTOPRAZOLE SODIUM 40 MG PO TBEC: 40.0000 mg | DELAYED_RELEASE_TABLET | Freq: Two times a day (BID) | ORAL | 0 refills | Status: AC

## 2020-04-08 MED ORDER — POLYVINYL ALCOHOL 1.4 % OP SOLN: 1.0000 [drp] | Freq: Four times a day (QID) | OPHTHALMIC | 0 refills | Status: AC | PRN

## 2020-04-08 NOTE — Discharge Summary (Signed)
Physician Discharge Summary [facility]    Patient: Roger Hanson                   Admit date: 03/30/2020   DOB: 10-13-1934             Discharge date:04/08/2020/11:06 AM NLZ:[id number]                          PCP: Roger Borg, MD  Disposition: Hospice/[facility]  Recommendations for Outpatient Follow-up:   Follow up: in 1 day  -with hospice  Discharge Condition: Stable   Code Status:   Code Status: DNR  Diet recommendation: Regular healthy diet   Discharge Diagnoses:    Principal Problem:   Acute GI bleeding Active Problems:   Hypertension   Dementia with behavioral disturbance ([place])   Terminal care   CKD (chronic kidney disease) stage 3, GFR 30-59 ml/min   Hypothyroidism   PUD (peptic ulcer disease)   Acute blood loss anemia   Palliative care by specialist   History of Present Illness/ Hospital Course Roger Hanson Summary:   This patient is an 85 yr old man  for suspected lower GI bleed due to frank rectal bleeding. He was transfused with one unit PRBC's at admission due to hemoglobin of 6.6. Hemoglobin months ago was 12.4. Will monitor Hemoglobin and hematocrit. Consult GI. He has been started on protonix. He has a history of chronic gastritis. FOBT positive. He was given a transfusion of 1 unit PRBC's.  He carries a past medical history significant for CKD, chronic gastritis, CKD, DDD, DM II, GERD, Hyperlipidemia, hypertension, hypothyroidism, Iron deficiency anemia, peripheral neuropathy, prostate cancer, restless leg syndrome, peptic ulcer disease.  It seems that the patient was initially in the ED with his son who was taken to the ED with a GI bleed due to esophageal varices. The patient left the ED and the patient's daughter took his place at the patient's son's side in the ED. The son hemorrhaged and died in the ED. The patient could not be reached by phone. He was found down at home and was brought back to the ED. He still does not know that his son  has died. Given this patient's dementia, I will leave this to the rest of his family.  The patient was admitted to a telemetry bed. GI was consulted and it was determined that the family did not desire for the patient to undergo invasive procedure such as EGD. GI has signed off. The patient's diet will be advanced, and his hemoglobin will be monitored. He was continued on daily PPI. His creatinine was elevated over his baseline of 1.1 at 1.66.   03/31/2020 his hemoglobin dropped to 6.7 again. He received 1 unit PRBC's in transfusion. Since the second unit, his hemoglobin has largely been stable.   The plan is for the patient to go to SNF. Palliative care has been consulted. With their assistance the family has may decide to transition to Hospice      Acute GI bleed Remained stable -no active bleeding -Suspected lower GI bleed -on admission frank rectal bleed -Remains hemodynamically stable -Off aspirin --not on any other anticoagulant at home -Status post transfusion of 2 unit PRBC since admission -On Protonix -Last EGD in 2019 which showed diffuse mildly erythematous mucosa of the gastric fundus and mild inflammation of the duodenum.  -Colonoscopy in 2004 showed some diverticulosis.  -GI following -pending CT of abdomen  -  per previous records family does not want to pursue any invasive studies -Hemoglobin: 6.7 >>> 8.1, 8.1, 7.3 >> 7.4 >> 8.2 today -On oral diet -Continue to monitor.  Acute blood loss anemia  -Stable now - for which patient has received 2 unit of PRBC transfusion. Continue to monitor hemoglobin.  Re-consult GI for evidence of frank and brisk bleeding.  Hgb this am was down to 7.3 this morning.  -Monitoring H&H, pending CT of abdomen this morning  B12 deficiency: Continue the patient on oral B12 supplements.  Acute renal failure with hypokalemia  -Resolved -Creatinine improved from 1.66 to 0.96.... -Monitoring  Diabetes mellitus type 2 -CBG QA CHS, SSI  coverage  Hypothyroidism -was on  IV Synthroid and change to p.o. once patient can take orally.  History of dementia - recently started on Seroquel and Xanax. Family was planning to get further work-up on dementia through the primary care with neurologist.    CODE STATUS: DNR Family Communication:discussed the patient in detail with his daughter, Roger Hanson. Disposition: Patient is from home. Anticipate discharge to hospice-[facility]    Discharge Instructions:   Discharge Instructions    Activity as tolerated - No restrictions   Complete by: As directed    Diet - low sodium heart healthy   Complete by: As directed    Discharge instructions   Complete by: As directed    Follow-up with hospice care   Increase activity slowly   Complete by: As directed        Medication List    STOP taking these medications   Belsomra 5 MG Tabs Generic drug: Suvorexant   citalopram 10 MG tablet Commonly known as: CeleXA   donepezil 10 MG tablet Commonly known as: Aricept   furosemide 20 MG tablet Commonly known as: Lasix   NEXIUM PO   pioglitazone 15 MG tablet Commonly known as: ACTOS   pravastatin 80 MG tablet Commonly known as: PRAVACHOL   QUEtiapine 25 MG tablet Commonly known as: SEROquel     TAKE these medications   Accu-Chek Aviva Plus test strip Generic drug: glucose blood CHECK GLUCOSES ONCE DAILY E11.9   Accu-Chek Aviva Plus w/Device Kit Apply 1 Device topically daily. Once daily E11.9   Accu-Chek Aviva Soln 1 Device by In Vitro route daily. E11.9   Accu-Chek Softclix Lancets lancets Use as instructed once daily E11.9   ALPRAZolam 0.25 MG tablet Commonly known as: XANAX Take 1 tablet (0.25 mg total) by mouth 3 (three) times daily as needed for up to 10 days for anxiety. What changed: when to take this   antiseptic oral rinse Liqd Apply 15 mLs topically as needed for dry mouth.   B-D SINGLE USE SWABS REGULAR Pads Apply 1 Device topically daily.  E11.9   levothyroxine 100 MCG tablet Commonly known as: SYNTHROID TAKE 1 TABLET BY MOUTH EVERYDAY AT BEDTIME What changed: See the new instructions.   ondansetron 4 MG tablet Commonly known as: ZOFRAN Take 1 tablet (4 mg total) by mouth every 6 (six) hours as needed for nausea. What changed:   when to take this  reasons to take this   pantoprazole 40 MG tablet Commonly known as: PROTONIX Take 1 tablet (40 mg total) by mouth 2 (two) times daily for 15 days. What changed: when to take this   polyvinyl alcohol 1.4 % ophthalmic solution Commonly known as: LIQUIFILM TEARS Place 1 drop into both eyes 4 (four) times daily as needed for dry eyes.   Tradjenta 5 MG Tabs  tablet Generic drug: linagliptin Take 1 tablet (5 mg total) by mouth every morning.       Allergies  Allergen Reactions  . Glipizide Other (See Comments)    Erythema multiforme  . Lovastatin     Erythema multiforme  . Omeprazole Nausea And Vomiting  . Protonix [Pantoprazole Sodium] Nausea Only     Procedures /Studies:   CT HEAD WO CONTRAST  Result Date: 03/30/2020 CLINICAL DATA:  Stroke, follow-up. EXAM: CT HEAD WITHOUT CONTRAST TECHNIQUE: Contiguous axial images were obtained from the base of the skull through the vertex without intravenous contrast. COMPARISON:  Brain MRI 11/12/2016 FINDINGS: Brain: Stable, moderate generalized parenchymal atrophy. There is mild ill-defined hypoattenuation within the cerebral white matter which is nonspecific, but consistent with chronic small vessel ischemic disease. There is no acute intracranial hemorrhage. No demarcated cortical infarct is identified. No extra-axial fluid collection. No evidence of intracranial mass. No midline shift. Vascular: No hyperdense vessel.  Atherosclerotic calcifications. Skull: Normal. Negative for fracture or focal lesion. Sinuses/Orbits: Visualized orbits show no acute finding. Mild ethmoid and maxillary sinus mucosal thickening. Small left  maxillary sinus mucous retention cyst. No significant mastoid effusion. IMPRESSION: No CT evidence of acute intracranial abnormality. Stable moderate generalized parenchymal atrophy and mild chronic small vessel ischemic disease. Mild ethmoid and maxillary sinus mucosal thickening. Small left maxillary sinus mucous retention cyst. Electronically Signed   By: Kellie Simmering DO   On: 03/30/2020 18:24    Subjective:   Patient was seen and examined 04/08/2020, 11:06 AM Patient stable today. No acute distress.  No issues overnight Stable for discharge.  Discharge Exam:    Vitals:   04/05/20 0500 04/05/20 1254 04/06/20 1409 04/06/20 1500  BP: 120/70 (!) 119/48 (!) 141/59   Hanson: 98 85 91 93  Resp: _0 Temp: 97.9 F (36.6 C) 98.2 F (36.8 C) 97.8 F (36.6 C)   TempSrc: Oral Oral Oral   SpO2: 100% 100% (!) 84% 100%  Weight:      Height:        General: Pt lying comfortably in bed & appears in no obvious distress. Cardiovascular: S1 & S2 heard, RRR, S1/S2 +. No murmurs, rubs, gallops or clicks. No JVD or pedal edema. Respiratory: Clear to auscultation without wheezing, rhonchi or crackles. No increased work of breathing. Abdominal:  Non-distended, non-tender & soft. No organomegaly or masses appreciated. Normal bowel sounds heard. CNS: Alert and oriented. No focal deficits. Extremities: no edema, no cyanosis    The results of significant diagnostics from this hospitalization (including imaging, microbiology, ancillary and laboratory) are listed below for reference.      Microbiology:   Recent Results (from the past 240 hour(s))  SARS Coronavirus 2 by RT PCR (hospital order, performed in [facility] hospital lab) Nasopharyngeal Nasopharyngeal Swab     Status: None   Collection Time: 03/30/20  4:00 AM   Specimen: Nasopharyngeal Swab  Result Value Ref Range Status   SARS Coronavirus 2 NEGATIVE NEGATIVE Final    Comment: (NOTE) SARS-CoV-2 target nucleic acids are NOT  DETECTED.  The SARS-CoV-2 RNA is generally detectable in upper and lower respiratory specimens during the acute phase of infection. The lowest concentration of SARS-CoV-2 viral copies this assay can detect is 250 copies / mL. A negative result does not preclude SARS-CoV-2 infection and should not be used as the sole basis for treatment or other patient management decisions.  A negative result may occur with improper specimen collection / handling, submission of  specimen other than nasopharyngeal swab, presence of viral mutation(s) within the areas targeted by this assay, and inadequate number of viral copies (<250 copies / mL). A negative result must be combined with clinical observations, patient history, and epidemiological information.  Fact Sheet for Patients:   StrictlyIdeas.no  Fact Sheet for Healthcare Providers: BankingDealers.co.za  This test is not yet approved or  cleared by the [place] FDA and has been authorized for detection and/or diagnosis of SARS-CoV-2 by FDA under an Emergency Use Authorization (EUA).  This EUA will remain in effect (meaning this test can be used) for the duration of the COVID-19 declaration under Section 564(b)(1) of the Act, 21 U.S.C. section 360bbb-3(b)(1), unless the authorization is terminated or revoked sooner.  Performed at [facility], [place] [address], [place], [place] [address]      Labs:   CBC: Recent Labs  Lab 04/04/20 1537 04/04/20 2308 04/05/20 0717 04/07/20 0502 04/08/20 0511  WBC  --   --  8.6 5.5 4.8  HGB 8.1* 8.1* 7.3* 7.4* 8.2*  HCT 28.2* 28.0* 25.5* 26.6* 28.8*  MCV  --   --  78.5* 78.7* 78.5*  PLT  --   --  237 246 224   Basic Metabolic Panel: Recent Labs  Lab 04/04/20 0006 04/05/20 0717  NA 137 141  K 4.0 3.9  CL 104 109  CO2 23 25  GLUCOSE 173* 169*  BUN 30* 23  CREATININE 0.98 0.96  CALCIUM 8.8* 9.1   Liver Function Tests: No  results for input(s): AST, ALT, ALKPHOS, BILITOT, PROT, ALBUMIN in the last 168 hours. BNP (last 3 results) No results for input(s): BNP in the last 8760 hours. Cardiac Enzymes: No results for input(s): CKTOTAL, CKMB, CKMBINDEX, TROPONINI in the last 168 hours. CBG: Recent Labs  Lab 04/04/20 2018 04/05/20 0002 04/05/20 0406 04/05/20 0738 04/05/20 1120  GLUCAP 150* 219* 193* 133* 165*   Hgb A1c No results for input(s): HGBA1C in the last 72 hours. Lipid Profile No results for input(s): CHOL, HDL, LDLCALC, TRIG, CHOLHDL, LDLDIRECT in the last 72 hours. Thyroid function studies No results for input(s): TSH, T4TOTAL, T3FREE, THYROIDAB in the last 72 hours.  Invalid input(s): FREET3 Anemia work up No results for input(s): VITAMINB12, FOLATE, FERRITIN, TIBC, IRON, RETICCTPCT in the last 72 hours. Urinalysis    Component Value Date/Time   COLORURINE YELLOW 03/30/2020 0224   APPEARANCEUR CLEAR 03/30/2020 0224   LABSPEC 1.015 03/30/2020 0224   PHURINE 6.0 03/30/2020 0224   GLUCOSEU 50 (A) 03/30/2020 0224   GLUCOSEU NEGATIVE 07/16/2019 1456   HGBUR NEGATIVE 03/30/2020 0224   BILIRUBINUR NEGATIVE 03/30/2020 0224   KETONESUR NEGATIVE 03/30/2020 0224   PROTEINUR NEGATIVE 03/30/2020 0224   UROBILINOGEN 0.2 07/16/2019 1456   NITRITE NEGATIVE 03/30/2020 0224   LEUKOCYTESUR NEGATIVE 03/30/2020 0224         Time coordinating discharge: Over 45 minutes  SIGNED: Deatra James, MD, FACP, FHM. [facility],  Please use [email or web address] to Page If 7PM-7AM, please contact night-coverage [email or web address], Password [facility] 04/08/2020, 11:06 AM

## 2020-04-08 NOTE — Patient Outreach (Signed)
[facility]) Care Management  04/08/2020  Roger Hanson Jan 22, 1935 [id number]   Patient to be moved to [facility] today for comfort care.   Plan: RN CM will close case and send appropriate letters.   [name], RN, MSN [place] Management Care Management Coordinator Direct Line [phone] Cell [phone] Toll Free: [phone]  Fax: [phone]

## 2020-04-08 NOTE — Progress Notes (Signed)
Manufacturing engineer [facility]) Hospital Liaison note.    Chart reviewed and eligibility confirmed. Spoke with daughter [name] to confirm interest and offer a bed for today.  Family agreeable to transfer today. TOC aware.    [facility] will notify TOC when registration paperwork has been completed to arrange transport.   RN please call report to [phone].  Thank you for the opportunity to participate in this patients care.  Chrislyn Edison Pace, BSN, RN [place] (listed on [place] under Hospice/[facility])    [phone]

## 2020-04-08 NOTE — TOC Transition Note (Signed)
Transition of Care [facility]) - CM/SW Discharge Note   Patient Details  Name: Roger Hanson MRN: [id number] Date of Birth: 01-09-1935  Transition of Care [facility]) CM/SW Contact:  Dessa Phi, RN Phone Number: 04/08/2020, 3:00 PM   Clinical Narrative:  PTAR called.     Final next level of care: [place] Barriers to Discharge: No Barriers Identified   Patient Goals and CMS Choice Patient states their goals for this hospitalization and ongoing recovery are:: get well CMS Medicare.gov Compare Post Acute Care list provided to:: Patient Represenative (must comment) (dtr Roger Hanson) Choice offered to / list presented to : Adult Children  Discharge Placement              Patient chooses bed at:  [facility]) Patient to be transferred to facility by: [place] Name of family member notified: Roger Hanson dtr [phone] Patient and family notified of of transfer: 04/08/20  Discharge Plan and Services   Discharge Planning Services: CM Consult Post Acute Care Choice: [place]                               Social Determinants of Health (SDOH) Interventions     Readmission Risk Interventions No flowsheet data found.

## 2020-04-08 NOTE — Progress Notes (Signed)
Manufacturing engineer ([facility])  Consents complete.  TOC Kathy made aware.  Please call for transport.  RN, please call report to [phone]. Please send DNR with pt.  Thank you for the opportunity to participate in this pt's care.  Domenic Moras, BSN, RN [facility] Liaison [phone] (24h on call)

## 2020-04-08 NOTE — TOC Transition Note (Signed)
Transition of Care [facility]) - CM/SW Discharge Note   Patient Details  Name: Roger Hanson MRN: [id number] Date of Birth: [birth year]  Transition of Care [facility]) CM/SW Contact:  Dessa Phi, RN Phone Number: 04/08/2020, 10:43 AM   Clinical Narrative: d/c today to [facility]. PTAR for transport.Nsg tel# for report-[phone]. No further CM needs.      Final next level of care: [place] Barriers to Discharge: No Barriers Identified   Patient Goals and CMS Choice Patient states their goals for this hospitalization and ongoing recovery are:: get well CMS Medicare.gov Compare Post Acute Care list provided to:: Patient Represenative (must comment) (dtr [name]) Choice offered to / list presented to : Adult Children  Discharge Placement              Patient chooses bed at:  [facility]) Patient to be transferred to facility by: [place] Name of family member notified: [name] dtr [phone] Patient and family notified of of transfer: 04/08/20  Discharge Plan and Services   Discharge Planning Services: CM Consult Post Acute Care Choice: [place]                               Social Determinants of Health (SDOH) Interventions     Readmission Risk Interventions No flowsheet data found.

## 2020-04-17 DEATH — deceased

## 2020-07-08 ENCOUNTER — Ambulatory Visit: Payer: Medicare HMO | Admitting: Internal Medicine
